# Patient Record
Sex: Male | Born: 1980 | Race: Black or African American | Hispanic: No | Marital: Married | Smoking: Former smoker
Health system: Southern US, Community
[De-identification: ages and names within clinical notes are randomized; demographics above are authoritative.]

## PROBLEM LIST (undated history)

## (undated) DIAGNOSIS — I119 Hypertensive heart disease without heart failure: Secondary | ICD-10-CM

## (undated) DIAGNOSIS — E785 Hyperlipidemia, unspecified: Secondary | ICD-10-CM

## (undated) DIAGNOSIS — I43 Cardiomyopathy in diseases classified elsewhere: Secondary | ICD-10-CM

## (undated) DIAGNOSIS — I10 Essential (primary) hypertension: Secondary | ICD-10-CM

## (undated) DIAGNOSIS — I77810 Thoracic aortic ectasia: Secondary | ICD-10-CM

## (undated) DIAGNOSIS — R7303 Prediabetes: Secondary | ICD-10-CM

## (undated) HISTORY — PX: HERNIA REPAIR: SHX51

## (undated) HISTORY — DX: Thoracic aortic ectasia: I77.810

## (undated) HISTORY — DX: Prediabetes: R73.03

---
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
## 2000-11-17 ENCOUNTER — Emergency Department (HOSPITAL_COMMUNITY): Admission: EM | Admit: 2000-11-17 | Discharge: 2000-11-17 | Payer: Self-pay | Admitting: Emergency Medicine

## 2000-11-17 ENCOUNTER — Encounter: Payer: Self-pay | Admitting: Emergency Medicine

## 2003-02-11 ENCOUNTER — Emergency Department (HOSPITAL_COMMUNITY): Admission: EM | Admit: 2003-02-11 | Discharge: 2003-02-11 | Payer: Self-pay | Admitting: Emergency Medicine

## 2004-02-03 ENCOUNTER — Emergency Department (HOSPITAL_COMMUNITY): Admission: EM | Admit: 2004-02-03 | Discharge: 2004-02-03 | Payer: Self-pay | Admitting: Emergency Medicine

## 2004-09-02 ENCOUNTER — Emergency Department (HOSPITAL_COMMUNITY): Admission: EM | Admit: 2004-09-02 | Discharge: 2004-09-02 | Payer: Self-pay | Admitting: Emergency Medicine

## 2004-12-21 ENCOUNTER — Emergency Department (HOSPITAL_COMMUNITY): Admission: EM | Admit: 2004-12-21 | Discharge: 2004-12-21 | Payer: Self-pay | Admitting: Emergency Medicine

## 2006-11-28 ENCOUNTER — Emergency Department (HOSPITAL_COMMUNITY): Admission: EM | Admit: 2006-11-28 | Discharge: 2006-11-29 | Payer: Self-pay | Admitting: Emergency Medicine

## 2007-02-14 ENCOUNTER — Ambulatory Visit (HOSPITAL_BASED_OUTPATIENT_CLINIC_OR_DEPARTMENT_OTHER): Admission: RE | Admit: 2007-02-14 | Discharge: 2007-02-14 | Payer: Self-pay | Admitting: Surgery

## 2008-08-29 ENCOUNTER — Emergency Department (HOSPITAL_COMMUNITY): Admission: EM | Admit: 2008-08-29 | Discharge: 2008-08-29 | Payer: Self-pay | Admitting: Emergency Medicine

## 2008-09-02 ENCOUNTER — Emergency Department (HOSPITAL_COMMUNITY): Admission: EM | Admit: 2008-09-02 | Discharge: 2008-09-02 | Payer: Self-pay | Admitting: Emergency Medicine

## 2009-03-22 ENCOUNTER — Emergency Department (HOSPITAL_COMMUNITY): Admission: EM | Admit: 2009-03-22 | Discharge: 2009-03-22 | Payer: Self-pay | Admitting: Emergency Medicine

## 2009-11-07 ENCOUNTER — Emergency Department (HOSPITAL_COMMUNITY): Admission: EM | Admit: 2009-11-07 | Discharge: 2009-11-07 | Payer: Self-pay | Admitting: Emergency Medicine

## 2009-11-10 ENCOUNTER — Ambulatory Visit: Payer: Self-pay | Admitting: Internal Medicine

## 2009-11-10 DIAGNOSIS — N410 Acute prostatitis: Secondary | ICD-10-CM | POA: Insufficient documentation

## 2009-11-10 DIAGNOSIS — I10 Essential (primary) hypertension: Secondary | ICD-10-CM | POA: Insufficient documentation

## 2009-11-10 LAB — CONVERTED CEMR LAB
AST: 23 units/L (ref 0–37)
Albumin: 4.3 g/dL (ref 3.5–5.2)
Alkaline Phosphatase: 80 units/L (ref 39–117)
Basophils Absolute: 0.1 10*3/uL (ref 0.0–0.1)
Bilirubin, Direct: 0.1 mg/dL (ref 0.0–0.3)
Calcium: 9.3 mg/dL (ref 8.4–10.5)
Creatinine, Ser: 1.1 mg/dL (ref 0.4–1.5)
Eosinophils Absolute: 0.3 10*3/uL (ref 0.0–0.7)
GFR calc non Af Amer: 102.54 mL/min (ref >60)
Hemoglobin: 16.4 g/dL (ref 13.0–17.0)
Ketones, ur: NEGATIVE mg/dL
Lymphocytes Relative: 30.6 % (ref 12.0–46.0)
MCHC: 34.4 g/dL (ref 30.0–36.0)
Monocytes Relative: 12.3 % — ABNORMAL HIGH (ref 3.0–12.0)
Neutro Abs: 2.6 10*3/uL (ref 1.4–7.7)
Neutrophils Relative %: 50.3 % (ref 43.0–77.0)
RBC: 5.17 M/uL (ref 4.22–5.81)
RDW: 12.5 % (ref 11.5–14.6)
Sodium: 142 meq/L (ref 135–145)
Specific Gravity, Urine: 1.015 (ref 1.000–1.030)
Urine Glucose: NEGATIVE mg/dL
pH: 6 (ref 5.0–8.0)

## 2010-01-19 ENCOUNTER — Ambulatory Visit: Payer: Self-pay | Admitting: Internal Medicine

## 2011-03-07 LAB — GC/CHLAMYDIA PROBE AMP, GENITAL
Chlamydia, DNA Probe: NEGATIVE
GC Probe Amp, Genital: NEGATIVE

## 2011-04-21 NOTE — Op Note (Signed)
Steve Carrillo, Steve Carrillo                ACCOUNT NO.:  [id number]   MEDICAL RECORD NO.:  [id number]          PATIENT TYPE:  AMB   LOCATION:  DSC                          FACILITY:  [facility]   PHYSICIAN:  Thomas A. Cornett, M.D.DATE OF BIRTH:  1981-01-09   DATE OF PROCEDURE:  02/14/2007  DATE OF DISCHARGE:  02/14/2007                               OPERATIVE REPORT   PREOPERATIVE DIAGNOSIS:  Right inguinal scrotal hernia.   POSTOPERATIVE DIAGNOSIS:  Right inguinal scrotal hernia.   OPERATION/PROCEDURE:  Repair of right inguinal scrotal hernia with mesh.   SURGEON:  Harriette Bouillon, M.D.   ANESTHESIA:  LMA with approximately 40 mL of 0.25% Sensorcaine local.   ESTIMATED BLOOD LOSS:  20 mL.   SPECIMENS:  None.   DRAINS:  None.   INDICATIONS FOR PROCEDURE:  The patient is 30 year old male with a very  large reducible inguinal scrotal hernia.  He presents today for repair  after receiving informed consent from him.   DESCRIPTION OF PROCEDURE:  The patient was brought to the operating room  and placed supine.  After induction of general anesthesia, the right  inguinal region was prepped and draped in a sterile fashion.  A right  groin oblique incision was made and dissection was carried down through  the subcutaneous tissue to the aponeurosis of the external oblique.  This was identified and infiltrated with additional 0.25% Sensorcaine.  It was opened in the direction of its fibers to expose the cord  structures.  The patient had a significantly large ilioinguinal nerve.  This was identified.  I was able to dissect of the cord structures and  sac.  A half inch Penrose drain was placed around these.  I was then  able to dissect the sac off of the cord structures.  The sac extended  well down into the scrotal sac.  I was able to pull up the testicle to  do this and then replaced testicle back in the scrotum without  difficulty.  Once the entire sac was dissected out, there was some  omentum in this.  I was able to manually reduce this all back into the  abdominal cavity without difficulty.  I then used a large Prolene hernia  system placing the inner leaflet in the preperitoneal space, inlaying  the [place] flat on the floor of the inguinal canal.  I secured the mesh  with a 0 Vicryl to the pubic tubercle and the bridge portion of the mesh  to the internal oblique.  #1 Novofils were used to secure the mesh to  the floor of the inguinal canal.  A small slit was cut for the cord  structures and this was closed using #1 Novofil as well.  Once the mesh  was secured, I noticed the ilioinguinal nerve was lying up against the  suture and I was unable to move this suture  satisfactorily, so that I  elected to divide the ilioinguinal nerve given the fact that I knew he  would probably have some pain given the way the mesh and nerve laid  together with the sutures around  it.  At this point in time irrigation  was used and suctioned out.  I closed the aponeurosis of the external  oblique with a 2-  0 Vicryl and 3-0 Vicryl was used to close Scarpa's fascia and 4-0  Monocryl was used to close the skin in a subcuticular fashion.  All  final counts of sponge, needle and instruments were found be correct in  this portion case.  Sterile dressings were applied.  The patient was  awakened and taken to recovery in satisfactory condition.      Thomas A. Cornett, M.D.  Electronically Signed     TAC/MEDQ  D:  02/14/2007  T:  02/16/2007  Job:  [id number]

## 2011-10-30 ENCOUNTER — Emergency Department (HOSPITAL_COMMUNITY)
Admission: EM | Admit: 2011-10-30 | Discharge: 2011-10-31 | Disposition: A | Discharge status: Home / Self Care | Payer: BC Managed Care – PPO | Attending: Emergency Medicine | Admitting: Emergency Medicine

## 2011-10-30 DIAGNOSIS — T148XXA Other injury of unspecified body region, initial encounter: Secondary | ICD-10-CM

## 2011-10-30 DIAGNOSIS — M25519 Pain in unspecified shoulder: Secondary | ICD-10-CM

## 2011-10-30 DIAGNOSIS — I10 Essential (primary) hypertension: Secondary | ICD-10-CM | POA: Insufficient documentation

## 2011-10-30 DIAGNOSIS — X503XXA Overexertion from repetitive movements, initial encounter: Secondary | ICD-10-CM | POA: Insufficient documentation

## 2011-10-30 HISTORY — DX: Essential (primary) hypertension: I10

## 2011-10-30 NOTE — ED Notes (Signed)
Pt complains of right shoulder pain that radiates down his arm, he describes it as numb and throbbing

## 2011-10-31 ENCOUNTER — Encounter (HOSPITAL_COMMUNITY): Payer: Self-pay | Admitting: Emergency Medicine

## 2011-10-31 ENCOUNTER — Other Ambulatory Visit: Payer: Self-pay

## 2011-10-31 MED ORDER — HYDROCHLOROTHIAZIDE 25 MG PO TABS: 12.5000 mg | ORAL_TABLET | Freq: Every day | ORAL | Status: DC

## 2011-10-31 MED ORDER — HYDROCODONE-ACETAMINOPHEN 5-325 MG PO TABS: 2.0000 | ORAL_TABLET | ORAL | Status: AC | PRN

## 2011-10-31 MED ORDER — HYDROCODONE-ACETAMINOPHEN 5-325 MG PO TABS
1.0000 | ORAL_TABLET | Freq: Once | ORAL | Status: AC
  Administered 2011-10-31: 1 via ORAL
  Filled 2011-10-31: qty 1

## 2011-10-31 NOTE — ED Provider Notes (Signed)
History     CSN: [id number] Arrival date & time: 10/30/2011  5:27 PM   First MD Initiated Contact with Patient 10/31/11 0014      Chief Complaint  Patient presents with  . Shoulder Pain    HPI  History provided by the patient and significant other. Patient complains of right shoulder pain radiating to his biceps and elbow that began yesterday. Patient states that he was throwing a football around a lot on Saturday prior to symptoms. Denies any other trauma or injury to the arm. Pt tried using a OTC skin pain patch without much improvement.  Pain is worse with movement.  He has some tingling in arm and numbness but denies other symptoms. He denies any swelling or bruising. Pt has hx of HTN but reports not being on any medications.  Pt has no other significant PMH.     Past Medical History  Diagnosis Date  . Hypertension     History reviewed. No pertinent past surgical history.  History reviewed. No pertinent family history.  History  Substance Use Topics  . Smoking status: Not on file  . Smokeless tobacco: Not on file  . Alcohol Use:       Review of Systems  Constitutional: Negative for fever, chills and diaphoresis.  Respiratory: Negative for cough and shortness of breath.   Cardiovascular: Negative for chest pain.  Gastrointestinal: Negative for nausea and vomiting.  All other systems reviewed and are negative.    Allergies  Review of patient's allergies indicates no known allergies.  Home Medications  No current outpatient prescriptions on file.  BP 188/127  Pulse 79  Temp(Src) 98 F (36.7 C) (Oral)  Resp 20  SpO2 98%  Physical Exam  Nursing note and vitals reviewed. Constitutional: He is oriented to person, place, and time. He appears well-developed and well-nourished. No distress.  HENT:  Head: Normocephalic.  Cardiovascular: Normal rate, regular rhythm and normal heart sounds.   No murmur heard. Pulmonary/Chest: Effort normal and breath sounds  normal. He has no wheezes. He has no rales.  Abdominal: Soft.  Musculoskeletal: Normal range of motion.       Pain with extreme ranges of motion and right shoulder otherwise full. No deformity or swelling of shoulder. Normal strength in arm. Normal radial pulses and distal sensations.  Neurological: He is alert and oriented to person, place, and time. No cranial nerve deficit.  Skin: Skin is warm and dry. No rash noted. No erythema.  Psychiatric: He has a normal mood and affect. His behavior is normal.    ED Course  Procedures (including critical care time)   1. Shoulder pain   2. Muscle strain   3. Hypertension     MDM  12:15 AM patient seen and evaluated. Patient in no acute distress.  Pt with HTN on exam but has no other symptoms besides rt shoulder pain.  Will plan to give Rx for BP meds for pt to start.  Pt encouraged to follow with PCP.          Angus Seller, [place] 10/31/11 1549

## 2011-11-01 NOTE — ED Provider Notes (Signed)
Medical screening examination/treatment/procedure(s) were performed by non-physician practitioner and as supervising physician I was immediately available for consultation/collaboration.   Lyanne Co, MD 11/01/11 [phone]

## 2013-12-04 DIAGNOSIS — E785 Hyperlipidemia, unspecified: Secondary | ICD-10-CM

## 2013-12-04 HISTORY — DX: Hyperlipidemia, unspecified: E78.5

## 2014-10-04 DIAGNOSIS — I119 Hypertensive heart disease without heart failure: Secondary | ICD-10-CM

## 2014-10-04 HISTORY — DX: Hypertensive heart disease without heart failure: I11.9

## 2014-10-19 ENCOUNTER — Encounter (HOSPITAL_COMMUNITY): Payer: Self-pay | Admitting: Emergency Medicine

## 2014-10-19 ENCOUNTER — Observation Stay (HOSPITAL_COMMUNITY)
Admission: EM | Admit: 2014-10-19 | Discharge: 2014-10-25 | Disposition: A | Discharge status: Home / Self Care | Payer: BC Managed Care – PPO | Attending: Internal Medicine | Admitting: Internal Medicine

## 2014-10-19 ENCOUNTER — Emergency Department (HOSPITAL_COMMUNITY): Payer: BC Managed Care – PPO

## 2014-10-19 DIAGNOSIS — I5021 Acute systolic (congestive) heart failure: Secondary | ICD-10-CM | POA: Diagnosis not present

## 2014-10-19 DIAGNOSIS — Z6835 Body mass index (BMI) 35.0-35.9, adult: Secondary | ICD-10-CM | POA: Insufficient documentation

## 2014-10-19 DIAGNOSIS — Z87891 Personal history of nicotine dependence: Secondary | ICD-10-CM | POA: Insufficient documentation

## 2014-10-19 DIAGNOSIS — Z9114 Patient's other noncompliance with medication regimen: Secondary | ICD-10-CM | POA: Insufficient documentation

## 2014-10-19 DIAGNOSIS — I509 Heart failure, unspecified: Secondary | ICD-10-CM

## 2014-10-19 DIAGNOSIS — N179 Acute kidney failure, unspecified: Secondary | ICD-10-CM

## 2014-10-19 DIAGNOSIS — R7989 Other specified abnormal findings of blood chemistry: Secondary | ICD-10-CM | POA: Diagnosis present

## 2014-10-19 DIAGNOSIS — I110 Hypertensive heart disease with heart failure: Principal | ICD-10-CM | POA: Insufficient documentation

## 2014-10-19 DIAGNOSIS — I4581 Long QT syndrome: Secondary | ICD-10-CM | POA: Insufficient documentation

## 2014-10-19 DIAGNOSIS — I43 Cardiomyopathy in diseases classified elsewhere: Secondary | ICD-10-CM | POA: Insufficient documentation

## 2014-10-19 DIAGNOSIS — N289 Disorder of kidney and ureter, unspecified: Secondary | ICD-10-CM | POA: Diagnosis not present

## 2014-10-19 DIAGNOSIS — I5043 Acute on chronic combined systolic (congestive) and diastolic (congestive) heart failure: Secondary | ICD-10-CM

## 2014-10-19 DIAGNOSIS — I10 Essential (primary) hypertension: Secondary | ICD-10-CM | POA: Diagnosis present

## 2014-10-19 DIAGNOSIS — E669 Obesity, unspecified: Secondary | ICD-10-CM | POA: Diagnosis not present

## 2014-10-19 DIAGNOSIS — I429 Cardiomyopathy, unspecified: Secondary | ICD-10-CM

## 2014-10-19 DIAGNOSIS — R9431 Abnormal electrocardiogram [ECG] [EKG]: Secondary | ICD-10-CM | POA: Diagnosis present

## 2014-10-19 DIAGNOSIS — R0602 Shortness of breath: Secondary | ICD-10-CM

## 2014-10-19 DIAGNOSIS — I160 Hypertensive urgency: Secondary | ICD-10-CM

## 2014-10-19 HISTORY — DX: Cardiomyopathy in diseases classified elsewhere: I43

## 2014-10-19 HISTORY — DX: Hypertensive heart disease without heart failure: I11.9

## 2014-10-19 LAB — COMPREHENSIVE METABOLIC PANEL
ALT: 21 U/L (ref 0–53)
AST: 20 U/L (ref 0–37)
Albumin: 3.4 g/dL — ABNORMAL LOW (ref 3.5–5.2)
Alkaline Phosphatase: 81 U/L (ref 39–117)
Anion gap: 15 (ref 5–15)
BILIRUBIN TOTAL: 0.4 mg/dL (ref 0.3–1.2)
BUN: 16 mg/dL (ref 6–23)
CALCIUM: 9.1 mg/dL (ref 8.4–10.5)
CO2: 21 meq/L (ref 19–32)
CREATININE: 1.41 mg/dL — AB (ref 0.50–1.35)
Chloride: 102 mEq/L (ref 96–112)
GFR, EST AFRICAN AMERICAN: 75 mL/min — AB (ref >90)
GFR, EST NON AFRICAN AMERICAN: 65 mL/min — AB (ref >90)
GLUCOSE: 109 mg/dL — AB (ref 70–99)
Potassium: 4.1 mEq/L (ref 3.7–5.3)
SODIUM: 138 meq/L (ref 137–147)
Total Protein: 7.3 g/dL (ref 6.0–8.3)

## 2014-10-19 LAB — CBC
HCT: 44.3 % (ref 39.0–52.0)
Hemoglobin: 15 g/dL (ref 13.0–17.0)
MCH: 28.8 pg (ref 26.0–34.0)
MCHC: 33.9 g/dL (ref 30.0–36.0)
MCV: 85 fL (ref 78.0–100.0)
PLATELETS: 315 10*3/uL (ref 150–400)
RBC: 5.21 MIL/uL (ref 4.22–5.81)
RDW: 13.1 % (ref 11.5–15.5)
WBC: 8.7 10*3/uL (ref 4.0–10.5)

## 2014-10-19 LAB — CBC WITH DIFFERENTIAL/PLATELET
Basophils Absolute: 0 10*3/uL (ref 0.0–0.1)
Basophils Relative: 1 % (ref 0–1)
EOS ABS: 0.3 10*3/uL (ref 0.0–0.7)
EOS PCT: 5 % (ref 0–5)
HCT: 42.6 % (ref 39.0–52.0)
HEMOGLOBIN: 14.4 g/dL (ref 13.0–17.0)
LYMPHS ABS: 2.2 10*3/uL (ref 0.7–4.0)
Lymphocytes Relative: 30 % (ref 12–46)
MCH: 28.7 pg (ref 26.0–34.0)
MCHC: 33.8 g/dL (ref 30.0–36.0)
MCV: 85 fL (ref 78.0–100.0)
MONOS PCT: 5 % (ref 3–12)
Monocytes Absolute: 0.4 10*3/uL (ref 0.1–1.0)
Neutro Abs: 4.2 10*3/uL (ref 1.7–7.7)
Neutrophils Relative %: 59 % (ref 43–77)
Platelets: 299 10*3/uL (ref 150–400)
RBC: 5.01 MIL/uL (ref 4.22–5.81)
RDW: 13.1 % (ref 11.5–15.5)
WBC: 7.1 10*3/uL (ref 4.0–10.5)

## 2014-10-19 LAB — CREATININE, SERUM
CREATININE: 1.5 mg/dL — AB (ref 0.50–1.35)
GFR calc Af Amer: 70 mL/min — ABNORMAL LOW (ref >90)
GFR calc non Af Amer: 60 mL/min — ABNORMAL LOW (ref >90)

## 2014-10-19 LAB — PRO B NATRIURETIC PEPTIDE: Pro B Natriuretic peptide (BNP): 1941 pg/mL — ABNORMAL HIGH (ref 0–125)

## 2014-10-19 LAB — TROPONIN I: Troponin I: <0.3 ng/mL (ref <0.30)

## 2014-10-19 LAB — TSH: TSH: 2.07 u[IU]/mL (ref 0.350–4.500)

## 2014-10-19 LAB — MAGNESIUM: Magnesium: 2 mg/dL (ref 1.5–2.5)

## 2014-10-19 MED ORDER — HYDRALAZINE HCL 20 MG/ML IJ SOLN
10.0000 mg | Freq: Four times a day (QID) | INTRAMUSCULAR | Status: DC | PRN
  Administered 2014-10-19 – 2014-10-22 (×6): 10 mg via INTRAVENOUS
  Filled 2014-10-19 (×6): qty 1

## 2014-10-19 MED ORDER — HEPARIN SODIUM (PORCINE) 5000 UNIT/ML IJ SOLN
5000.0000 [IU] | Freq: Three times a day (TID) | INTRAMUSCULAR | Status: DC
  Administered 2014-10-19 – 2014-10-21 (×7): 5000 [IU] via SUBCUTANEOUS
  Filled 2014-10-19 (×9): qty 1

## 2014-10-19 MED ORDER — SODIUM CHLORIDE 0.9 % IJ SOLN
3.0000 mL | Freq: Two times a day (BID) | INTRAMUSCULAR | Status: DC
  Administered 2014-10-19 – 2014-10-25 (×11): 3 mL via INTRAVENOUS

## 2014-10-19 MED ORDER — IPRATROPIUM-ALBUTEROL 0.5-2.5 (3) MG/3ML IN SOLN
3.0000 mL | Freq: Once | RESPIRATORY_TRACT | Status: AC
  Administered 2014-10-19: 3 mL via RESPIRATORY_TRACT
  Filled 2014-10-19: qty 3

## 2014-10-19 MED ORDER — SODIUM CHLORIDE 0.9 % IJ SOLN
3.0000 mL | INTRAMUSCULAR | Status: DC | PRN
  Administered 2014-10-20: 3 mL via INTRAVENOUS
  Filled 2014-10-19: qty 3

## 2014-10-19 MED ORDER — ACETAMINOPHEN 325 MG PO TABS
650.0000 mg | ORAL_TABLET | ORAL | Status: DC | PRN
  Administered 2014-10-19 – 2014-10-20 (×2): 650 mg via ORAL
  Filled 2014-10-19 (×3): qty 2

## 2014-10-19 MED ORDER — FUROSEMIDE 20 MG PO TABS
20.0000 mg | ORAL_TABLET | Freq: Every day | ORAL | Status: DC
  Administered 2014-10-20 – 2014-10-21 (×2): 20 mg via ORAL
  Filled 2014-10-19 (×2): qty 1

## 2014-10-19 MED ORDER — ALBUTEROL SULFATE (2.5 MG/3ML) 0.083% IN NEBU
2.5000 mg | INHALATION_SOLUTION | Freq: Once | RESPIRATORY_TRACT | Status: AC
  Administered 2014-10-19: 2.5 mg via RESPIRATORY_TRACT
  Filled 2014-10-19: qty 3

## 2014-10-19 MED ORDER — TRAMADOL HCL 50 MG PO TABS
50.0000 mg | ORAL_TABLET | Freq: Four times a day (QID) | ORAL | Status: DC | PRN
  Administered 2014-10-19 – 2014-10-22 (×6): 50 mg via ORAL
  Filled 2014-10-19 (×7): qty 1

## 2014-10-19 MED ORDER — SODIUM CHLORIDE 0.9 % IV SOLN: 250.0000 mL | INTRAVENOUS | Status: DC | PRN

## 2014-10-19 MED ORDER — CARVEDILOL 6.25 MG PO TABS
6.2500 mg | ORAL_TABLET | Freq: Two times a day (BID) | ORAL | Status: DC
  Administered 2014-10-19 – 2014-10-23 (×8): 6.25 mg via ORAL
  Filled 2014-10-19 (×10): qty 1

## 2014-10-19 MED ORDER — FUROSEMIDE 10 MG/ML IJ SOLN
40.0000 mg | Freq: Once | INTRAMUSCULAR | Status: AC
  Administered 2014-10-19: 40 mg via INTRAVENOUS
  Filled 2014-10-19: qty 4

## 2014-10-19 NOTE — ED Notes (Signed)
Pt sitting up in bed, family at bedside, in NAD

## 2014-10-19 NOTE — H&P (Addendum)
[facility] History and Physical  Job FoundsScottie Steve Cinque [id number] DOB: 13-Sep-1981 DOA: 10/19/2014  Referring physician: Dr. Estell HarpinZammit PCP: Sanda Lingerhomas Jones, MD   Chief Complaint: SOB  HPI: Steve Shelton SilvasJ Carrillo is a 33 y.o. male  With prior history of hypertension. Taking his medications 2 years ago. Presents to the emergency department complaining of 1 day of shortness of breath. Worse with activity. He denies any sick contacts. Denies any productive cough. At this moment patient is unsure of anything that makes it better or worse. Problem since onset has been persistent and gradually getting worse.  Patient presented to the ED for further evaluation and recommendations.while in the ED was found to have elevated serum creatinine of 1.4, normal troponin level, but elevated BNP of 1941. Initial evaluation would also show an elevated blood pressure with last one while in the ED at 176/121. We were consulted for further medical evaluation recommendations.   Review of Systems:  Constitutional:  No weight loss, night sweats, Fevers, chills, fatigue.  HEENT:  No headaches, Difficulty swallowing,Tooth/dental problems,Sore throat,  No sneezing, itching, ear ache, nasal congestion, post nasal drip,  Cardio-vascular:  No chest pain, Orthopnea, PND, anasarca, dizziness, palpitations  GI:  No heartburn, indigestion, abdominal pain, nausea, vomiting, diarrhea, change in bowel habits, loss of appetite  Resp:  + shortness of breath with exertion or at rest. No excess mucus, no productive cough, No non-productive cough, No coughing up of blood.No change in color of mucus.No wheezing.No chest wall deformity  Skin:  no rash or lesions.  GU:  no dysuria, change in color of urine, no urgency or frequency. No flank pain.  Musculoskeletal:  No joint pain or swelling. No decreased range of motion. No back pain.  Psych:  No change in mood or affect. No depression or anxiety. No memory loss.   Past Medical  History  Diagnosis Date  . Hypertension    Past Surgical History  Procedure Laterality Date  . Hernia repair     Social History:  reports that he quit smoking about 5 weeks ago. He has never used smokeless tobacco. He reports that he drinks alcohol. He reports that he does not use illicit drugs.  No Known Allergies  Family history - none reported when directly asked  Prior to Admission medications   Medication Sig Start Date End Date Taking? Authorizing Provider  hydrochlorothiazide (HYDRODIURIL) 25 MG tablet Take 0.5 tablets (12.5 mg total) by mouth daily. 10/31/11 10/30/12  Angus SellerPeter S Dammen, PA-C   Physical Exam: Filed Vitals:   10/19/14 1205 10/19/14 1315 10/19/14 1334 10/19/14 1408  BP: 200/147  181/132 176/121  Pulse: 116 114  111  Temp: 98.9 F (37.2 C)   98.4 F (36.9 C)  TempSrc: Oral   Oral  Resp: 29  27 19   SpO2: 95% 96% 97% 94%    Wt Readings from Last 3 Encounters:  11/10/09 122.925 kg (271 lb)    General:  Appears calm and comfortable Eyes: PERRL, normal lids, irises & conjunctiva ENT: grossly normal hearing, lips & tongue Neck: no LAD, masses or thyromegaly Cardiovascular: RRR, S 3 gallop present, no murmurs. Telemetry: S tachycardia, no arrhythmias  Respiratory: CTA bilaterally, decreased breath sounds at bases Abdomen: soft, nt, nd Skin: no rash or induration seen on limited exam Musculoskeletal: grossly normal tone BUE/BLE Psychiatric: grossly normal mood and affect, speech fluent and appropriate Neurologic: grossly non-focal.          Labs on Admission:  Basic Metabolic Panel:  Recent Labs  Lab 10/19/14 1038  NA 138  K 4.1  CL 102  CO2 21  GLUCOSE 109*  BUN 16  CREATININE 1.41*  CALCIUM 9.1   Liver Function Tests:  Recent Labs Lab 10/19/14 1038  AST 20  ALT 21  ALKPHOS 81  BILITOT 0.4  PROT 7.3  ALBUMIN 3.4*   No results for input(s): LIPASE, AMYLASE in the last 168 hours. No results for input(s): AMMONIA in the last 168  hours. CBC:  Recent Labs Lab 10/19/14 1038  WBC 7.1  NEUTROABS 4.2  HGB 14.4  HCT 42.6  MCV 85.0  PLT 299   Cardiac Enzymes:  Recent Labs Lab 10/19/14 1038  TROPONINI <0.30    BNP (last 3 results)  Recent Labs  10/19/14 1038  PROBNP 1941.0*   CBG: No results for input(s): GLUCAP in the last 168 hours.  Radiological Exams on Admission: Dg Chest 2 View  10/19/2014   CLINICAL DATA:  One week history of cough, chest congestion, shortness of breath and mid chest pain. Former smoker who quit approximately 5 weeks ago. Current history of hypertension.  EXAM: CHEST  2 VIEW  COMPARISON:  08/29/2008.  FINDINGS: Moderate cardiac enlargement, with significant increase in heart size since 2009. Moderate diffuse interstitial pulmonary edema. No confluent airspace consolidation. No pleural effusions. Visualized bony thorax intact.  IMPRESSION: CHF and/or fluid overload, with moderate cardiomegaly and moderate diffuse interstitial pulmonary edema. (Heart size has significantly increased since 2009).   Electronically Signed   By: Hulan Saashomas  Lawrence M.D.   On: 10/19/2014 11:36    EKG: Independently reviewed. Sinus tachycardia with QT prolongation  Assessment/Plan Principal Problem:   Acute CHF - will obtain troponins  every 6 hours 3 - telemetry monitoring - Echocardiogram - Lasix 40 mg by mouth daily - Heart healthy diet with 1500 mL fluid restriction, daily weights, intake and output - chest x-ray results reporting CHF and/or fluid overload with moderate cardiomegaly and moderate diffuse interstitial pulmonary edema  Active Problems:   Hypertensive urgency - will start patient on beta blocker - Reassess - hydralazine when necessary elevated blood pressure    Elevated serum creatinine - unsure whether patient has chronic kidney disease or if this is an acute problem. We'll continue to monitor serum creatinine  Prolonged QT interval on EKG - We'll avoid indications a prolonged  QT interval (Zofran) - Repeat EKG next a.m. - Obtain magnesium levels   Code Status:  DVT Prophylaxis: heparin Family Communication:  Disposition Plan:   Time spent: > 40 minutes  Penny PiaVEGA, Chalee Hirota [facility] Pager [phone]

## 2014-10-19 NOTE — Plan of Care (Signed)
Problem: Consults Goal: Heart Failure Patient Education (See Patient Education module for education specifics.) Outcome: Progressing  Comments:  CHF information given to patient.

## 2014-10-19 NOTE — Progress Notes (Signed)
10/19/14  1745  Paged Dr Cena BentonVega regarding BP 198/126.  MD aware hydralazine 10mg  and Tylenol 650mg  were given around 1630 with no change in BP or headache.  Coreg 6.25mg  was given around 1740.  Per MD wait one hour after Coreg to see if any changes in BP, if no change call MD. Will continue to monitor.

## 2014-10-19 NOTE — ED Provider Notes (Addendum)
CSN: [id number]     Arrival date & time 10/19/14  [id number] History   First MD Initiated Contact with Patient 10/19/14 1008     Chief Complaint  Patient presents with  . Shortness of Breath     (Consider location/radiation/quality/duration/timing/severity/associated sxs/prior Treatment) Patient is a 33 y.o. male presenting with shortness of breath. The history is provided by the patient (the pt complains of cough and sob for one week).  Shortness of Breath Severity:  Moderate Onset quality:  Gradual Timing:  Constant Progression:  Waxing and waning Chronicity:  New Context: activity   Associated symptoms: wheezing   Associated symptoms: no abdominal pain, no chest pain, no cough, no headaches and no rash     Past Medical History  Diagnosis Date  . Hypertension    History reviewed. No pertinent past surgical history. No family history on file. History  Substance Use Topics  . Smoking status: Former Smoker    Quit date: 09/14/2014  . Smokeless tobacco: Not on file  . Alcohol Use: Not on file    Review of Systems  Constitutional: Negative for appetite change and fatigue.  HENT: Negative for congestion, ear discharge and sinus pressure.   Eyes: Negative for discharge.  Respiratory: Positive for wheezing. Negative for cough.   Cardiovascular: Negative for chest pain.  Gastrointestinal: Negative for abdominal pain and diarrhea.  Genitourinary: Negative for frequency and hematuria.  Musculoskeletal: Negative for back pain.  Skin: Negative for rash.  Neurological: Negative for seizures and headaches.  Psychiatric/Behavioral: Negative for hallucinations.      Allergies  Review of patient's allergies indicates no known allergies.  Home Medications   Prior to Admission medications   Medication Sig Start Date End Date Taking? Authorizing Provider  hydrochlorothiazide (HYDRODIURIL) 25 MG tablet Take 0.5 tablets (12.5 mg total) by mouth daily. 10/31/11 10/30/12  Peter S  Dammen, PA-C   BP 181/132 mmHg  Pulse 114  Temp(Src) 98.9 F (37.2 C) (Oral)  Resp 27  SpO2 97% Physical Exam  Constitutional: He is oriented to person, place, and time. He appears well-developed.  HENT:  Head: Normocephalic.  Eyes: Conjunctivae and EOM are normal. No scleral icterus.  Neck: Neck supple. No thyromegaly present.  Cardiovascular: Normal rate and regular rhythm.  Exam reveals no gallop and no friction rub.   No murmur heard. Pulmonary/Chest: No stridor. He has no wheezes. He has no rales. He exhibits no tenderness.  Abdominal: He exhibits no distension. There is no tenderness. There is no rebound.  Musculoskeletal: Normal range of motion. He exhibits no edema.  Lymphadenopathy:    He has no cervical adenopathy.  Neurological: He is oriented to person, place, and time. He exhibits normal muscle tone. Coordination normal.  Skin: No rash noted. No erythema.  Psychiatric: He has a normal mood and affect. His behavior is normal.    ED Course  Procedures (including critical care time) Labs Review Labs Reviewed  COMPREHENSIVE METABOLIC PANEL - Abnormal; Notable for the following:    Glucose, Bld 109 (*)    Creatinine, Ser 1.41 (*)    Albumin 3.4 (*)    GFR calc non Af Amer 65 (*)    GFR calc Af Amer 75 (*)    All other components within normal limits  PRO B NATRIURETIC PEPTIDE - Abnormal; Notable for the following:    Pro B Natriuretic peptide (BNP) 1941.0 (*)    All other components within normal limits  CBC WITH DIFFERENTIAL  TROPONIN I    Imaging  Review Dg Chest 2 View  10/19/2014   CLINICAL DATA:  One week history of cough, chest congestion, shortness of breath and mid chest pain. Former smoker who quit approximately 5 weeks ago. Current history of hypertension.  EXAM: CHEST  2 VIEW  COMPARISON:  08/29/2008.  FINDINGS: Moderate cardiac enlargement, with significant increase in heart size since 2009. Moderate diffuse interstitial pulmonary edema. No confluent  airspace consolidation. No pleural effusions. Visualized bony thorax intact.  IMPRESSION: CHF and/or fluid overload, with moderate cardiomegaly and moderate diffuse interstitial pulmonary edema. (Heart size has significantly increased since 2009).   Electronically Signed   By: Hulan Saashomas  Lawrence M.D.   On: 10/19/2014 11:36     EKG Interpretation   Date/Time:  Monday October 19 2014 10:30:49 EST Ventricular Rate:  110 PR Interval:  162 QRS Duration: 90 QT Interval:  378 QTC Calculation: 511 R Axis:   69 Text Interpretation:  Sinus tachycardia LAE, consider biatrial enlargement  Probable left ventricular hypertrophy Prolonged QT interval Confirmed by  Ryzen Deady  MD, Cerina Leary [phone]([id number]) on 10/19/2014 1:59:40 PM      MDM   Final diagnoses:  SOB (shortness of breath)  Acute systolic congestive heart failure    Chf,   Admit      Benny LennertJoseph L Chyrel Taha, MD 10/19/14 1401  Benny LennertJoseph L Siniyah Evangelist, MD 11/02/14 1536  Benny LennertJoseph L Laysa Kimmey, MD 11/02/14 1537

## 2014-10-19 NOTE — ED Notes (Addendum)
Pt c/o SOB that started last week along with a dry cough. Pt denies n/v. Pt states he just quit smoking about 5 weeks ago. Pt also is out of BP meds.

## 2014-10-20 DIAGNOSIS — I110 Hypertensive heart disease with heart failure: Secondary | ICD-10-CM | POA: Diagnosis not present

## 2014-10-20 DIAGNOSIS — I43 Cardiomyopathy in diseases classified elsewhere: Secondary | ICD-10-CM | POA: Diagnosis not present

## 2014-10-20 DIAGNOSIS — I5021 Acute systolic (congestive) heart failure: Secondary | ICD-10-CM | POA: Diagnosis not present

## 2014-10-20 DIAGNOSIS — I359 Nonrheumatic aortic valve disorder, unspecified: Secondary | ICD-10-CM

## 2014-10-20 DIAGNOSIS — N289 Disorder of kidney and ureter, unspecified: Secondary | ICD-10-CM | POA: Diagnosis not present

## 2014-10-20 LAB — BASIC METABOLIC PANEL
Anion gap: 14 (ref 5–15)
BUN: 18 mg/dL (ref 6–23)
CO2: 23 meq/L (ref 19–32)
Calcium: 9.6 mg/dL (ref 8.4–10.5)
Chloride: 100 mEq/L (ref 96–112)
Creatinine, Ser: 1.34 mg/dL (ref 0.50–1.35)
GFR calc Af Amer: 80 mL/min — ABNORMAL LOW (ref >90)
GFR calc non Af Amer: 69 mL/min — ABNORMAL LOW (ref >90)
GLUCOSE: 112 mg/dL — AB (ref 70–99)
POTASSIUM: 3.6 meq/L — AB (ref 3.7–5.3)
SODIUM: 137 meq/L (ref 137–147)

## 2014-10-20 LAB — TROPONIN I
Troponin I: <0.3 ng/mL (ref <0.30)
Troponin I: <0.3 ng/mL (ref <0.30)

## 2014-10-20 MED ORDER — ISOSORB DINITRATE-HYDRALAZINE 20-37.5 MG PO TABS
1.0000 | ORAL_TABLET | Freq: Three times a day (TID) | ORAL | Status: DC
  Administered 2014-10-20 – 2014-10-25 (×15): 1 via ORAL
  Filled 2014-10-20 (×18): qty 1

## 2014-10-20 NOTE — Progress Notes (Signed)
  Echocardiogram 2D Echocardiogram has been performed.  Steve Carrillo 10/20/2014, 11:30 AM

## 2014-10-20 NOTE — Progress Notes (Signed)
[facility] PROGRESS NOTE  Steve FoundsScottie [name] ZOX:[id number] DOB: 1981-06-22 DOA: 10/19/2014 PCP: Sanda Lingerhomas Jones, MD  Assessment/Plan: Principal Problem:  Acute CHF - Troponins negative x 3 - telemetry monitoring - Echocardiogram results pending - Lasix 40 mg by mouth daily - Heart healthy diet with 1500 mL fluid restriction, daily weights, intake and output - chest x-ray results reporting CHF and/or fluid overload with moderate cardiomegaly and moderate diffuse interstitial pulmonary edema - weight down from admission value  Active Problems:  Hypertensive urgency - will continue patient on beta blocker - hydralazine when necessary elevated blood pressure - Add Bidil 10/20/14   Elevated serum creatinine - unsure whether patient has chronic kidney disease or if this is an acute problem. We'll continue to monitor serum creatinine - currently trending down. May consider adding ACEI or ARB should value continue to improve.  Prolonged QT interval on EKG - We'll avoid medications that prolonged QT interval ( like Zofran) - Magnesium levels within normal limits.  Code Status: Full Family Communication: None at bedside Disposition Plan: Pending continued improvement in condition.   Consultants:  None  Procedures:  Echocardiogram: P  Antibiotics:  None  HPI/Subjective: Pt has no new complaints. States that his breathing status is improved. Still having some headaches on and off but denies any blurred vision or abdominal discomfort.  Objective: Filed Vitals:   10/20/14 0950  BP: 162/100  Pulse: 102  Temp:   Resp:     Intake/Output Summary (Last 24 hours) at 10/20/14 1252 Last data filed at 10/20/14 0800  Gross per 24 hour  Intake    950 ml  Output      0 ml  Net    950 ml   Filed Weights   10/19/14 1700 10/20/14 0622  Weight: 122.108 kg (269 lb 3.2 oz) 121.927 kg (268 lb 12.8 oz)    Exam:   General:  Patient in no acute distress, alert and  awake  Cardiovascular: regular rate and rhythm, no murmurs or rubs  Respiratory: clear to auscultation bilaterally, no wheezes  Abdomen: abdomen soft, nondistended, no guarding  Musculoskeletal: no cyanosis or clubbing   Data Reviewed: Basic Metabolic Panel:  Recent Labs Lab 10/19/14 1038 10/19/14 1750 10/20/14 0436  NA 138  --  137  K 4.1  --  3.6*  CL 102  --  100  CO2 21  --  23  GLUCOSE 109*  --  112*  BUN 16  --  18  CREATININE 1.41* 1.50* 1.34  CALCIUM 9.1  --  9.6  MG  --  2.0  --    Liver Function Tests:  Recent Labs Lab 10/19/14 1038  AST 20  ALT 21  ALKPHOS 81  BILITOT 0.4  PROT 7.3  ALBUMIN 3.4*   No results for input(s): LIPASE, AMYLASE in the last 168 hours. No results for input(s): AMMONIA in the last 168 hours. CBC:  Recent Labs Lab 10/19/14 1038 10/19/14 1750  WBC 7.1 8.7  NEUTROABS 4.2  --   HGB 14.4 15.0  HCT 42.6 44.3  MCV 85.0 85.0  PLT 299 315   Cardiac Enzymes:  Recent Labs Lab 10/19/14 1038 10/19/14 1750 10/20/14 0002 10/20/14 0436  TROPONINI <0.30 <0.30 <0.30 <0.30   BNP (last 3 results)  Recent Labs  10/19/14 1038  PROBNP 1941.0*   CBG: No results for input(s): GLUCAP in the last 168 hours.  No results found for this or any previous visit (from the past 240 hour(s)).   Studies:  Dg Chest 2 View  10/19/2014   CLINICAL DATA:  One week history of cough, chest congestion, shortness of breath and mid chest pain. Former smoker who quit approximately 5 weeks ago. Current history of hypertension.  EXAM: CHEST  2 VIEW  COMPARISON:  08/29/2008.  FINDINGS: Moderate cardiac enlargement, with significant increase in heart size since 2009. Moderate diffuse interstitial pulmonary edema. No confluent airspace consolidation. No pleural effusions. Visualized bony thorax intact.  IMPRESSION: CHF and/or fluid overload, with moderate cardiomegaly and moderate diffuse interstitial pulmonary edema. (Heart size has significantly  increased since 2009).   Electronically Signed   By: Hulan Saashomas  Lawrence M.D.   On: 10/19/2014 11:36    Scheduled Meds: . carvedilol  6.25 mg Oral BID WC  . furosemide  20 mg Oral Daily  . heparin  5,000 Units Subcutaneous 3 times per day  . isosorbide-hydrALAZINE  1 tablet Oral TID  . sodium chloride  3 mL Intravenous Q12H   Continuous Infusions:    Time spent: > 35 minutes    Penny PiaVEGA, Rosanna Bickle  [facility] Pager [phone] If 7PM-7AM, please contact night-coverage at [email or web address], password [email or web address] 10/20/2014, 12:52 PM  LOS: 1 day

## 2014-10-20 NOTE — Progress Notes (Signed)
UR completed 

## 2014-10-20 NOTE — Progress Notes (Signed)
CARE MANAGEMENT NOTE 10/20/2014  Patient:  Steve Carrillo,Steve Carrillo   Account Number:  [id number]  Date Initiated:  10/20/2014  Documentation initiated by:  Lanier ClamMAHABIR,Megan Presti  Subjective/Objective Assessment:   32 Y/O M ADMITTED W/CHF.     Action/Plan:   FROM HOME.NO PCP.   Anticipated DC Date:  10/21/2014   Anticipated DC Plan:  HOME W HOME HEALTH SERVICES  In-house referral  PCP / [facility]      DC Planning Services  CM consult      Choice offered to / List presented to:             Status of service:  In process, will continue to follow Medicare Important Message given?   (If response is "NO", the following Medicare IM given date fields will be blank) Date Medicare IM given:   Medicare IM given by:   Date Additional Medicare IM given:   Additional Medicare IM given by:    Discharge Disposition:    Per UR Regulation:  Reviewed for med. necessity/level of care/duration of stay  If discussed at Long Length of Stay Meetings, dates discussed:    Comments:  10/20/14 Shantanu Strauch RN,BSN NCM [phone] PROVIDED W/PCP LISTING, INFORMED TO CHOOSE PCP, THEN WE CAN CHOOSE HHC AGENCY.PATIENT VOICED UNDERSTANDING.RECOMMEND HHRN.

## 2014-10-20 NOTE — Plan of Care (Signed)
Problem: Phase I Progression Outcomes Goal: Dyspnea controlled at rest (HF) Outcome: Completed/Met Date Met:  10/20/14 Goal: Pain controlled with appropriate interventions Outcome: Completed/Met Date Met:  10/20/14 Goal: EF % per last Echo/documented,Core Reminder form on chart Outcome: Completed/Met Date Met:  10/20/14 Goal: Up in chair, BRP Outcome: Completed/Met Date Met:  10/20/14 Goal: Voiding-avoid urinary catheter unless indicated Outcome: Completed/Met Date Met:  10/20/14 Goal: Hemodynamically stable Outcome: Completed/Met Date Met:  10/20/14  Problem: Phase II Progression Outcomes Goal: Walk in hall or up in chair TID Outcome: Completed/Met Date Met:  10/20/14 Goal: Tolerating diet Outcome: Completed/Met Date Met:  10/20/14 Goal: Fluid volume status improved Outcome: Completed/Met Date Met:  10/20/14 Goal: Case manager referral Outcome: Completed/Met Date Met:  10/20/14

## 2014-10-20 NOTE — Plan of Care (Signed)
Problem: Phase II Progression Outcomes Goal: Pain controlled Outcome: Completed/Met Date Met:  10/20/14

## 2014-10-21 ENCOUNTER — Observation Stay (HOSPITAL_COMMUNITY): Payer: BC Managed Care – PPO

## 2014-10-21 DIAGNOSIS — I4581 Long QT syndrome: Secondary | ICD-10-CM

## 2014-10-21 DIAGNOSIS — I509 Heart failure, unspecified: Secondary | ICD-10-CM

## 2014-10-21 DIAGNOSIS — I110 Hypertensive heart disease with heart failure: Principal | ICD-10-CM

## 2014-10-21 DIAGNOSIS — I10 Essential (primary) hypertension: Secondary | ICD-10-CM

## 2014-10-21 DIAGNOSIS — N289 Disorder of kidney and ureter, unspecified: Secondary | ICD-10-CM | POA: Diagnosis not present

## 2014-10-21 DIAGNOSIS — I43 Cardiomyopathy in diseases classified elsewhere: Secondary | ICD-10-CM | POA: Diagnosis not present

## 2014-10-21 DIAGNOSIS — I429 Cardiomyopathy, unspecified: Secondary | ICD-10-CM

## 2014-10-21 DIAGNOSIS — I5021 Acute systolic (congestive) heart failure: Secondary | ICD-10-CM | POA: Diagnosis not present

## 2014-10-21 DIAGNOSIS — R931 Abnormal findings on diagnostic imaging of heart and coronary circulation: Secondary | ICD-10-CM

## 2014-10-21 LAB — BASIC METABOLIC PANEL
Anion gap: 14 (ref 5–15)
BUN: 17 mg/dL (ref 6–23)
CHLORIDE: 99 meq/L (ref 96–112)
CO2: 22 mEq/L (ref 19–32)
CREATININE: 1.51 mg/dL — AB (ref 0.50–1.35)
Calcium: 9.6 mg/dL (ref 8.4–10.5)
GFR calc non Af Amer: 60 mL/min — ABNORMAL LOW (ref >90)
GFR, EST AFRICAN AMERICAN: 69 mL/min — AB (ref >90)
Glucose, Bld: 128 mg/dL — ABNORMAL HIGH (ref 70–99)
Potassium: 3.8 mEq/L (ref 3.7–5.3)
Sodium: 135 mEq/L — ABNORMAL LOW (ref 137–147)

## 2014-10-21 LAB — SODIUM, URINE, RANDOM: Sodium, Ur: 30 mEq/L

## 2014-10-21 LAB — CREATININE, URINE, RANDOM: Creatinine, Urine: 240.9 mg/dL

## 2014-10-21 MED ORDER — LISINOPRIL 20 MG PO TABS
20.0000 mg | ORAL_TABLET | Freq: Every day | ORAL | Status: DC
  Filled 2014-10-21: qty 1

## 2014-10-21 MED ORDER — ASPIRIN 81 MG PO CHEW
81.0000 mg | CHEWABLE_TABLET | ORAL | Status: AC
  Administered 2014-10-22: 81 mg via ORAL
  Filled 2014-10-21: qty 1

## 2014-10-21 MED ORDER — SODIUM CHLORIDE 0.9 % IJ SOLN: 3.0000 mL | Freq: Two times a day (BID) | INTRAMUSCULAR | Status: DC

## 2014-10-21 MED ORDER — SODIUM CHLORIDE 0.9 % IV SOLN
1.0000 mL/kg/h | INTRAVENOUS | Status: DC
  Administered 2014-10-22: 1 mL/kg/h via INTRAVENOUS

## 2014-10-21 MED ORDER — SODIUM CHLORIDE 0.9 % IV SOLN: 250.0000 mL | INTRAVENOUS | Status: DC | PRN

## 2014-10-21 MED ORDER — SODIUM CHLORIDE 0.9 % IJ SOLN: 3.0000 mL | INTRAMUSCULAR | Status: DC | PRN

## 2014-10-21 MED ORDER — FUROSEMIDE 40 MG PO TABS
40.0000 mg | ORAL_TABLET | Freq: Every day | ORAL | Status: DC
  Administered 2014-10-22 – 2014-10-25 (×4): 40 mg via ORAL
  Filled 2014-10-21 (×4): qty 1

## 2014-10-21 NOTE — Consult Note (Signed)
Reason for Consult: New onset CHF  Requesting Physician: Thompson  Cardiologist: None  HPI: This is a 33 y.o. male with a past medical history significant for HTN, noncompliant with meds, presents with a week of progressive dyspnea on exertion. CXR shows cardiomegaly and interstitial pulmonary edema and the echo shows four chamber enlargement with moderately depressed LVEF 35-40% with a suspicion of inferior wall motion abnormalities. He denies angina. Troponin normal. ECG does not support ischemic etiology, except for mildly prolonged QT. Echo did not report diastolic function, but severely abbreviated mitral deceleration time (116 ms) suggests restrictive filling/severely elevated mean LA pressure.  PMHx:  Past Medical History  Diagnosis Date  . Hypertension    Past Surgical History  Procedure Laterality Date  . Hernia repair      FAMHx: History reviewed. No pertinent family history.  SOCHx:  reports that he quit smoking about 5 weeks ago. He has never used smokeless tobacco. He reports that he drinks alcohol. He reports that he does not use illicit drugs.  ALLERGIES: No Known Allergies  ROS: Pertinent items are noted in HPI.  The patient specifically denies any chest pain at rest or with exertion,orthopnea, paroxysmal nocturnal dyspnea, syncope, palpitations, focal neurological deficits, intermittent claudication, lower extremity edema, unexplained weight gain, cough, hemoptysis or wheezing.  The patient also denies abdominal pain, nausea, vomiting, dysphagia, diarrhea, constipation, polyuria, polydipsia, dysuria, hematuria, frequency, urgency, abnormal bleeding or bruising, fever, chills, unexpected weight changes, mood swings, change in skin or hair texture, change in voice quality, auditory or visual problems, allergic reactions or rashes, new musculoskeletal complaints other than usual "aches and pains".   HOME MEDICATIONS: Prescriptions prior to admission    Medication Sig Dispense Refill Last Dose  . hydrochlorothiazide (HYDRODIURIL) 25 MG tablet Take 0.5 tablets (12.5 mg total) by mouth daily. 30 tablet 0     HOSPITAL MEDICATIONS: I have reviewed the patient's current medications. Scheduled: . carvedilol  6.25 mg Oral BID WC  . [START ON 10/22/2014] furosemide  40 mg Oral Daily  . heparin  5,000 Units Subcutaneous 3 times per day  . isosorbide-hydrALAZINE  1 tablet Oral TID  . sodium chloride  3 mL Intravenous Q12H   Continuous:   VITALS: Blood pressure 152/92, pulse 107, temperature 98.7 F (37.1 C), temperature source Oral, resp. rate 18, height 6\' 1"  (1.854 m), weight 265 lb 3.2 oz (120.294 kg), SpO2 99 %.  PHYSICAL EXAM:  General: Alert, oriented x3, no distress Head: no evidence of trauma, PERRL, EOMI, no exophtalmos or lid lag, no myxedema, no xanthelasma; normal ears, nose and oropharynx Neck: normal jugular venous pulsations and no hepatojugular reflux; brisk carotid pulses without delay and no carotid bruits Chest: clear to auscultation, no signs of consolidation by percussion or palpation, normal fremitus, symmetrical and full respiratory excursions Cardiovascular: laterally displaced apical impulse, slightly tachycardic, regular rhythm, normal first heart sound and normal second heart sound, no rubs, no murmur, +ve S3 Abdomen: no tenderness or distention, no masses by palpation, no abnormal pulsatility or arterial bruits, normal bowel sounds, no hepatosplenomegaly Extremities: no clubbing, cyanosis;  no edema; 2+ radial, ulnar and brachial pulses bilaterally; 2+ right femoral, posterior tibial and dorsalis pedis pulses; 2+ left femoral, posterior tibial and dorsalis pedis pulses; no subclavian or femoral bruits Neurological: grossly nonfocal  LABS  CBC  Recent Labs  10/19/14 1038 10/19/14 1750  WBC 7.1 8.7  NEUTROABS 4.2  --   HGB 14.4 15.0  HCT 42.6 44.3  MCV  85.0 85.0  PLT 299 315   Basic Metabolic  Panel  Recent Labs  16/09/9610/16/15 1750 10/20/14 0436 10/21/14 0555  NA  --  137 135*  K  --  3.6* 3.8  CL  --  100 99  CO2  --  23 22  GLUCOSE  --  112* 128*  BUN  --  18 17  CREATININE 1.50* 1.34 1.51*  CALCIUM  --  9.6 9.6  MG 2.0  --   --    Liver Function Tests  Recent Labs  10/19/14 1038  AST 20  ALT 21  ALKPHOS 81  BILITOT 0.4  PROT 7.3  ALBUMIN 3.4*   Cardiac Enzymes  Recent Labs  10/19/14 1750 10/20/14 0002 10/20/14 0436  TROPONINI <0.30 <0.30 <0.30   Thyroid Function Tests  Recent Labs  10/19/14 1750  TSH 2.070      IMAGING: No results found.  ECG: NSR, biatrial enlargement, possible LVH, prolonged QT  TELEMETRY: Sinus tachycardia at rest  IMPRESSION: 1. 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 Suspicion of regional wall motion abnormalities on echo, cannot exclude ischemic etiology 3. 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 Severe (malignant) HTN, need to exclude secondary causes, particularly renal artery stenosis 5. Obesity  RECOMMENDATION: 1. Renal Dopplers 2. Cardiac cath/coronary angio to exclude CAD - benefit of study outweighs risk of worsening renal insufficiency in my opinion 3. Bidil is excellent choice; avoid further beta blocker titration - he still appears slightly hypervolemic; will need ACEi/ARB, but delay until after cath 4. Increase diuretic  5. Dietary Na restriction, weight loss, begin regular aerobic exercise once compensated. 6. Consider OSA evaluation as outpatient  Time Spent Directly with Patient: 60 minutes  Thurmon FairMihai Arleth Mccullar, MD, [place] [facility] WestFACC [facility] [phone] office [phone] pager   10/21/2014, 11:33 AM

## 2014-10-21 NOTE — Progress Notes (Signed)
[facility] PROGRESS NOTE  Steve Carrillo ZOX:[id number] DOB: 1981-09-25 DOA: 10/19/2014 PCP: Sanda Lingerhomas Jones, MD  Assessment/Plan: #1 acute CHF exacerbation Questionable etiology. Likely secondary to poorly controlled/malignant hypertension. Cardiac enzymes negative 3.2-D echo with concerns of a cardiomyopathy with EF of 35-40% with diffuse hypokinesis. Clinical improvement. I/O likely not recorded accurately. Patient's current weight is 120.29 kg. Patient has been seen in consultation by cardiology and due to abnormal 2-D echo likely undergo cardiac catheterization tomorrow.Lasix dose has been increased to 40 mg daily. Continue current dose of Coreg. Continue BiDil. Cardiology following and appreciate input and recommendations.  #2 malignant hypertension/hypertensive urgency Secondary to medical noncompliance. Patient stated he ran out of his medications over the past 2 years. Some improvement with blood pressure. Continue current dose of Coreg. Lasix dose has been increased. BiDil was just started today. Follow and titrate as needed.  #3 elevated creatinine versus probable chronic kidney disease Likely secondary to poorly controlled hypertension. Will check a renal ultrasound. Check a fractional excretion of sodium. Monitor renal function with diuretics. Follow.  #4 cardiomyopathy Questionable etiology. May be secondary to malignant hypertension. 2-D echo-with a depressed ejection fraction of 35-40% with diffuse hypokinesis. Cardiac enzymes negative 3. Continue current regimen of Coreg, Lasix, BiDil. Cardiology is consulted and patient for probable cardiac catheterization tomorrow.  #5 prophylaxis Heparin for DVT prophylaxis.   Code Status: full Family Communication: updated patient, no family at bedside. Disposition Plan: home when medically stable.   Consultants:  Cardiology: Dr. Royann Shiversroitoru 10/21/2014  Procedures:  2-D echo 10/20/2014  Chest x-ray  10/19/2014    Antibiotics:  none  HPI/Subjective: Patient denies any chest pain. No shortness of breath. No complaints.  Objective: Filed Vitals:   10/21/14 0456  BP: 152/92  Pulse: 107  Temp: 98.7 F (37.1 C)  Resp: 18    Intake/Output Summary (Last 24 hours) at 10/21/14 1158 Last data filed at 10/21/14 1000  Gross per 24 hour  Intake   1130 ml  Output    300 ml  Net    830 ml   Filed Weights   10/19/14 1700 10/20/14 0622 10/21/14 0500  Weight: 122.108 kg (269 lb 3.2 oz) 121.927 kg (268 lb 12.8 oz) 120.294 kg (265 lb 3.2 oz)    Exam:   General:  NAD  Cardiovascular: RRR  Respiratory: CLEAR TO AUSCULTATION BILATERALLY.  Abdomen: soft, nontender, nondistended, positive bowel sounds.  Musculoskeletal: no clubbing cyanosis or edema.  Data Reviewed: Basic Metabolic Panel:  Recent Labs Lab 10/19/14 1038 10/19/14 1750 10/20/14 0436 10/21/14 0555  NA 138  --  137 135*  K 4.1  --  3.6* 3.8  CL 102  --  100 99  CO2 21  --  23 22  GLUCOSE 109*  --  112* 128*  BUN 16  --  18 17  CREATININE 1.41* 1.50* 1.34 1.51*  CALCIUM 9.1  --  9.6 9.6  MG  --  2.0  --   --    Liver Function Tests:  Recent Labs Lab 10/19/14 1038  AST 20  ALT 21  ALKPHOS 81  BILITOT 0.4  PROT 7.3  ALBUMIN 3.4*   No results for input(s): LIPASE, AMYLASE in the last 168 hours. No results for input(s): AMMONIA in the last 168 hours. CBC:  Recent Labs Lab 10/19/14 1038 10/19/14 1750  WBC 7.1 8.7  NEUTROABS 4.2  --   HGB 14.4 15.0  HCT 42.6 44.3  MCV 85.0 85.0  PLT 299 315   Cardiac  Enzymes:  Recent Labs Lab 10/19/14 1038 10/19/14 1750 10/20/14 0002 10/20/14 0436  TROPONINI <0.30 <0.30 <0.30 <0.30   BNP (last 3 results)  Recent Labs  10/19/14 1038  PROBNP 1941.0*   CBG: No results for input(s): GLUCAP in the last 168 hours.  No results found for this or any previous visit (from the past 240 hour(s)).   Studies: No results found.  Scheduled Meds: .  carvedilol  6.25 mg Oral BID WC  . [START ON 10/22/2014] furosemide  40 mg Oral Daily  . heparin  5,000 Units Subcutaneous 3 times per day  . isosorbide-hydrALAZINE  1 tablet Oral TID  . sodium chloride  3 mL Intravenous Q12H   Continuous Infusions:   Principal Problem:   Acute systolic CHF (congestive heart failure) Active Problems:   Hypertensive urgency   Elevated serum creatinine   Prolonged Q-T interval on ECG   Cardiomyopathy    Time spent: 35 mins    [facility] Arizona EndoscopyHOMPSON,Abdel Effinger MD [facility] Pager [phone]. If 7PM-7AM, please contact night-coverage at [email or web address], password [email or web address] 10/21/2014, 11:58 AM  LOS: 2 days

## 2014-10-21 NOTE — Care Management Note (Addendum)
    Page 1 of 2   10/23/2014     2:37:19 PM CARE MANAGEMENT NOTE 10/23/2014  Patient:  Steve Carrillo, Steve Carrillo   Account Number:  [id number]  Date Initiated:  10/20/2014  Documentation initiated by:  Dessa Phi  Subjective/Objective Assessment:   33 Y/O M ADMITTED W/CHF.     Action/Plan:   FROM HOME.NO PCP.   Anticipated DC Date:  10/21/2014   Anticipated DC Plan:  [place]  In-house referral  PCP / [place]  CM consult      [facility] Choice  HOME HEALTH   Choice offered to / List presented to:  C-1 Patient           [place].   Status of service:  Completed, signed off Medicare Important Message given?   (If response is "NO", the following Medicare IM given date fields will be blank) Date Medicare IM given:   Medicare IM given by:   Date Additional Medicare IM given:   Additional Medicare IM given by:    Discharge Disposition:  HOME/SELF CARE  Per UR Regulation:    If discussed at Long Length of Stay Meetings, dates discussed:    Comments:  Charnice Zwilling RN, BSN, MSHL, CCM  Nurse - Case Manager,  (Unit [phone]  10/23/2014 [facility]/Donna met with patient re:  [facility] RN visit co-pay $33.00 / visit.  Patient refuses services d/t cost. Specialty Mediction Review:  None identified. Dispo Plan:  Home / Self care.    10/21/14 KATHY MAHABIR RN,BSN NCM [phone] [facility] CHOSEN FOR HHC IF ORDERED.TC KRISTEN [facility] REP AWARE OF REFERRAL.AWAIT HHRN ORDER. PATIENT HAS PCP APPT-SEE F/U D/C SECTION.AWAIT [name].AWAIT FINAL HHC ORDER.  10/20/14 KATHY MAHABIR RN,BSN NCM [phone] PROVIDED W/PCP LISTING, INFORMED TO CHOOSE PCP, THEN WE CAN CHOOSE HHC AGENCY.PATIENT VOICED UNDERSTANDING.RECOMMEND HHRN.

## 2014-10-21 NOTE — Progress Notes (Signed)
UR completed 

## 2014-10-22 ENCOUNTER — Encounter (HOSPITAL_COMMUNITY)
Admission: EM | Disposition: A | Discharge status: Home / Self Care | Payer: BC Managed Care – PPO | Source: Home / Self Care | Attending: Internal Medicine

## 2014-10-22 DIAGNOSIS — N179 Acute kidney failure, unspecified: Secondary | ICD-10-CM

## 2014-10-22 DIAGNOSIS — I429 Cardiomyopathy, unspecified: Secondary | ICD-10-CM

## 2014-10-22 DIAGNOSIS — I5021 Acute systolic (congestive) heart failure: Secondary | ICD-10-CM

## 2014-10-22 HISTORY — PX: LEFT HEART CATHETERIZATION WITH CORONARY ANGIOGRAM: SHX5451

## 2014-10-22 LAB — BASIC METABOLIC PANEL
Anion gap: 12 (ref 5–15)
BUN: 17 mg/dL (ref 6–23)
CALCIUM: 9.5 mg/dL (ref 8.4–10.5)
CHLORIDE: 100 meq/L (ref 96–112)
CO2: 25 mEq/L (ref 19–32)
Creatinine, Ser: 1.63 mg/dL — ABNORMAL HIGH (ref 0.50–1.35)
GFR, EST AFRICAN AMERICAN: 63 mL/min — AB (ref >90)
GFR, EST NON AFRICAN AMERICAN: 54 mL/min — AB (ref >90)
Glucose, Bld: 109 mg/dL — ABNORMAL HIGH (ref 70–99)
Potassium: 4.4 mEq/L (ref 3.7–5.3)
Sodium: 137 mEq/L (ref 137–147)

## 2014-10-22 LAB — CBC
HCT: 43.2 % (ref 39.0–52.0)
HCT: 43.7 % (ref 39.0–52.0)
HEMOGLOBIN: 14.2 g/dL (ref 13.0–17.0)
Hemoglobin: 14.6 g/dL (ref 13.0–17.0)
MCH: 28.5 pg (ref 26.0–34.0)
MCH: 28.8 pg (ref 26.0–34.0)
MCHC: 32.9 g/dL (ref 30.0–36.0)
MCHC: 33.4 g/dL (ref 30.0–36.0)
MCV: 86.6 fL (ref 78.0–100.0)
MCV: 86.2 fL (ref 78.0–100.0)
PLATELETS: 346 10*3/uL (ref 150–400)
Platelets: 354 10*3/uL (ref 150–400)
RBC: 4.99 MIL/uL (ref 4.22–5.81)
RBC: 5.07 MIL/uL (ref 4.22–5.81)
RDW: 13.5 % (ref 11.5–15.5)
RDW: 13.4 % (ref 11.5–15.5)
WBC: 8.9 10*3/uL (ref 4.0–10.5)
WBC: 7.4 10*3/uL (ref 4.0–10.5)

## 2014-10-22 LAB — CREATININE, SERUM
Creatinine, Ser: 1.3 mg/dL (ref 0.50–1.35)
GFR, EST AFRICAN AMERICAN: 83 mL/min — AB (ref >90)
GFR, EST NON AFRICAN AMERICAN: 71 mL/min — AB (ref >90)

## 2014-10-22 LAB — PROTIME-INR
INR: 1.09 (ref 0.00–1.49)
PROTHROMBIN TIME: 14.2 s (ref 11.6–15.2)

## 2014-10-22 SURGERY — LEFT HEART CATHETERIZATION WITH CORONARY ANGIOGRAM
Anesthesia: LOCAL

## 2014-10-22 MED ORDER — HEPARIN SODIUM (PORCINE) 5000 UNIT/ML IJ SOLN
5000.0000 [IU] | Freq: Three times a day (TID) | INTRAMUSCULAR | Status: DC
  Administered 2014-10-22 – 2014-10-25 (×6): 5000 [IU] via SUBCUTANEOUS
  Filled 2014-10-22 (×11): qty 1

## 2014-10-22 MED ORDER — SODIUM CHLORIDE 0.9 % IV SOLN: 1.0000 mL/kg/h | INTRAVENOUS | Status: AC

## 2014-10-22 MED ORDER — ONDANSETRON HCL 4 MG/2ML IJ SOLN: 4.0000 mg | Freq: Four times a day (QID) | INTRAMUSCULAR | Status: DC | PRN

## 2014-10-22 MED ORDER — LABETALOL HCL 5 MG/ML IV SOLN
20.0000 mg | INTRAVENOUS | Status: DC | PRN
  Administered 2014-10-22 – 2014-10-24 (×5): 20 mg via INTRAVENOUS
  Filled 2014-10-22 (×7): qty 4

## 2014-10-22 MED ORDER — HYDRALAZINE HCL 20 MG/ML IJ SOLN: 10.0000 mg | INTRAMUSCULAR | Status: DC | PRN

## 2014-10-22 NOTE — Clinical Documentation Improvement (Signed)
Presents with Acute Systolic CHF, likely Chronic Kidney Disease is documented.   Black male  GFR's ranging from 9263 to 469 for this admission  Please clarify the likely Stage of CKD from the list below and document findings in next progress note and include in Discharge Summary.  _______CKD Stage I - GFR > OR = 90 _______CKD Stage II - GFR 60-80 _______CKD Stage III - GFR 30-59 _______CKD Stage IV - GFR 15-29 _______CKD Stage V - GFR < 15 _______ESRD (End Stage Renal Disease) _______Other condition_____________  Jessie Foothank You, Celicia Minahan T Tishawn Friedhoff ,RN Clinical Documentation Specialist:  [phone]  [facility]- Health Information Management

## 2014-10-22 NOTE — Interval H&P Note (Signed)
History and Physical Interval Note:  10/22/2014 10:29 AM  Steve Carrillo  has presented today for surgery, with the diagnosis of cp  The various methods of treatment have been discussed with the patient and family. After consideration of risks, benefits and other options for treatment, the patient has consented to  Procedure(s): LEFT HEART CATHETERIZATION WITH CORONARY ANGIOGRAM (N/A) as a surgical intervention .  The patient's history has been reviewed, patient examined, no change in status, stable for surgery.  I have reviewed the patient's chart and labs.  Questions were answered to the patient's satisfaction.   Cath Lab Visit (complete for each Cath Lab visit)  Clinical Evaluation Leading to the Procedure:   ACS: No.  Non-ACS:    Anginal Classification: CCS III  Anti-ischemic medical therapy: No Therapy  Non-Invasive Test Results: No non-invasive testing performed  Prior CABG: No previous CABG        Theron Aristaeter Livingston Asc LLCJordanMD,FACC 10/22/2014 10:29 AM

## 2014-10-22 NOTE — Progress Notes (Signed)
[facility] PROGRESS NOTE  Steve Carrillo [id number] DOB: Oct 23, 1981 DOA: 10/19/2014 PCP: Sanda Lingerhomas Jones, MD  Assessment/Plan: #1 acute CHF exacerbation Questionable etiology. Likely secondary to poorly controlled/malignant hypertension. Cardiac enzymes negative 3.2-D echo with concerns of a cardiomyopathy with EF of 35-40% with diffuse hypokinesis. Clinical improvement. I/O likely not recorded accurately. Patient's current weight is 120.38 kg. Patient has been seen in consultation by cardiology and due to abnormal 2-D echo patient to undergo cardiac catheterization today.Lasix dose has been increased to 40 mg daily. Continue current dose of Coreg and BiDil. Per cardiology posrt cath may need to add ace inhibitor.Cardiology following and appreciate input and recommendations.  #2 malignant hypertension/hypertensive urgency Secondary to medical noncompliance. Patient stated he ran out of his medications over the past 2 years. Some improvement with blood pressure. Continue current dose of Coreg. Lasix dose, BiDil.  Per cardiology post cath may need to add ACE inhibitor. Follow and titrate as needed.  #3 elevated creatinine versus probable chronic kidney disease Likely secondary to poorly controlled hypertension. Renal US nl. FeNa = 0.14%. Renal function stable. Monitor closely post cath. Monitor renal function with diuretics. Follow.  #4 Dilated cardiomyopathy Questionable etiology. May be secondary to malignant hypertension. 2-D echo-with a depressed ejection fraction of 35-40% with diffuse hypokinesis. Cardiac enzymes negative 3. Continue current regimen of Coreg, Lasix, BiDil. Cardiology has been consulted and patient for cardiac catheterization today.  #5 prophylaxis Heparin for DVT prophylaxis.   Code Status: full Family Communication: updated patient, no family at bedside. Disposition Plan: Patient for cardiac catherization today.   Consultants:  Cardiology: Dr. Royann Shiversroitoru  10/21/2014  Procedures:  2-D echo 10/20/2014  Chest x-ray 10/19/2014  Renal US 10/21/14  Antibiotics:  none  HPI/Subjective: Patient denies any chest pain. No shortness of breath. No complaints.  Objective: Filed Vitals:   10/22/14 0736  BP: 153/99  Pulse:   Temp:   Resp:     Intake/Output Summary (Last 24 hours) at 10/22/14 0815 Last data filed at 10/21/14 2227  Gross per 24 hour  Intake   1240 ml  Output    300 ml  Net    940 ml   Filed Weights   10/20/14 0622 10/21/14 0500 10/22/14 0550  Weight: 121.927 kg (268 lb 12.8 oz) 120.294 kg (265 lb 3.2 oz) 120.385 kg (265 lb 6.4 oz)    Exam:   General:  NAD  Cardiovascular: RRR  Respiratory: CTAB  Abdomen: soft, nontender, nondistended, positive bowel sounds.  Musculoskeletal: no clubbing cyanosis or edema.  Data Reviewed: Basic Metabolic Panel:  Recent Labs Lab 10/19/14 1038 10/19/14 1750 10/20/14 0436 10/21/14 0555 10/22/14 0508  NA 138  --  137 135* 137  K 4.1  --  3.6* 3.8 4.4  CL 102  --  100 99 100  CO2 21  --  23 22 25   GLUCOSE 109*  --  112* 128* 109*  BUN 16  --  18 17 17   CREATININE 1.41* 1.50* 1.34 1.51* 1.63*  CALCIUM 9.1  --  9.6 9.6 9.5  MG  --  2.0  --   --   --    Liver Function Tests:  Recent Labs Lab 10/19/14 1038  AST 20  ALT 21  ALKPHOS 81  BILITOT 0.4  PROT 7.3  ALBUMIN 3.4*   No results for input(s): LIPASE, AMYLASE in the last 168 hours. No results for input(s): AMMONIA in the last 168 hours. CBC:  Recent Labs Lab 10/19/14 1038 10/19/14 1750  WBC  7.1 8.7  NEUTROABS 4.2  --   HGB 14.4 15.0  HCT 42.6 44.3  MCV 85.0 85.0  PLT 299 315   Cardiac Enzymes:  Recent Labs Lab 10/19/14 1038 10/19/14 1750 10/20/14 0002 10/20/14 0436  TROPONINI <0.30 <0.30 <0.30 <0.30   BNP (last 3 results)  Recent Labs  10/19/14 1038  PROBNP 1941.0*   CBG: No results for input(s): GLUCAP in the last 168 hours.  No results found for this or any previous visit  (from the past 240 hour(s)).   Studies: [place] Renal  10/21/2014   CLINICAL DATA:  Acute renal failure.  EXAM: RENAL/URINARY TRACT ULTRASOUND COMPLETE  COMPARISON:  None.  FINDINGS: Right Kidney:  Length: 12.9 cm. Echogenicity within normal limits. No mass or hydronephrosis visualized.  Left Kidney:  Length: 12.7 cm. Echogenicity within normal limits. No mass or hydronephrosis visualized.  Bladder:  Not well visualized as it is decompressed.  IMPRESSION: Normal renal ultrasound.   Electronically Signed   By: Roque LiasJames  Green M.D.   On: 10/21/2014 13:16    Scheduled Meds: . carvedilol  6.25 mg Oral BID WC  . furosemide  40 mg Oral Daily  . heparin  5,000 Units Subcutaneous 3 times per day  . isosorbide-hydrALAZINE  1 tablet Oral TID  . sodium chloride  3 mL Intravenous Q12H  . sodium chloride  3 mL Intravenous Q12H   Continuous Infusions: . sodium chloride 1 mL/kg/hr (10/22/14 0615)    Principal Problem:   Acute systolic CHF (congestive heart failure) Active Problems:   Hypertensive urgency   Elevated serum creatinine   Prolonged Q-T interval on ECG   Cardiomyopathy    Time spent: 35 mins    [facility] ClinicHOMPSON,Regina Coppolino MD [facility] Pager [phone]. If 7PM-7AM, please contact night-coverage at [email or web address], password [email or web address] 10/22/2014, 8:15 AM  LOS: 3 days

## 2014-10-22 NOTE — Progress Notes (Signed)
Bilateral renal artery duplex completed.  No evidence of significant renal artery stenosis (0-59% stenosis).

## 2014-10-22 NOTE — CV Procedure (Signed)
    Cardiac Catheterization Procedure Note  Name: Job FoundsScottie J Wooden MRN: [id number] DOB: 11/09/1981  Procedure: Left Heart Cath, Selective Coronary Angiography  Indication: 33 yo BM with uncontrolled HTN and new onset CHF with dilated cardiomyopathy.   Procedural Details: The right wrist was prepped, draped, and anesthetized with 1% lidocaine. Using the modified Seldinger technique, a 6 French slender sheath was introduced into the right radial artery. 3 mg of verapamil was administered through the sheath, weight-based unfractionated heparin was administered intravenously. Standard Judkins catheters were used for selective coronary angiography and left ventricular pressures. Catheter exchanges were performed over an exchange length guidewire. There were no immediate procedural complications. A TR band was used for radial hemostasis at the completion of the procedure.  The patient was transferred to the post catheterization recovery area for further monitoring.  Procedural Findings: Hemodynamics: AO 159/113 mean 135 mm Hg LV 164/26 mm Hg  Coronary angiography: Coronary dominance: right  Left mainstem: Very large. Normal.  Left anterior descending (LAD): very large, normal.  Left circumflex (LCx): Very large, normal  Right coronary artery (RCA): Normal.  Left ventriculography: Left ventricular systolic function is normal, LVEF is estimated at 55-65%, there is no significant mitral regurgitation   Final Conclusions:   1. Normal coronary anatomy. Vessels are very large due to hypertensive cardiomyopathy. 2. Elevated LVEDP  Recommendations: Medical management.  Sharai Overbay [place]Jordan, MDFACC  10/22/2014, 10:55 AM

## 2014-10-22 NOTE — Progress Notes (Signed)
Pt to cath lab for procedure. 

## 2014-10-22 NOTE — Plan of Care (Signed)
Problem: Phase II Progression Outcomes Goal: Dyspnea controlled with activity Outcome: Completed/Met Date Met:  10/22/14

## 2014-10-22 NOTE — Progress Notes (Signed)
     SUBJECTIVE: No chest pain or SOB this am.   BP 183/117 mmHg  Pulse 105  Temp(Src) 98.6 F (37 C) (Oral)  Resp 18  Ht 6' 1" (1.854 m)  Wt 265 lb 6.4 oz (120.385 kg)  BMI 35.02 kg/m2  SpO2 98%  Intake/Output Summary (Last 24 hours) at 10/22/14 0613 Last data filed at 10/21/14 2227  Gross per 24 hour  Intake   1440 ml  Output    300 ml  Net   1140 ml    PHYSICAL EXAM General: Well developed, well nourished, in no acute distress. Alert and oriented x 3.  Psych:  Good affect, responds appropriately  LABS: Basic Metabolic Panel:  Recent Labs  10/19/14 1750  10/21/14 0555 10/22/14 0508  NA  --   < > 135* 137  K  --   < > 3.8 4.4  CL  --   < > 99 100  CO2  --   < > 22 25  GLUCOSE  --   < > 128* 109*  BUN  --   < > 17 17  CREATININE 1.50*  < > 1.51* 1.63*  CALCIUM  --   < > 9.6 9.5  MG 2.0  --   --   --   < > = values in this interval not displayed. CBC:  Recent Labs  10/19/14 1038 10/19/14 1750  WBC 7.1 8.7  NEUTROABS 4.2  --   HGB 14.4 15.0  HCT 42.6 44.3  MCV 85.0 85.0  PLT 299 315   Cardiac Enzymes:  Recent Labs  10/19/14 1750 10/20/14 0002 10/20/14 0436  TROPONINI <0.30 <0.30 <0.30   Current Meds: . carvedilol  6.25 mg Oral BID WC  . furosemide  40 mg Oral Daily  . heparin  5,000 Units Subcutaneous 3 times per day  . isosorbide-hydrALAZINE  1 tablet Oral TID  . sodium chloride  3 mL Intravenous Q12H  . sodium chloride  3 mL Intravenous Q12H   Echo:  - Left ventricle: The global LV strain is abnormal at -10.9% The cavity size was mildly dilated. There was moderate concentric hypertrophy. Systolic function was moderately reduced. The estimated ejection fraction was in the range of 35% to 40%. Diffuse hypokinesis. There is akinesis of the basalinferoseptal myocardium. There is akinesis of the basal-midinferior myocardium. - Aortic valve: There was moderate regurgitation. - Aorta: Aortic root dimension: 37 mm (ED). -  Ascending aorta: The ascending aorta was mildly dilated. - Mitral valve: There was mild to moderate regurgitation directed eccentrically and toward the free wall. - Left atrium: The atrium was severely dilated. - Right ventricle: The cavity size was severely dilated. Wall thickness was normal. - Tricuspid valve: There was mild regurgitation. - Pulmonic valve: There was mild regurgitation.  ASSESSMENT AND PLAN:  1. Dilated cardiomyopathy: ? Secondary to HTN. Plans for cardiac cath today to exclude CAD.   2. Renal insuffiency: ? Chronicity. Creatinine overall stable this am. Will proceed with cardiac cath.   3. HTN: No evidence of renal artery stenosis. Continue Bidil and Coreg. Will need addition of ARB vs Ace-inh post cath.   Steve Carrillo  11/19/20156:13 AM  

## 2014-10-22 NOTE — Plan of Care (Signed)
Problem: Phase I Progression Outcomes Goal: Other Phase I Outcomes/Goals Outcome: Not Applicable Date Met:  10/22/14     

## 2014-10-22 NOTE — H&P (View-Only) (Signed)
     SUBJECTIVE: No chest pain or SOB this am.   BP 183/117 mmHg  Pulse 105  Temp(Src) 98.6 F (37 C) (Oral)  Resp 18  Ht 6\' 1"  (1.854 m)  Wt 265 lb 6.4 oz (120.385 kg)  BMI 35.02 kg/m2  SpO2 98%  Intake/Output Summary (Last 24 hours) at 10/22/14 16100613 Last data filed at 10/21/14 2227  Gross per 24 hour  Intake   1440 ml  Output    300 ml  Net   1140 ml    PHYSICAL EXAM General: Well developed, well nourished, in no acute distress. Alert and oriented x 3.  Psych:  Good affect, responds appropriately  LABS: Basic Metabolic Panel:  Recent Labs  96/03/5410/16/15 1750  10/21/14 0555 10/22/14 0508  NA  --   < > 135* 137  K  --   < > 3.8 4.4  CL  --   < > 99 100  CO2  --   < > 22 25  GLUCOSE  --   < > 128* 109*  BUN  --   < > 17 17  CREATININE 1.50*  < > 1.51* 1.63*  CALCIUM  --   < > 9.6 9.5  MG 2.0  --   --   --   < > = values in this interval not displayed. CBC:  Recent Labs  10/19/14 1038 10/19/14 1750  WBC 7.1 8.7  NEUTROABS 4.2  --   HGB 14.4 15.0  HCT 42.6 44.3  MCV 85.0 85.0  PLT 299 315   Cardiac Enzymes:  Recent Labs  10/19/14 1750 10/20/14 0002 10/20/14 0436  TROPONINI <0.30 <0.30 <0.30   Current Meds: . carvedilol  6.25 mg Oral BID WC  . furosemide  40 mg Oral Daily  . heparin  5,000 Units Subcutaneous 3 times per day  . isosorbide-hydrALAZINE  1 tablet Oral TID  . sodium chloride  3 mL Intravenous Q12H  . sodium chloride  3 mL Intravenous Q12H   Echo:  - Left ventricle: The global LV strain is abnormal at -10.9% The cavity size was mildly dilated. There was moderate concentric hypertrophy. Systolic function was moderately reduced. The estimated ejection fraction was in the range of 35% to 40%. Diffuse hypokinesis. There is akinesis of the basalinferoseptal myocardium. There is akinesis of the basal-midinferior myocardium. - Aortic valve: There was moderate regurgitation. - Aorta: Aortic root dimension: 37 mm (ED). -  Ascending aorta: The ascending aorta was mildly dilated. - Mitral valve: There was mild to moderate regurgitation directed eccentrically and toward the free wall. - Left atrium: The atrium was severely dilated. - Right ventricle: The cavity size was severely dilated. Wall thickness was normal. - Tricuspid valve: There was mild regurgitation. - Pulmonic valve: There was mild regurgitation.  ASSESSMENT AND PLAN:  1. Dilated cardiomyopathy: ? Secondary to HTN. Plans for cardiac cath today to exclude CAD.   2. Renal insuffiency: ? Chronicity. Creatinine overall stable this am. Will proceed with cardiac cath.   3. HTN: No evidence of renal artery stenosis. Continue Bidil and Coreg. Will need addition of ARB vs Ace-inh post cath.   Clent Damore  11/19/20156:13 AM

## 2014-10-22 NOTE — Plan of Care (Signed)
Problem: Phase II Progression Outcomes Goal: Other Phase II Outcomes/Goals Outcome: Not Applicable Date Met:  60/16/58

## 2014-10-22 NOTE — Progress Notes (Signed)
UR completed 

## 2014-10-23 DIAGNOSIS — R748 Abnormal levels of other serum enzymes: Secondary | ICD-10-CM

## 2014-10-23 LAB — BASIC METABOLIC PANEL
ANION GAP: 15 (ref 5–15)
BUN: 21 mg/dL (ref 6–23)
CALCIUM: 9.2 mg/dL (ref 8.4–10.5)
CO2: 21 mEq/L (ref 19–32)
CREATININE: 1.55 mg/dL — AB (ref 0.50–1.35)
Chloride: 102 mEq/L (ref 96–112)
GFR, EST AFRICAN AMERICAN: 67 mL/min — AB (ref >90)
GFR, EST NON AFRICAN AMERICAN: 58 mL/min — AB (ref >90)
Glucose, Bld: 109 mg/dL — ABNORMAL HIGH (ref 70–99)
Potassium: 3.9 mEq/L (ref 3.7–5.3)
Sodium: 138 mEq/L (ref 137–147)

## 2014-10-23 LAB — CBC
HCT: 40.9 % (ref 39.0–52.0)
Hemoglobin: 13.5 g/dL (ref 13.0–17.0)
MCH: 28.3 pg (ref 26.0–34.0)
MCHC: 33 g/dL (ref 30.0–36.0)
MCV: 85.7 fL (ref 78.0–100.0)
PLATELETS: 339 10*3/uL (ref 150–400)
RBC: 4.77 MIL/uL (ref 4.22–5.81)
RDW: 13.6 % (ref 11.5–15.5)
WBC: 7.8 10*3/uL (ref 4.0–10.5)

## 2014-10-23 MED ORDER — LOSARTAN POTASSIUM 25 MG PO TABS
25.0000 mg | ORAL_TABLET | Freq: Every day | ORAL | Status: DC
  Administered 2014-10-23: 09:00:00 25 mg via ORAL
  Filled 2014-10-23: qty 1

## 2014-10-23 MED ORDER — CARVEDILOL 12.5 MG PO TABS
12.5000 mg | ORAL_TABLET | Freq: Two times a day (BID) | ORAL | Status: DC
  Administered 2014-10-23 – 2014-10-25 (×4): 12.5 mg via ORAL
  Filled 2014-10-23 (×5): qty 1

## 2014-10-23 MED ORDER — AMLODIPINE BESYLATE 5 MG PO TABS
5.0000 mg | ORAL_TABLET | Freq: Every day | ORAL | Status: DC
  Administered 2014-10-23 – 2014-10-25 (×3): 5 mg via ORAL
  Filled 2014-10-23 (×3): qty 1

## 2014-10-23 MED FILL — Fentanyl Citrate Inj 0.05 MG/ML: INTRAMUSCULAR | Qty: 2 | Status: AC

## 2014-10-23 MED FILL — Midazolam HCl Inj 2 MG/2ML (Base Equivalent): INTRAMUSCULAR | Qty: 2 | Status: AC

## 2014-10-23 MED FILL — Verapamil HCl IV Soln 2.5 MG/ML: INTRAVENOUS | Qty: 2 | Status: AC

## 2014-10-23 MED FILL — Heparin Sodium (Porcine) Inj 1000 Unit/ML: INTRAMUSCULAR | Qty: 10 | Status: AC

## 2014-10-23 MED FILL — Heparin Sodium (Porcine) 2 Unit/ML in Sodium Chloride 0.9%: INTRAMUSCULAR | Qty: 2000 | Status: AC

## 2014-10-23 MED FILL — Lidocaine HCl Local Preservative Free (PF) Inj 1%: INTRAMUSCULAR | Qty: 30 | Status: AC

## 2014-10-23 NOTE — Progress Notes (Signed)
Patient Name: Steve Carrillo Date of Encounter: 10/23/2014  Cardiologist: New   Principal Problem:   Acute systolic CHF (congestive heart failure) Active Problems:   Hypertensive urgency   Elevated serum creatinine   Prolonged Q-T interval on ECG   Cardiomyopathy    SUBJECTIVE  No SOB or CP  CURRENT MEDS . carvedilol  6.25 mg Oral BID WC  . furosemide  40 mg Oral Daily  . heparin  5,000 Units Subcutaneous 3 times per day  . isosorbide-hydrALAZINE  1 tablet Oral TID  . sodium chloride  3 mL Intravenous Q12H    OBJECTIVE  Filed Vitals:   10/23/14 0100 10/23/14 0200 10/23/14 0300 10/23/14 0345  BP: 147/77 114/96 142/95 142/95  Pulse: 95 94 99   Temp:    98 F (36.7 C)  TempSrc:    Oral  Resp:    18  Height:      Weight:    267 lb 13.7 oz (121.5 kg)  SpO2: 95% 93% 94%     Intake/Output Summary (Last 24 hours) at 10/23/14 0615 Last data filed at 10/22/14 1844  Gross per 24 hour  Intake 676.25 ml  Output   1450 ml  Net -773.75 ml   Filed Weights   10/21/14 0500 10/22/14 0550 10/23/14 0345  Weight: 265 lb 3.2 oz (120.294 kg) 265 lb 6.4 oz (120.385 kg) 267 lb 13.7 oz (121.5 kg)    PHYSICAL EXAM  General: Pleasant, NAD. Neuro: Alert and oriented X 3. Moves all extremities spontaneously. Psych: Normal affect. HEENT:  Normal  Neck: Supple without bruits or JVD. Lungs:  Resp regular and unlabored, CTA. Heart: RRR no s3, s4, or murmurs. R radial cath site stable, good distal pulse, no bleeding Abdomen: Soft, non-tender, non-distended, BS + x 4.  Extremities: No clubbing, cyanosis or edema. DP/PT/Radials 2+ and equal bilaterally.  Accessory Clinical Findings  CBC  Recent Labs  10/22/14 1312 10/23/14 0340  WBC 7.4 7.8  HGB 14.6 13.5  HCT 43.7 40.9  MCV 86.2 85.7  PLT 346 339   Basic Metabolic Panel  Recent Labs  10/22/14 0508 10/22/14 1312 10/23/14 0340  NA 137  --  138  K 4.4  --  3.9  CL 100  --  102  CO2 25  --  21  GLUCOSE 109*  --   109*  BUN 17  --  21  CREATININE 1.63* 1.30 1.55*  CALCIUM 9.5  --  9.2    TELE NSR with HR 80-90s   BNP (last 3 results)  Recent Labs  10/19/14 1038  PROBNP 1941.0*   ECG  No new EKG  Echocardiogram 10/20/2014  - Left ventricle: The global LV strain is abnormal at -10.9% The cavity size was mildly dilated. There was moderate concentric hypertrophy. Systolic function was moderately reduced. The estimated ejection fraction was in the range of 35% to 40%. Diffuse hypokinesis. There is akinesis of the basalinferoseptal myocardium. There is akinesis of the basal-midinferior myocardium. - Aortic valve: There was moderate regurgitation. - Aorta: Aortic root dimension: 37 mm (ED). - Ascending aorta: The ascending aorta was mildly dilated. - Mitral valve: There was mild to moderate regurgitation directed eccentrically and toward the free wall. - Left atrium: The atrium was severely dilated. - Right ventricle: The cavity size was severely dilated. Wall thickness was normal. - Tricuspid valve: There was mild regurgitation. - Pulmonic valve: There was mild regurgitation.    Radiology/Studies  Dg Chest 2 View  10/19/2014  CLINICAL DATA:  One week history of cough, chest congestion, shortness of breath and mid chest pain. Former smoker who quit approximately 5 weeks ago. Current history of hypertension.  EXAM: CHEST  2 VIEW  COMPARISON:  08/29/2008.  FINDINGS: Moderate cardiac enlargement, with significant increase in heart size since 2009. Moderate diffuse interstitial pulmonary edema. No confluent airspace consolidation. No pleural effusions. Visualized bony thorax intact.  IMPRESSION: CHF and/or fluid overload, with moderate cardiomegaly and moderate diffuse interstitial pulmonary edema. (Heart size has significantly increased since 2009).   Electronically Signed   By: Hulan Saashomas  Lawrence M.D.   On: 10/19/2014 11:36   [place] Renal  10/21/2014   CLINICAL DATA:  Acute  renal failure.  EXAM: RENAL/URINARY TRACT ULTRASOUND COMPLETE  COMPARISON:  None.  FINDINGS: Right Kidney:  Length: 12.9 cm. Echogenicity within normal limits. No mass or hydronephrosis visualized.  Left Kidney:  Length: 12.7 cm. Echogenicity within normal limits. No mass or hydronephrosis visualized.  Bladder:  Not well visualized as it is decompressed.  IMPRESSION: Normal renal ultrasound.   Electronically Signed   By: Roque LiasJames  Green M.D.   On: 10/21/2014 13:16    ASSESSMENT AND PLAN  1. Acute on chronic systolic and diastolic HF  - likely 2/2 HTN and medication noncompliance  - Echo 11/17 EF 35-40%, with regional wall motion abnormality (akinesis of inferoseptal and basal midinferior myocardium, moderate AR, mild to moderate MR, severely dilated LA and RV  - cath 10/22/2014 EF 55-65% normal coronaries, elevated LVEDP. Continue medical management  - Continue coreg and Imdur, Cr mildly increased, however stable since admission, will add losartan. Uptitrate coreg when HF symptom resolve  2. Malignant HTN: renal arterial U/S did not reveal significant stenosis  - still uncontrolled. Will need better control before discharge  3. Renal insufficiency, acute vs chronic 4. Obesity: need outpatient sleep study  Hassan BucklerSigned, Meng, Hao PA-C Pager: [phone]   Patient seen and examined. Agree with assessment and plan. Currently BP 172/119; HR 100; no JVD, lungs clear. Will increase carvedilol to 12.5 mg bid; will also add amlodipine 5 mg and if renal fxn is stable tomorrow then add ARB. Would not dc today. F/U BNP , BMET tomorrow.   Lennette Biharihomas A. Lou Loewe, MD, [facility] 10/23/2014 8:54 AM

## 2014-10-23 NOTE — Progress Notes (Signed)
UR completed 

## 2014-10-23 NOTE — Progress Notes (Signed)
[facility] PROGRESS NOTE  Steve Carrillo ZOX:[id number] DOB: Jul 21, 1981 DOA: 10/19/2014 PCP: Sanda Lingerhomas Jones, MD  Assessment/Plan: #1 acute CHF exacerbation Questionable etiology. Likely secondary to poorly controlled/malignant hypertension. Cardiac enzymes negative 3.2-D echo with concerns of a cardiomyopathy with EF of 35-40% with diffuse hypokinesis. Clinical improvement. I/O likely not recorded accurately. Patient's current weight is 120.38 kg. Patient has been seen in consultation by cardiology and due to abnormal 2-D echo patient to undergo cardiac catheterization today.Lasix dose has been increased to 40 mg daily. Continue current dose of Coreg and BiDil. Per cardiology posrt cath may need to add ace inhibitor.Cardiology following and appreciate input and recommendations.  #2 malignant hypertension/hypertensive urgency Secondary to medical noncompliance. Patient stated he ran out of his medications over the past 2 years. Some improvement with blood pressure. Continue current dose of Coreg. Lasix dose, BiDil.  Per cardiology post cath may need to add ACE inhibitor. Follow and titrate as needed.  #3 elevated creatinine versus probable chronic kidney disease Likely secondary to poorly controlled hypertension. Renal US nl. FeNa = 0.14%. Renal function stable. Monitor closely post cath. Monitor renal function with diuretics. Follow. -baseline 1.1 in 2010 so may have CKD stage 2  #4 Dilated cardiomyopathy Questionable etiology. May be secondary to malignant hypertension. 2-D echo-with a depressed ejection fraction of 35-40% with diffuse hypokinesis. Cardiac enzymes negative 3. Continue current regimen of Coreg, Lasix, BiDil. S/p cath: EF 55-65% normal coronaries, elevated LVEDP. Continue medical management  #5 prophylaxis Heparin for DVT prophylaxis.   Code Status: full Family Communication: updated patient Disposition Plan: home in AM if BP better?   Consultants:  Cardiology:  Dr. Royann Shiversroitoru 10/21/2014  Procedures:  2-D echo 10/20/2014  Chest x-ray 10/19/2014  Renal US 10/21/14  Antibiotics:  none  HPI/Subjective: Patient denies any chest pain. No shortness of breath. No complaints.  Objective: Filed Vitals:   10/23/14 1000  BP: 147/92  Pulse: 95  Temp:   Resp:     Intake/Output Summary (Last 24 hours) at 10/23/14 1053 Last data filed at 10/23/14 0821  Gross per 24 hour  Intake 1096.25 ml  Output    500 ml  Net 596.25 ml   Filed Weights   10/21/14 0500 10/22/14 0550 10/23/14 0345  Weight: 120.294 kg (265 lb 3.2 oz) 120.385 kg (265 lb 6.4 oz) 121.5 kg (267 lb 13.7 oz)    Exam:   General:  NAD  Cardiovascular: RRR  Respiratory: CTAB  Abdomen: soft, nontender, nondistended, positive bowel sounds.  Musculoskeletal: no clubbing cyanosis or edema.  Data Reviewed: Basic Metabolic Panel:  Recent Labs Lab 10/19/14 1038 10/19/14 1750 10/20/14 0436 10/21/14 0555 10/22/14 0508 10/22/14 1312 10/23/14 0340  NA 138  --  137 135* 137  --  138  K 4.1  --  3.6* 3.8 4.4  --  3.9  CL 102  --  100 99 100  --  102  CO2 21  --  23 22 25   --  21  GLUCOSE 109*  --  112* 128* 109*  --  109*  BUN 16  --  18 17 17   --  21  CREATININE 1.41* 1.50* 1.34 1.51* 1.63* 1.30 1.55*  CALCIUM 9.1  --  9.6 9.6 9.5  --  9.2  MG  --  2.0  --   --   --   --   --    Liver Function Tests:  Recent Labs Lab 10/19/14 1038  AST 20  ALT 21  ALKPHOS 81  BILITOT 0.4  PROT 7.3  ALBUMIN 3.4*   No results for input(s): LIPASE, AMYLASE in the last 168 hours. No results for input(s): AMMONIA in the last 168 hours. CBC:  Recent Labs Lab 10/19/14 1038 10/19/14 1750 10/22/14 0508 10/22/14 1312 10/23/14 0340  WBC 7.1 8.7 8.9 7.4 7.8  NEUTROABS 4.2  --   --   --   --   HGB 14.4 15.0 14.2 14.6 13.5  HCT 42.6 44.3 43.2 43.7 40.9  MCV 85.0 85.0 86.6 86.2 85.7  PLT 299 315 354 346 339   Cardiac Enzymes:  Recent Labs Lab 10/19/14 1038 10/19/14 1750  10/20/14 0002 10/20/14 0436  TROPONINI <0.30 <0.30 <0.30 <0.30   BNP (last 3 results)  Recent Labs  10/19/14 1038  PROBNP 1941.0*   CBG: No results for input(s): GLUCAP in the last 168 hours.  No results found for this or any previous visit (from the past 240 hour(s)).   Studies: [place] Renal  10/21/2014   CLINICAL DATA:  Acute renal failure.  EXAM: RENAL/URINARY TRACT ULTRASOUND COMPLETE  COMPARISON:  None.  FINDINGS: Right Kidney:  Length: 12.9 cm. Echogenicity within normal limits. No mass or hydronephrosis visualized.  Left Kidney:  Length: 12.7 cm. Echogenicity within normal limits. No mass or hydronephrosis visualized.  Bladder:  Not well visualized as it is decompressed.  IMPRESSION: Normal renal ultrasound.   Electronically Signed   By: Roque LiasJames  Green M.D.   On: 10/21/2014 13:16    Scheduled Meds: . amLODipine  5 mg Oral Daily  . carvedilol  12.5 mg Oral BID WC  . furosemide  40 mg Oral Daily  . heparin  5,000 Units Subcutaneous 3 times per day  . isosorbide-hydrALAZINE  1 tablet Oral TID  . sodium chloride  3 mL Intravenous Q12H   Continuous Infusions:    Principal Problem:   Acute systolic CHF (congestive heart failure) Active Problems:   Hypertensive urgency   Elevated serum creatinine   Prolonged Q-T interval on ECG   Cardiomyopathy    Time spent: 35 mins    Lupe Handley DO [facility] Pager [phone]. If 7PM-7AM, please contact night-coverage at [email or web address], password [email or web address] 10/23/2014, 10:53 AM  LOS: 4 days

## 2014-10-24 ENCOUNTER — Encounter (HOSPITAL_COMMUNITY): Payer: Self-pay | Admitting: Adult Health

## 2014-10-24 DIAGNOSIS — I509 Heart failure, unspecified: Secondary | ICD-10-CM

## 2014-10-24 DIAGNOSIS — I5043 Acute on chronic combined systolic (congestive) and diastolic (congestive) heart failure: Secondary | ICD-10-CM

## 2014-10-24 LAB — BASIC METABOLIC PANEL
Anion gap: 15 (ref 5–15)
BUN: 26 mg/dL — AB (ref 6–23)
CO2: 21 mEq/L (ref 19–32)
Calcium: 9 mg/dL (ref 8.4–10.5)
Chloride: 103 mEq/L (ref 96–112)
Creatinine, Ser: 1.55 mg/dL — ABNORMAL HIGH (ref 0.50–1.35)
GFR calc non Af Amer: 58 mL/min — ABNORMAL LOW (ref >90)
GFR, EST AFRICAN AMERICAN: 67 mL/min — AB (ref >90)
Glucose, Bld: 97 mg/dL (ref 70–99)
POTASSIUM: 4.2 meq/L (ref 3.7–5.3)
SODIUM: 139 meq/L (ref 137–147)

## 2014-10-24 MED ORDER — LOSARTAN POTASSIUM 50 MG PO TABS
50.0000 mg | ORAL_TABLET | Freq: Every day | ORAL | Status: DC
  Administered 2014-10-24 – 2014-10-25 (×2): 50 mg via ORAL
  Filled 2014-10-24 (×2): qty 1

## 2014-10-24 NOTE — Progress Notes (Signed)
Consulting cardiologist:Croituru, Mihai Primary Cardiologist: Croituru, Mihai  Cardiology Specific Problem List:  1. Uncontrolled hypertension 2. S/P cardiac cath with normal coronaries 1//19/2015 3. Hypertensive CM- EF of 35-40% 4. Acute on Chronic Systolic CHF   Subjective:    He is without complaint of chest pain, DOE, or weakness.   Objective:   Temp:  [98 F (36.7 C)-99.5 F (37.5 C)] 98 F (36.7 C) (11/21 0821) Pulse Rate:  [92-100] 96 (11/21 0821) Resp:  [18] 18 (11/21 0821) BP: (119-175)/(73-119) 146/100 mmHg (11/21 0821) SpO2:  [94 %-100 %] 98 % (11/21 0821) Weight:  [268 lb 4.8 oz (121.7 kg)] 268 lb 4.8 oz (121.7 kg) (11/21 0300) Last BM Date: 10/23/14  Filed Weights   10/22/14 0550 10/23/14 0345 10/24/14 0300  Weight: 265 lb 6.4 oz (120.385 kg) 267 lb 13.7 oz (121.5 kg) 268 lb 4.8 oz (121.7 kg)    Intake/Output Summary (Last 24 hours) at 10/24/14 0858 Last data filed at 10/24/14 0823  Gross per 24 hour  Intake    860 ml  Output      0 ml  Net    860 ml    Telemetry: NSR rates  Exam:  General: No acute distress.  HEENT: Conjunctiva and lids normal, oropharynx clear.  Lungs: Clear to auscultation, nonlabored.  Cardiac: No elevated JVP or bruits. RRR, no gallop or rub.   Abdomen: Normoactive bowel sounds, nontender, nondistended.  Extremities: No pitting edema, distal pulses full.  Neuropsychiatric: Alert and oriented x3, affect appropriate.   Lab Results:  Basic Metabolic Panel:  Recent Labs Lab 10/19/14 1750  10/22/14 0508 10/22/14 1312 10/23/14 0340 10/24/14 0442  NA  --   < > 137  --  138 139  K  --   < > 4.4  --  3.9 4.2  CL  --   < > 100  --  102 103  CO2  --   < > 25  --  21 21  GLUCOSE  --   < > 109*  --  109* 97  BUN  --   < > 17  --  21 26*  CREATININE 1.50*  < > 1.63* 1.30 1.55* 1.55*  CALCIUM  --   < > 9.5  --  9.2 9.0  MG 2.0  --   --   --   --   --   < > = values in this interval not displayed.  Liver Function  Tests:  Recent Labs Lab 10/19/14 1038  AST 20  ALT 21  ALKPHOS 81  BILITOT 0.4  PROT 7.3  ALBUMIN 3.4*    CBC:  Recent Labs Lab 10/22/14 0508 10/22/14 1312 10/23/14 0340  WBC 8.9 7.4 7.8  HGB 14.2 14.6 13.5  HCT 43.2 43.7 40.9  MCV 86.6 86.2 85.7  PLT 354 346 339    Cardiac Enzymes:  Recent Labs Lab 10/19/14 1750 10/20/14 0002 10/20/14 0436  TROPONINI <0.30 <0.30 <0.30    BNP:  Recent Labs  10/19/14 1038  PROBNP 1941.0*    Coagulation:  Recent Labs Lab 10/22/14 0508  INR 1.09    Radiology: CXR 10/19/2014 IMPRESSION: CHF and/or fluid overload, with moderate cardiomegaly and moderate diffuse interstitial pulmonary edema. (Heart size has significantly increased since 2009).  Echocardiogram 10/20/2014 Left ventricle: The global LV strain is abnormal at -10.9% The cavity size was mildly dilated. There was moderate concentric hypertrophy. Systolic function was moderately reduced. The estimated ejection fraction was in the range of 35%  to 40%. Diffuse hypokinesis. There is akinesis of the basalinferoseptal myocardium. There is akinesis of the basal-midinferior myocardium. - Aortic valve: There was moderate regurgitation. - Aorta: Aortic root dimension: 37 mm (ED). - Ascending aorta: The ascending aorta was mildly dilated. - Mitral valve: There was mild to moderate regurgitation directed eccentrically and toward the free wall. - Left atrium: The atrium was severely dilated. - Right ventricle: The cavity size was severely dilated. Wall thickness was normal. - Tricuspid valve: There was mild regurgitation. - Pulmonic valve: There was mild regurgitation.   Medications:   Scheduled Medications: . amLODipine  5 mg Oral Daily  . carvedilol  12.5 mg Oral BID WC  . furosemide  40 mg Oral Daily  . heparin  5,000 Units Subcutaneous 3 times per day  . isosorbide-hydrALAZINE  1 tablet Oral TID  . sodium chloride  3 mL Intravenous Q12H        PRN Medications: sodium chloride, acetaminophen, hydrALAZINE, labetalol, ondansetron (ZOFRAN) IV, sodium chloride, traMADol   Assessment and Plan:   1. Hypertensive Cardiomyopathy: Continues mildly elevated BP with diastolic hypertension >16000mmHg. He is on amlodipine 5 mg, carvedilol 12.5 mg BID, Bidil 20/37.5  TID. IV hydralazine prn, and labetalol prn. Will add cozaar 50 mg to medication regimen for better BP control. Follow up BMET in am. Will need to be transferred to telemetry for ongoing BP evaluation before discharging home. I have spoken at length with patient and family concerning lifestyle management and medications. Once BP controlled follow up echo in 3 months. He will need close follow up with cardiology post discharge. Needs to be established.  2. Acute on Chronic Systolic CHF: He is well compensated currently. Now on PO lasix 40 mg daily. BMET in am  Bettey MareKathryn M. Lawrence NP [facility]  10/24/2014, 8:58 AM  As above, patient seen and examined. The patient has a hypertensive cardio myopathy with congestive heart failure. He denies dyspnea or chest pain. Her pressure remains elevated. Will add Cozaar 50 mg daily and follow renal function closely. Increase medications as needed. I have explained the importance of blood pressure control. Would transfer to telemetry and if blood pressure improves patient could be discharged tomorrow morning and follow up with Dr. Royann Shiversroitoru. Olga MillersBrian Crenshaw

## 2014-10-24 NOTE — Progress Notes (Signed)
[facility] PROGRESS NOTE  Steve FoundsScottie [name] UJW:[id number] DOB: [birth year] DOA: 10/19/2014 PCP: Sanda Lingerhomas Jones, MD  Assessment/Plan: #1 acute CHF exacerbation Questionable etiology. Likely secondary to poorly controlled/malignant hypertension.  -Cardiac enzymes negative 3 -2-D echo with concerns of a cardiomyopathy with EF of 35-40% with diffuse hypokinesis. Clinical improvement.  -cardiology s/p cardiac catheterization  -Lasix dose has been increased to 40 mg daily.  -has not yet received all of his BP meds this AM (had a lower BP yesterday afternoon in the 130s) -cards added ace inhibitor  #2 malignant hypertension/hypertensive urgency Secondary to medical noncompliance. Patient stated he ran out of his medications over the past 2 years.   #3 elevated creatinine versus probable chronic kidney disease- prob CKD stage 2 Likely secondary to poorly controlled hypertension. Renal US nl. FeNa = 0.14%. Renal function stable. Monitor closely post cath. Monitor renal function with diuretics. Follow. -baseline 1.1 in 2010  -will need close follow up -BMP in AM  #4 Dilated cardiomyopathy Questionable etiology. May be secondary to malignant hypertension. 2-D echo-with a depressed ejection fraction of 35-40% with diffuse hypokinesis. Cardiac enzymes negative 3. Continue current regimen of Coreg, Lasix, BiDil. S/p cath: EF 55-65% normal coronaries, elevated LVEDP. Continue medical management  #5 prophylaxis Heparin for DVT prophylaxis.   Code Status: full Family Communication: updated patient Disposition Plan: tx to tele, needs better BP control   Consultants:  Cardiology: Dr. Royann Shiversroitoru 10/21/2014  Procedures:  2-D echo 10/20/2014  Chest x-ray 10/19/2014  Renal US 10/21/14  Antibiotics:  none  HPI/Subjective: No symptoms  Objective: Filed Vitals:   10/24/14 0821  BP: 146/100  Pulse: 96  Temp: 98 F (36.7 C)  Resp: 18    Intake/Output Summary (Last 24 hours)  at 10/24/14 0912 Last data filed at 10/24/14 [id number]  Gross per 24 hour  Intake    860 ml  Output      0 ml  Net    860 ml   Filed Weights   10/22/14 0550 10/23/14 0345 10/24/14 0300  Weight: 120.385 kg (265 lb 6.4 oz) 121.5 kg (267 lb 13.7 oz) 121.7 kg (268 lb 4.8 oz)    Exam:   General:  NAD  Cardiovascular: RRR  Respiratory: CTAB  Abdomen: soft, nontender, nondistended, positive bowel sounds.  Musculoskeletal: no clubbing cyanosis or edema.  Data Reviewed: Basic Metabolic Panel:  Recent Labs Lab 10/19/14 1750 10/20/14 0436 10/21/14 0555 10/22/14 0508 10/22/14 1312 10/23/14 0340 10/24/14 0442  NA  --  137 135* 137  --  138 139  K  --  3.6* 3.8 4.4  --  3.9 4.2  CL  --  100 99 100  --  102 103  CO2  --  23 22 25   --  21 21  GLUCOSE  --  112* 128* 109*  --  109* 97  BUN  --  18 17 17   --  21 26*  CREATININE 1.50* 1.34 1.51* 1.63* 1.30 1.55* 1.55*  CALCIUM  --  9.6 9.6 9.5  --  9.2 9.0  MG 2.0  --   --   --   --   --   --    Liver Function Tests:  Recent Labs Lab 10/19/14 1038  AST 20  ALT 21  ALKPHOS 81  BILITOT 0.4  PROT 7.3  ALBUMIN 3.4*   No results for input(s): LIPASE, AMYLASE in the last 168 hours. No results for input(s): AMMONIA in the last 168 hours. CBC:  Recent Labs Lab 10/19/14  1038 10/19/14 1750 10/22/14 0508 10/22/14 1312 10/23/14 0340  WBC 7.1 8.7 8.9 7.4 7.8  NEUTROABS 4.2  --   --   --   --   HGB 14.4 15.0 14.2 14.6 13.5  HCT 42.6 44.3 43.2 43.7 40.9  MCV 85.0 85.0 86.6 86.2 85.7  PLT 299 315 354 346 339   Cardiac Enzymes:  Recent Labs Lab 10/19/14 1038 10/19/14 1750 10/20/14 0002 10/20/14 0436  TROPONINI <0.30 <0.30 <0.30 <0.30   BNP (last 3 results)  Recent Labs  10/19/14 1038  PROBNP 1941.0*   CBG: No results for input(s): GLUCAP in the last 168 hours.  No results found for this or any previous visit (from the past 240 hour(s)).   Studies: No results found.  Scheduled Meds: . amLODipine  5 mg Oral  Daily  . carvedilol  12.5 mg Oral BID WC  . furosemide  40 mg Oral Daily  . heparin  5,000 Units Subcutaneous 3 times per day  . isosorbide-hydrALAZINE  1 tablet Oral TID  . losartan  50 mg Oral Daily  . sodium chloride  3 mL Intravenous Q12H   Continuous Infusions:    Principal Problem:   Acute systolic CHF (congestive heart failure) Active Problems:   Hypertensive urgency   Elevated serum creatinine   Prolonged Q-T interval on ECG   Cardiomyopathy    Time spent: 35 mins    Tarvares Lant DO [facility] Pager [phone]. If 7PM-7AM, please contact night-coverage at [email or web address], password [email or web address] 10/24/2014, 9:12 AM  LOS: 5 days

## 2014-10-24 NOTE — Progress Notes (Signed)
Utilization Review completed.  

## 2014-10-24 NOTE — Progress Notes (Signed)
Called report to Dorothy on 2w, patient transferred with all belongings.

## 2014-10-25 LAB — BASIC METABOLIC PANEL
Anion gap: 15 (ref 5–15)
BUN: 23 mg/dL (ref 6–23)
CALCIUM: 9.1 mg/dL (ref 8.4–10.5)
CHLORIDE: 100 meq/L (ref 96–112)
CO2: 24 mEq/L (ref 19–32)
CREATININE: 1.5 mg/dL — AB (ref 0.50–1.35)
GFR calc Af Amer: 70 mL/min — ABNORMAL LOW (ref >90)
GFR, EST NON AFRICAN AMERICAN: 60 mL/min — AB (ref >90)
Glucose, Bld: 91 mg/dL (ref 70–99)
Potassium: 3.9 mEq/L (ref 3.7–5.3)
Sodium: 139 mEq/L (ref 137–147)

## 2014-10-25 MED ORDER — FUROSEMIDE 40 MG PO TABS: 40.0000 mg | ORAL_TABLET | Freq: Every day | ORAL | Status: DC

## 2014-10-25 MED ORDER — ISOSORB DINITRATE-HYDRALAZINE 20-37.5 MG PO TABS: 1.0000 | ORAL_TABLET | Freq: Three times a day (TID) | ORAL | Status: DC

## 2014-10-25 MED ORDER — CARVEDILOL 12.5 MG PO TABS: 12.5000 mg | ORAL_TABLET | Freq: Two times a day (BID) | ORAL | Status: DC

## 2014-10-25 MED ORDER — LOSARTAN POTASSIUM 50 MG PO TABS: 50.0000 mg | ORAL_TABLET | Freq: Every day | ORAL | Status: DC

## 2014-10-25 MED ORDER — AMLODIPINE BESYLATE 5 MG PO TABS: 5.0000 mg | ORAL_TABLET | Freq: Every day | ORAL | Status: DC

## 2014-10-25 NOTE — Discharge Summary (Signed)
Physician Discharge Summary  Steve Carrillo:[id number] DOB: [birth year] DOA: 10/19/2014  PCP: Sanda Linger, MD  Admit date: 10/19/2014 Discharge date: 10/25/2014  Time spent: 35 minutes  Recommendations for Outpatient Follow-up:  1. BMP 1 week re Cr and K 2. Echo 3 months  Discharge Diagnoses:  Principal Problem:   Acute systolic CHF (congestive heart failure) Active Problems:   Hypertensive urgency   Elevated serum creatinine   Prolonged Q-T interval on ECG   Cardiomyopathy   CHF (congestive heart failure)   Discharge Condition: improved  Diet recommendation: cardiac  Filed Weights   10/23/14 0345 10/24/14 0300 10/25/14 0414  Weight: 121.5 kg (267 lb 13.7 oz) 121.7 kg (268 lb 4.8 oz) 121.201 kg (267 lb 3.2 oz)    History of present illness:  Steve Carrillo is a 33 y.o. male  With prior history of hypertension. Taking his medications 2 years ago. Presents to the emergency department complaining of 1 day of shortness of breath. Worse with activity. He denies any sick contacts. Denies any productive cough. At this moment patient is unsure of anything that makes it better or worse. Problem since onset has been persistent and gradually getting worse.  Patient presented to the ED for further evaluation and recommendations.while in the ED was found to have elevated serum creatinine of 1.4, normal troponin level, but elevated BNP of 1941. Initial evaluation would also show an elevated blood pressure with last one while in the ED at 176/121. We were consulted for further medical evaluation recommendations.  Hospital Course:  #1 acute CHF exacerbation Questionable etiology. Likely secondary to poorly controlled/malignant hypertension.  -Cardiac enzymes negative 3 -2-D echo with concerns of a cardiomyopathy with EF of 35-40% with diffuse hypokinesis. Clinical improvement.  -cardiology s/p cardiac catheterization  -Lasix dose has been increased to 40 mg daily.  -cards  added ace inhibitor - echo in 3 months  #2 malignant hypertension/hypertensive urgency Secondary to medical noncompliance. Patient stated he ran out of his medications over the past 2 years.   #3 elevated creatinine versus probable chronic kidney disease- prob CKD stage 2 Likely secondary to poorly controlled hypertension. Renal US nl. FeNa = 0.14%. Renal function stable. Monitor closely post cath. Monitor renal function with diuretics. Follow. -baseline 1.1 in 2010  -will need close follow up -BMP in 1 week  #4 Dilated cardiomyopathy Questionable etiology. May be secondary to malignant hypertension. 2-D echo-with a depressed ejection fraction of 35-40% with diffuse hypokinesis. Cardiac enzymes negative 3. Continue current regimen of Coreg, Lasix, BiDil. S/p cath: EF 55-65% normal coronaries, elevated LVEDP. Continue medical management   Procedures:  cath  Consultations:  cardiology  Discharge Exam: Filed Vitals:   10/25/14 0414  BP: 143/95  Pulse: 95  Temp: 97.8 F (36.6 C)  Resp: 18    General: A+Ox3, NAD Cardiovascular: rrr Respiratory: clear  Discharge Instructions You were cared for by a hospitalist during your hospital stay. If you have any questions about your discharge medications or the care you received while you were in the hospital after you are discharged, you can call the unit and asked to speak with the hospitalist on call if the hospitalist that took care of you is not available. Once you are discharged, your primary care physician will handle any further medical issues. Please note that NO REFILLS for any discharge medications will be authorized once you are discharged, as it is imperative that you return to your primary care physician (or establish a relationship with a primary  care physician if you do not have one) for your aftercare needs so that they can reassess your need for medications and monitor your lab values.  Discharge Instructions    Diet -  low sodium heart healthy    Complete by:  As directed      Discharge instructions    Complete by:  As directed   bmp 2 weeks     Increase activity slowly    Complete by:  As directed           Current Discharge Medication List    START taking these medications   Details  amLODipine (NORVASC) 5 MG tablet Take 1 tablet (5 mg total) by mouth daily. Qty: 30 tablet, Refills: 0    carvedilol (COREG) 12.5 MG tablet Take 1 tablet (12.5 mg total) by mouth 2 (two) times daily with a meal. Qty: 60 tablet, Refills: 0    furosemide (LASIX) 40 MG tablet Take 1 tablet (40 mg total) by mouth daily. Qty: 30 tablet, Refills: 0    isosorbide-hydrALAZINE (BIDIL) 20-37.5 MG per tablet Take 1 tablet by mouth 3 (three) times daily. Qty: 90 tablet, Refills: 0    losartan (COZAAR) 50 MG tablet Take 1 tablet (50 mg total) by mouth daily. Qty: 30 tablet, Refills: 0      STOP taking these medications     hydrochlorothiazide (HYDRODIURIL) 25 MG tablet        No Known Allergies Follow-up Information    Follow up with [facility] On 11/05/2014.   Why:  @ 1p/bring meds in bottles/photo id   Contact information:   [address] [place]  [phone]      Follow up with CROITORU,MIHAI, MD In 1 month.   Specialty:  Cardiology   Why:  needs repeat echo in 3 months   Contact information:   [address] [place] [address] [phone]        The results of significant diagnostics from this hospitalization (including imaging, microbiology, ancillary and laboratory) are listed below for reference.    Significant Diagnostic Studies: Dg Chest 2 View  10/19/2014   CLINICAL DATA:  One week history of cough, chest congestion, shortness of breath and mid chest pain. Former smoker who quit approximately 5 weeks ago. Current history of hypertension.  EXAM: CHEST  2 VIEW  COMPARISON:  08/29/2008.  FINDINGS: Moderate cardiac enlargement, with significant increase in heart size  since 2009. Moderate diffuse interstitial pulmonary edema. No confluent airspace consolidation. No pleural effusions. Visualized bony thorax intact.  IMPRESSION: CHF and/or fluid overload, with moderate cardiomegaly and moderate diffuse interstitial pulmonary edema. (Heart size has significantly increased since 2009).   Electronically Signed   By: Hulan Saashomas  Lawrence M.D.   On: 10/19/2014 11:36   [place] Renal  10/21/2014   CLINICAL DATA:  Acute renal failure.  EXAM: RENAL/URINARY TRACT ULTRASOUND COMPLETE  COMPARISON:  None.  FINDINGS: Right Kidney:  Length: 12.9 cm. Echogenicity within normal limits. No mass or hydronephrosis visualized.  Left Kidney:  Length: 12.7 cm. Echogenicity within normal limits. No mass or hydronephrosis visualized.  Bladder:  Not well visualized as it is decompressed.  IMPRESSION: Normal renal ultrasound.   Electronically Signed   By: Roque LiasJames  Green M.D.   On: 10/21/2014 13:16    Microbiology: No results found for this or any previous visit (from the past 240 hour(s)).   Labs: Basic Metabolic Panel:  Recent Labs Lab 10/19/14 1750  10/21/14 0555 10/22/14 [id number]  10/22/14 1312 10/23/14 0340 10/24/14 0442 10/25/14 0405  NA  --   < > 135* 137  --  138 139 139  K  --   < > 3.8 4.4  --  3.9 4.2 3.9  CL  --   < > 99 100  --  102 103 100  CO2  --   < > 22 25  --  21 21 24   GLUCOSE  --   < > 128* 109*  --  109* 97 91  BUN  --   < > 17 17  --  21 26* 23  CREATININE 1.50*  < > 1.51* 1.63* 1.30 1.55* 1.55* 1.50*  CALCIUM  --   < > 9.6 9.5  --  9.2 9.0 9.1  MG 2.0  --   --   --   --   --   --   --   < > = values in this interval not displayed. Liver Function Tests:  Recent Labs Lab 10/19/14 1038  AST 20  ALT 21  ALKPHOS 81  BILITOT 0.4  PROT 7.3  ALBUMIN 3.4*   No results for input(s): LIPASE, AMYLASE in the last 168 hours. No results for input(s): AMMONIA in the last 168 hours. CBC:  Recent Labs Lab 10/19/14 1038 10/19/14 1750 10/22/14 0508 10/22/14 1312  10/23/14 0340  WBC 7.1 8.7 8.9 7.4 7.8  NEUTROABS 4.2  --   --   --   --   HGB 14.4 15.0 14.2 14.6 13.5  HCT 42.6 44.3 43.2 43.7 40.9  MCV 85.0 85.0 86.6 86.2 85.7  PLT 299 315 354 346 339   Cardiac Enzymes:  Recent Labs Lab 10/19/14 1038 10/19/14 1750 10/20/14 0002 10/20/14 0436  TROPONINI <0.30 <0.30 <0.30 <0.30   BNP: BNP (last 3 results)  Recent Labs  10/19/14 1038  PROBNP 1941.0*   CBG: No results for input(s): GLUCAP in the last 168 hours.     SignedMarlin Canary:  Darthy Manganelli  [facility] 10/25/2014, 11:00 AM

## 2014-10-25 NOTE — Progress Notes (Signed)
Discharge education completed by RN. Pt received a copy of discharge paperwork and confirms understanding of follow up appointments and discharge medications. Denies any questions at this time. IV removed, site is within normal limits. Pt will discharge from the unit via wheelchair. 

## 2014-10-25 NOTE — Progress Notes (Signed)
Consulting cardiologist:Steve Carrillo Primary Cardiologist: Steve Carrillo  Cardiology Specific Problem List:  1. Uncontrolled hypertension 2. S/P cardiac cath with normal coronaries 1//19/2015 3. Hypertensive CM- EF of 35-40% 4. Acute on Chronic Systolic CHF   Subjective:    He is without complaint of chest pain, DOE, or weakness.   Objective:   Temp:  [97.8 F (36.6 C)-98.3 F (36.8 C)] 97.8 F (36.6 C) (11/22 0414) Pulse Rate:  [83-99] 95 (11/22 0414) Resp:  [18] 18 (11/22 0414) BP: (121-147)/(67-95) 143/95 mmHg (11/22 0414) SpO2:  [95 %-99 %] 95 % (11/22 0414) Weight:  [267 lb 3.2 oz (121.201 kg)] 267 lb 3.2 oz (121.201 kg) (11/22 0414) Last BM Date: 10/23/14  Filed Weights   10/23/14 0345 10/24/14 0300 10/25/14 0414  Weight: 267 lb 13.7 oz (121.5 kg) 268 lb 4.8 oz (121.7 kg) 267 lb 3.2 oz (121.201 kg)    Intake/Output Summary (Last 24 hours) at 10/25/14 0926 Last data filed at 10/24/14 1300  Gross per 24 hour  Intake    120 ml  Output      0 ml  Net    120 ml    Telemetry: NSR   Exam:  General: No acute distress.  HEENT: Normal  Neck: supple  Lungs: Clear to auscultation  Cardiac: RRR  Abdomen: nontender, nondistended.  Extremities: No edema  Neuro; grossly intact   Lab Results:  Basic Metabolic Panel:  Recent Labs Lab 10/19/14 1750  10/23/14 0340 10/24/14 0442 10/25/14 0405  NA  --   < > 138 139 139  K  --   < > 3.9 4.2 3.9  CL  --   < > 102 103 100  CO2  --   < > 21 21 24   GLUCOSE  --   < > 109* 97 91  BUN  --   < > 21 26* 23  CREATININE 1.50*  < > 1.55* 1.55* 1.50*  CALCIUM  --   < > 9.2 9.0 9.1  MG 2.0  --   --   --   --   < > = values in this interval not displayed.  Liver Function Tests:  Recent Labs Lab 10/19/14 1038  AST 20  ALT 21  ALKPHOS 81  BILITOT 0.4  PROT 7.3  ALBUMIN 3.4*    CBC:  Recent Labs Lab 10/22/14 0508 10/22/14 1312 10/23/14 0340  WBC 8.9 7.4 7.8  HGB 14.2 14.6 13.5  HCT 43.2  43.7 40.9  MCV 86.6 86.2 85.7  PLT 354 346 339    Cardiac Enzymes:  Recent Labs Lab 10/19/14 1750 10/20/14 0002 10/20/14 0436  TROPONINI <0.30 <0.30 <0.30    BNP:  Recent Labs  10/19/14 1038  PROBNP 1941.0*    Coagulation:  Recent Labs Lab 10/22/14 0508  INR 1.09    Radiology: CXR 10/19/2014 IMPRESSION: CHF and/or fluid overload, with moderate cardiomegaly and moderate diffuse interstitial pulmonary edema. (Heart size has significantly increased since 2009).  Echocardiogram 10/20/2014 Left ventricle: The global LV strain is abnormal at -10.9% The cavity size was mildly dilated. There was moderate concentric hypertrophy. Systolic function was moderately reduced. The estimated ejection fraction was in the range of 35% to 40%. Diffuse hypokinesis. There is akinesis of the basalinferoseptal myocardium. There is akinesis of the basal-midinferior myocardium. - Aortic valve: There was moderate regurgitation. - Aorta: Aortic root dimension: 37 mm (ED). - Ascending aorta: The ascending aorta was mildly dilated. - Mitral valve: There was mild to moderate regurgitation  directed eccentrically and toward the free wall. - Left atrium: The atrium was severely dilated. - Right ventricle: The cavity size was severely dilated. Wall thickness was normal. - Tricuspid valve: There was mild regurgitation. - Pulmonic valve: There was mild regurgitation.   Medications:   Scheduled Medications: . amLODipine  5 mg Oral Daily  . carvedilol  12.5 mg Oral BID WC  . furosemide  40 mg Oral Daily  . heparin  5,000 Units Subcutaneous 3 times per day  . isosorbide-hydrALAZINE  1 tablet Oral TID  . losartan  50 mg Oral Daily  . sodium chloride  3 mL Intravenous Q12H       PRN Medications: acetaminophen, hydrALAZINE, labetalol, ondansetron (ZOFRAN) IV, sodium chloride, traMADol   Assessment and Plan:   1. Hypertensive Cardiomyopathy: Blood pressure much  improved. Continue present medications. He can be discharged from a cardiac standpoint. He will need his potassium and renal function checked in 1 week. He will need a follow-up echocardiogram in 3 months. Hopefully his LV function will improve now the blood pressure controlled. Further titration of medications can be performed as an outpatient. I discussed the importance of compliance with medications and blood pressure control. He will need follow-up with Dr. Royann Carrillo.   2. Acute on Chronic Systolic CHF: Continue present dose of lasix Please call with questions.  Steve Carrillo

## 2014-10-25 NOTE — Progress Notes (Signed)
Utilization Review Completed.   Jazon Jipson, RN, BSN Nurse Case Manager  

## 2014-10-25 NOTE — Plan of Care (Signed)
Problem: Phase II Progression Outcomes Goal: Begin discharge teaching Outcome: Adequate for Discharge

## 2014-11-05 ENCOUNTER — Ambulatory Visit (INDEPENDENT_AMBULATORY_CARE_PROVIDER_SITE_OTHER): Payer: BC Managed Care – PPO | Admitting: Internal Medicine

## 2014-11-05 ENCOUNTER — Other Ambulatory Visit (INDEPENDENT_AMBULATORY_CARE_PROVIDER_SITE_OTHER): Payer: BC Managed Care – PPO

## 2014-11-05 ENCOUNTER — Encounter: Payer: Self-pay | Admitting: *Deleted

## 2014-11-05 ENCOUNTER — Encounter: Payer: Self-pay | Admitting: Internal Medicine

## 2014-11-05 VITALS — BP 132/80 | HR 86 | Temp 98.4°F | Resp 16 | Ht 73.0 in | Wt 273.0 lb

## 2014-11-05 DIAGNOSIS — Z0000 Encounter for general adult medical examination without abnormal findings: Secondary | ICD-10-CM

## 2014-11-05 DIAGNOSIS — I509 Heart failure, unspecified: Secondary | ICD-10-CM

## 2014-11-05 DIAGNOSIS — I10 Essential (primary) hypertension: Secondary | ICD-10-CM

## 2014-11-05 LAB — LIPID PANEL
Cholesterol: 221 mg/dL — ABNORMAL HIGH (ref 0–200)
HDL: 41.2 mg/dL (ref >39.00)
LDL CALC: 156 mg/dL — AB (ref 0–99)
NonHDL: 179.8
TRIGLYCERIDES: 118 mg/dL (ref 0.0–149.0)
Total CHOL/HDL Ratio: 5
VLDL: 23.6 mg/dL (ref 0.0–40.0)

## 2014-11-05 LAB — COMPREHENSIVE METABOLIC PANEL
ALK PHOS: 74 U/L (ref 39–117)
ALT: 25 U/L (ref 0–53)
AST: 17 U/L (ref 0–37)
Albumin: 3.9 g/dL (ref 3.5–5.2)
BUN: 17 mg/dL (ref 6–23)
CO2: 22 mEq/L (ref 19–32)
CREATININE: 1.4 mg/dL (ref 0.4–1.5)
Calcium: 9.1 mg/dL (ref 8.4–10.5)
Chloride: 108 mEq/L (ref 96–112)
GFR: 74.47 mL/min (ref >60.00)
Glucose, Bld: 86 mg/dL (ref 70–99)
Potassium: 3.5 mEq/L (ref 3.5–5.1)
Sodium: 140 mEq/L (ref 135–145)
Total Bilirubin: 0.3 mg/dL (ref 0.2–1.2)
Total Protein: 7.5 g/dL (ref 6.0–8.3)

## 2014-11-05 LAB — CBC WITH DIFFERENTIAL/PLATELET
BASOS PCT: 0.8 % (ref 0.0–3.0)
Basophils Absolute: 0.1 10*3/uL (ref 0.0–0.1)
EOS ABS: 0.4 10*3/uL (ref 0.0–0.7)
Eosinophils Relative: 5.4 % — ABNORMAL HIGH (ref 0.0–5.0)
HCT: 44.6 % (ref 39.0–52.0)
HEMOGLOBIN: 14.6 g/dL (ref 13.0–17.0)
Lymphocytes Relative: 40.8 % (ref 12.0–46.0)
Lymphs Abs: 3 10*3/uL (ref 0.7–4.0)
MCHC: 32.8 g/dL (ref 30.0–36.0)
MCV: 87.1 fl (ref 78.0–100.0)
MONO ABS: 0.8 10*3/uL (ref 0.1–1.0)
Monocytes Relative: 11.2 % (ref 3.0–12.0)
NEUTROS ABS: 3.1 10*3/uL (ref 1.4–7.7)
Neutrophils Relative %: 41.8 % — ABNORMAL LOW (ref 43.0–77.0)
Platelets: 417 10*3/uL — ABNORMAL HIGH (ref 150.0–400.0)
RBC: 5.11 Mil/uL (ref 4.22–5.81)
RDW: 13.9 % (ref 11.5–15.5)
WBC: 7.4 10*3/uL (ref 4.0–10.5)

## 2014-11-05 LAB — TSH: TSH: 1.59 u[IU]/mL (ref 0.35–4.50)

## 2014-11-05 NOTE — Assessment & Plan Note (Signed)
He has a normal volume status today 

## 2014-11-05 NOTE — Progress Notes (Signed)
Pre visit review using our clinic review tool, if applicable. No additional management support is needed unless otherwise documented below in the visit note. 

## 2014-11-05 NOTE — Patient Instructions (Signed)

## 2014-11-05 NOTE — Assessment & Plan Note (Signed)
Exam done Vaccines were reviewed and updated Labs ordered Pt ed material was given 

## 2014-11-05 NOTE — Progress Notes (Signed)
Subjective:    Patient ID: Steve Carrillo, male    DOB: September 10, 1981, 33 y.o.   MRN: [id number]  Hypertension This is a chronic problem. The current episode started more than 1 year ago. The problem has been gradually improving since onset. The problem is controlled. Pertinent negatives include no anxiety, blurred vision, chest pain, headaches, malaise/fatigue, neck pain, orthopnea, palpitations, peripheral edema, PND, shortness of breath or sweats. Past treatments include calcium channel blockers, beta blockers, diuretics, direct vasodilators and angiotensin blockers. The current treatment provides significant improvement. Compliance problems include diet and exercise.  Hypertensive end-organ damage includes heart failure.      Review of Systems  Constitutional: Negative for fever, chills, malaise/fatigue, diaphoresis, appetite change and fatigue.  HENT: Negative.   Eyes: Negative.  Negative for blurred vision.  Respiratory: Negative.  Negative for cough, choking, chest tightness and shortness of breath.   Cardiovascular: Negative.  Negative for chest pain, palpitations, orthopnea, leg swelling and PND.  Gastrointestinal: Negative.  Negative for nausea, abdominal pain, diarrhea, constipation, blood in stool and rectal pain.  Endocrine: Negative.   Genitourinary: Negative.   Musculoskeletal: Negative.  Negative for myalgias, back pain, arthralgias and neck pain.  Skin: Negative.  Negative for rash.  Allergic/Immunologic: Negative.   Neurological: Negative.  Negative for dizziness, tremors, facial asymmetry, light-headedness and headaches.  Hematological: Negative.  Negative for adenopathy. Does not bruise/bleed easily.  Psychiatric/Behavioral: Negative.        Objective:   Physical Exam  Constitutional: He is oriented to person, place, and time. He appears well-developed and well-nourished. No distress.  HENT:  Head: Normocephalic and atraumatic.  Mouth/Throat: Oropharynx is clear  and moist. No oropharyngeal exudate.  Eyes: Conjunctivae are normal. Right eye exhibits no discharge. Left eye exhibits no discharge. No scleral icterus.  Neck: Normal range of motion. Neck supple. No JVD present. No tracheal deviation present. No thyromegaly present.  Cardiovascular: Normal rate, regular rhythm, normal heart sounds and intact distal pulses.  Exam reveals no gallop and no friction rub.   No murmur heard. Pulmonary/Chest: Effort normal and breath sounds normal. No stridor. No respiratory distress. He has no wheezes. He has no rales. He exhibits no tenderness.  Abdominal: Soft. Bowel sounds are normal. He exhibits no distension and no mass. There is no tenderness. There is no rebound and no guarding. Hernia confirmed negative in the right inguinal area and confirmed negative in the left inguinal area.  Genitourinary: Testes normal and penis normal. Right testis shows no mass, no swelling and no tenderness. Right testis is descended. Left testis shows no mass, no swelling and no tenderness. Left testis is descended. Circumcised. No penile erythema or penile tenderness. No discharge found.  Musculoskeletal: Normal range of motion. He exhibits no edema or tenderness.  Lymphadenopathy:    He has no cervical adenopathy.       Right: No inguinal adenopathy present.       Left: No inguinal adenopathy present.  Neurological: He is oriented to person, place, and time.  Skin: Skin is warm and dry. No rash noted. He is not diaphoretic. No erythema. No pallor.  Vitals reviewed.     Lab Results  Component Value Date   WBC 7.8 10/23/2014   HGB 13.5 10/23/2014   HCT 40.9 10/23/2014   PLT 339 10/23/2014   GLUCOSE 91 10/25/2014   ALT 21 10/19/2014   AST 20 10/19/2014   NA 139 10/25/2014   K 3.9 10/25/2014   CL 100 10/25/2014  CREATININE 1.50* 10/25/2014   BUN 23 10/25/2014   CO2 24 10/25/2014   TSH 2.070 10/19/2014   INR 1.09 10/22/2014      Assessment & Plan:

## 2014-11-05 NOTE — Assessment & Plan Note (Signed)
His BP is well controlled 

## 2014-11-06 ENCOUNTER — Encounter: Payer: Self-pay | Admitting: Internal Medicine

## 2014-11-12 ENCOUNTER — Encounter (HOSPITAL_COMMUNITY): Payer: Self-pay | Admitting: Cardiology

## 2014-11-25 ENCOUNTER — Other Ambulatory Visit: Payer: Self-pay | Admitting: Geriatric Medicine

## 2014-11-25 ENCOUNTER — Telehealth: Payer: Self-pay | Admitting: Internal Medicine

## 2014-11-25 MED ORDER — AMLODIPINE BESYLATE 5 MG PO TABS: 5.0000 mg | ORAL_TABLET | Freq: Every day | ORAL | Status: DC

## 2014-11-25 MED ORDER — ISOSORB DINITRATE-HYDRALAZINE 20-37.5 MG PO TABS: 1.0000 | ORAL_TABLET | Freq: Three times a day (TID) | ORAL | Status: DC

## 2014-11-25 MED ORDER — LOSARTAN POTASSIUM 50 MG PO TABS: 50.0000 mg | ORAL_TABLET | Freq: Every day | ORAL | Status: DC

## 2014-11-25 MED ORDER — FUROSEMIDE 40 MG PO TABS: 40.0000 mg | ORAL_TABLET | Freq: Every day | ORAL | Status: DC

## 2014-11-25 NOTE — Telephone Encounter (Signed)
[facility] [address]

## 2014-11-25 NOTE — Telephone Encounter (Signed)
Needs all meds refilled [facility] [address]

## 2015-02-15 ENCOUNTER — Telehealth: Payer: Self-pay

## 2015-02-15 NOTE — Telephone Encounter (Signed)
Per the patient, he does not take the flu shots. Will discuss with his physician at the next office visit.

## 2015-04-06 ENCOUNTER — Other Ambulatory Visit: Payer: Self-pay

## 2015-04-06 MED ORDER — CARVEDILOL 12.5 MG PO TABS: 12.5000 mg | ORAL_TABLET | Freq: Two times a day (BID) | ORAL | Status: DC

## 2015-05-06 ENCOUNTER — Ambulatory Visit (INDEPENDENT_AMBULATORY_CARE_PROVIDER_SITE_OTHER): Payer: BC Managed Care – PPO | Admitting: Adult Health

## 2015-05-06 ENCOUNTER — Encounter: Payer: Self-pay | Admitting: Adult Health

## 2015-05-06 VITALS — BP 124/80 | HR 97 | Temp 99.1°F | Ht 73.0 in | Wt 302.8 lb

## 2015-05-06 DIAGNOSIS — R059 Cough, unspecified: Secondary | ICD-10-CM

## 2015-05-06 DIAGNOSIS — J069 Acute upper respiratory infection, unspecified: Secondary | ICD-10-CM | POA: Diagnosis not present

## 2015-05-06 DIAGNOSIS — R05 Cough: Secondary | ICD-10-CM | POA: Diagnosis not present

## 2015-05-06 MED ORDER — BENZONATATE 200 MG PO CAPS: 200.0000 mg | ORAL_CAPSULE | Freq: Three times a day (TID) | ORAL | Status: DC | PRN

## 2015-05-06 NOTE — Patient Instructions (Addendum)
I believe that you have a viral upper respiratory infection. Antibiotics are not indicated at this time. Please follow up if you are not feeling any better in a week. In the mean time, you can use Tessalon Pearls or over the counter Delsym for the cough. Ibuprofen for the body aches and get plenty of rest and drink a lot of fluids.   Upper Respiratory Infection, Adult An upper respiratory infection (URI) is also sometimes known as the common cold. The upper respiratory tract includes the nose, sinuses, throat, trachea, and bronchi. Bronchi are the airways leading to the lungs. Most people improve within 1 week, but symptoms can last up to 2 weeks. A residual cough may last even longer.  CAUSES Many different viruses can infect the tissues lining the upper respiratory tract. The tissues become irritated and inflamed and often become very moist. Mucus production is also common. A cold is contagious. You can easily spread the virus to others by oral contact. This includes kissing, sharing a glass, coughing, or sneezing. Touching your mouth or nose and then touching a surface, which is then touched by another person, can also spread the virus. SYMPTOMS  Symptoms typically develop 1 to 3 days after you come in contact with a cold virus. Symptoms vary from person to person. They may include:  Runny nose.  Sneezing.  Nasal congestion.  Sinus irritation.  Sore throat.  Loss of voice (laryngitis).  Cough.  Fatigue.  Muscle aches.  Loss of appetite.  Headache.  Low-grade fever. DIAGNOSIS  You might diagnose your own cold based on familiar symptoms, since most people get a cold 2 to 3 times a year. Your caregiver can confirm this based on your exam. Most importantly, your caregiver can check that your symptoms are not due to another disease such as strep throat, sinusitis, pneumonia, asthma, or epiglottitis. Blood tests, throat tests, and X-rays are not necessary to diagnose a common cold, but  they may sometimes be helpful in excluding other more serious diseases. Your caregiver will decide if any further tests are required. RISKS AND COMPLICATIONS  You may be at risk for a more severe case of the common cold if you smoke cigarettes, have chronic heart disease (such as heart failure) or lung disease (such as asthma), or if you have a weakened immune system. The very young and very old are also at risk for more serious infections. Bacterial sinusitis, middle ear infections, and bacterial pneumonia can complicate the common cold. The common cold can worsen asthma and chronic obstructive pulmonary disease (COPD). Sometimes, these complications can require emergency medical care and may be life-threatening. PREVENTION  The best way to protect against getting a cold is to practice good hygiene. Avoid oral or hand contact with people with cold symptoms. Wash your hands often if contact occurs. There is no clear evidence that vitamin C, vitamin E, echinacea, or exercise reduces the chance of developing a cold. However, it is always recommended to get plenty of rest and practice good nutrition. TREATMENT  Treatment is directed at relieving symptoms. There is no cure. Antibiotics are not effective, because the infection is caused by a virus, not by bacteria. Treatment may include:  Increased fluid intake. Sports drinks offer valuable electrolytes, sugars, and fluids.  Breathing heated mist or steam (vaporizer or shower).  Eating chicken soup or other clear broths, and maintaining good nutrition.  Getting plenty of rest.  Using gargles or lozenges for comfort.  Controlling fevers with ibuprofen or acetaminophen as  directed by your caregiver.  Increasing usage of your inhaler if you have asthma. Zinc gel and zinc lozenges, taken in the first 24 hours of the common cold, can shorten the duration and lessen the severity of symptoms. Pain medicines may help with fever, muscle aches, and throat  pain. A variety of non-prescription medicines are available to treat congestion and runny nose. Your caregiver can make recommendations and may suggest nasal or lung inhalers for other symptoms.  HOME CARE INSTRUCTIONS   Only take over-the-counter or prescription medicines for pain, discomfort, or fever as directed by your caregiver.  Use a warm mist humidifier or inhale steam from a shower to increase air moisture. This may keep secretions moist and make it easier to breathe.  Drink enough water and fluids to keep your urine clear or pale yellow.  Rest as needed.  Return to work when your temperature has returned to normal or as your caregiver advises. You may need to stay home longer to avoid infecting others. You can also use a face mask and careful hand washing to prevent spread of the virus. SEEK MEDICAL CARE IF:   After the first few days, you feel you are getting worse rather than better.  You need your caregiver's advice about medicines to control symptoms.  You develop chills, worsening shortness of breath, or brown or red sputum. These may be signs of pneumonia.  You develop yellow or brown nasal discharge or pain in the face, especially when you bend forward. These may be signs of sinusitis.  You develop a fever, swollen neck glands, pain with swallowing, or white areas in the back of your throat. These may be signs of strep throat. SEEK IMMEDIATE MEDICAL CARE IF:   You have a fever.  You develop severe or persistent headache, ear pain, sinus pain, or chest pain.  You develop wheezing, a prolonged cough, cough up blood, or have a change in your usual mucus (if you have chronic lung disease).  You develop sore muscles or a stiff neck. Document Released: 05/16/2001 Document Revised: 02/12/2012 Document Reviewed: 02/25/2014 [facility] Center[facility] Patient Information 2015 [place] EstatesExitCare, [place]. This information is not intended to replace advice given to you by your health care provider. Make sure  you discuss any questions you have with your health care provider.

## 2015-05-06 NOTE — Progress Notes (Signed)
Pre visit review using our clinic review tool, if applicable. No additional management support is needed unless otherwise documented below in the visit note. 

## 2015-05-06 NOTE — Progress Notes (Signed)
Subjective:    Patient ID: Steve Carrillo, male    DOB: 19-Oct-1981, 34 y.o.   MRN: [id number]  HPI  Patient presents to the office for cough ( non productive), congestion ( sinus), rhinorhea, sore throat, and body aches since Tuesday.   Denies fevers, nausea, vomiting, diarrhea.   Quit smoking in October.   Review of Systems  Constitutional: Negative.   HENT: Positive for congestion, rhinorrhea, sinus pressure and sore throat. Negative for ear discharge, ear pain, postnasal drip, tinnitus, trouble swallowing and voice change.   Respiratory: Positive for cough. Negative for shortness of breath and wheezing.   Gastrointestinal: Negative for nausea, vomiting, diarrhea and constipation.  Musculoskeletal: Positive for myalgias.  Neurological: Positive for headaches. Negative for dizziness and weakness.  Hematological: Negative for adenopathy.   Past Medical History  Diagnosis Date  . Hypertension   . 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 Marital Status: Single    Spouse Name: N/A  . Number of Children: N/A  . Years of Education: N/A   Occupational History  . Not on file.   Social History Main Topics  . Smoking status: Former Smoker    Quit date: 09/14/2014  . Smokeless tobacco: Never Used  . Alcohol Use: Yes     Comment: occasionally  . Drug Use: No  . Sexual Activity: Not on file   Other Topics Concern  . Not on file   Social History Narrative    Past Surgical History  Procedure Laterality Date  . Hernia repair    . Cardiac catheterization  10/2014    1. Normal coronary anatomy. Vessels are very large due to hypertensive cardiomyopathy.  . Left heart catheterization with coronary angiogram N/A 10/22/2014    Procedure: LEFT HEART CATHETERIZATION WITH CORONARY ANGIOGRAM;  Surgeon: Peter M SwazilandJordan, MD;  [facility];  Service: Cardiovascular;   Laterality: N/A;    No family history on file.  No Known Allergies  Current Outpatient Prescriptions on File Prior to Visit  Medication Sig Dispense Refill  . amLODipine (NORVASC) 5 MG tablet Take 1 tablet (5 mg total) by mouth daily. 90 tablet 1  . carvedilol (COREG) 12.5 MG tablet Take 1 tablet (12.5 mg total) by mouth 2 (two) times daily with a meal. 60 tablet 6  . furosemide (LASIX) 40 MG tablet Take 1 tablet (40 mg total) by mouth daily. 90 tablet 1  . isosorbide-hydrALAZINE (BIDIL) 20-37.5 MG per tablet Take 1 tablet by mouth 3 (three) times daily. 90 tablet 1  . losartan (COZAAR) 50 MG tablet Take 1 tablet (50 mg total) by mouth daily. 90 tablet 1   No current facility-administered medications on file prior to visit.    BP 124/80 mmHg  Pulse 97  Temp(Src) 99.1 F (37.3 C) (Oral)  Ht 6\' 1"  (1.854 m)  Wt 302 lb 12.8 oz (137.349 kg)  BMI 39.96 kg/m2  SpO2 97%       Objective:   Physical Exam  Constitutional: He is oriented to person, place, and time. He appears well-developed and well-nourished. No distress.  HENT:  Head: Normocephalic and atraumatic.  Right Ear: External ear normal.  Left Ear: External ear normal.  Nose: Nose normal.  Mouth/Throat: Oropharynx is clear and moist. No oropharyngeal exudate.  Cerumen in bilateral ear canals. TM not visualized  Eyes: Conjunctivae are normal. Pupils are equal,  round, and reactive to light. Right eye exhibits no discharge. Left eye exhibits no discharge.  Neck: No thyromegaly present.  Cardiovascular: Normal rate, regular rhythm, normal heart sounds and intact distal pulses.  Exam reveals no gallop and no friction rub.   No murmur heard. Pulmonary/Chest: Effort normal and breath sounds normal. No respiratory distress. He has no wheezes. He has no rales. He exhibits no tenderness.  Lymphadenopathy:    He has no cervical adenopathy.  Neurological: He is alert and oriented to person, place, and time.  Skin: Skin is warm and  dry. He is not diaphoretic.  Psychiatric: He has a normal mood and affect. His behavior is normal. Judgment and thought content normal.  Nursing note and vitals reviewed.      Assessment & Plan:  1. Acute upper respiratory infection - Likely viral  - Follow up if not feeling better in 2-3 days.  - Follow up sooner if symptoms worsen or fever greater than 101  2. Cough -  - benzonatate (TESSALON) 200 MG capsule; Take 1 capsule (200 mg total) by mouth 3 (three) times daily as needed for cough.  Dispense: 20 capsule; Refill: 1

## 2015-07-09 ENCOUNTER — Other Ambulatory Visit: Payer: Self-pay | Admitting: Internal Medicine

## 2015-10-06 ENCOUNTER — Emergency Department (HOSPITAL_COMMUNITY): Payer: BC Managed Care – PPO

## 2015-10-06 ENCOUNTER — Encounter (HOSPITAL_COMMUNITY): Payer: Self-pay | Admitting: Emergency Medicine

## 2015-10-06 ENCOUNTER — Emergency Department (HOSPITAL_COMMUNITY)
Admission: EM | Admit: 2015-10-06 | Discharge: 2015-10-06 | Disposition: A | Discharge status: Home / Self Care | Payer: BC Managed Care – PPO | Attending: Emergency Medicine | Admitting: Emergency Medicine

## 2015-10-06 DIAGNOSIS — Y998 Other external cause status: Secondary | ICD-10-CM | POA: Diagnosis not present

## 2015-10-06 DIAGNOSIS — Z9889 Other specified postprocedural states: Secondary | ICD-10-CM | POA: Diagnosis not present

## 2015-10-06 DIAGNOSIS — S76219A Strain of adductor muscle, fascia and tendon of unspecified thigh, initial encounter: Secondary | ICD-10-CM | POA: Insufficient documentation

## 2015-10-06 DIAGNOSIS — Y93E1 Activity, personal bathing and showering: Secondary | ICD-10-CM | POA: Diagnosis not present

## 2015-10-06 DIAGNOSIS — S3993XA Unspecified injury of pelvis, initial encounter: Secondary | ICD-10-CM | POA: Diagnosis present

## 2015-10-06 DIAGNOSIS — I110 Hypertensive heart disease with heart failure: Secondary | ICD-10-CM | POA: Diagnosis not present

## 2015-10-06 DIAGNOSIS — Z79899 Other long term (current) drug therapy: Secondary | ICD-10-CM | POA: Diagnosis not present

## 2015-10-06 DIAGNOSIS — Z87891 Personal history of nicotine dependence: Secondary | ICD-10-CM | POA: Insufficient documentation

## 2015-10-06 DIAGNOSIS — X58XXXA Exposure to other specified factors, initial encounter: Secondary | ICD-10-CM | POA: Diagnosis not present

## 2015-10-06 DIAGNOSIS — Y9289 Other specified places as the place of occurrence of the external cause: Secondary | ICD-10-CM | POA: Diagnosis not present

## 2015-10-06 DIAGNOSIS — I509 Heart failure, unspecified: Secondary | ICD-10-CM | POA: Insufficient documentation

## 2015-10-06 DIAGNOSIS — R52 Pain, unspecified: Secondary | ICD-10-CM

## 2015-10-06 MED ORDER — FUROSEMIDE 40 MG PO TABS: 40.0000 mg | ORAL_TABLET | Freq: Every day | ORAL | Status: DC

## 2015-10-06 MED ORDER — LOSARTAN POTASSIUM 50 MG PO TABS: 50.0000 mg | ORAL_TABLET | Freq: Every day | ORAL | Status: DC

## 2015-10-06 MED ORDER — IBUPROFEN 800 MG PO TABS: 800.0000 mg | ORAL_TABLET | Freq: Three times a day (TID) | ORAL | Status: DC

## 2015-10-06 MED ORDER — AMLODIPINE BESYLATE 5 MG PO TABS: 5.0000 mg | ORAL_TABLET | Freq: Every day | ORAL | Status: DC

## 2015-10-06 MED ORDER — HYDROCODONE-ACETAMINOPHEN 5-325 MG PO TABS: 2.0000 | ORAL_TABLET | ORAL | Status: DC | PRN

## 2015-10-06 NOTE — ED Provider Notes (Signed)
CSN: [id number]     Arrival date & time 10/06/15  1549 History   First MD Initiated Contact with Patient 10/06/15 1557     Chief Complaint  Patient presents with  . Pelvic Pain     (Consider location/radiation/quality/duration/timing/severity/associated sxs/prior Treatment) Patient is a 34 y.o. male presenting with groin pain. The history is provided by the patient. No language interpreter was used.  Groin Pain This is a new problem. The current episode started today. The problem occurs constantly. The problem has been gradually worsening. Associated symptoms include abdominal pain. Nothing aggravates the symptoms. He has tried nothing for the symptoms.  Pt reports he was in shower and moved his leg and had pain in groin.   Pain radiates to right testicle.    Past Medical History  Diagnosis Date  . Hypertension   . 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 Hernia repair    . Cardiac catheterization  10/2014    1. Normal coronary anatomy. Vessels are very large due to hypertensive cardiomyopathy.  . Left heart catheterization with coronary angiogram N/A 10/22/2014    Procedure: LEFT HEART CATHETERIZATION WITH CORONARY ANGIOGRAM;  Surgeon: Peter M Swaziland, MD;  Location: [facility] CATH LAB;  Service: Cardiovascular;  Laterality: N/A;   History reviewed. No pertinent family history. Social History  Substance Use Topics  . Smoking status: Former Smoker    Quit date: 09/14/2014  . Smokeless tobacco: Never Used  . Alcohol Use: Yes     Comment: occasionally    Review of Systems  Gastrointestinal: Positive for abdominal pain.  All other systems reviewed and are negative.     Allergies  Review of patient's allergies indicates no known allergies.  Home Medications   Prior to Admission medications   Medication Sig Start Date End Date Taking? Authorizing  Provider  carvedilol (COREG) 12.5 MG tablet Take 1 tablet (12.5 mg total) by mouth 2 (two) times daily with a meal. 04/06/15  Yes Etta Grandchild, MD  loratadine (CLARITIN) 10 MG tablet Take 10 mg by mouth daily as needed for allergies.   Yes Historical Provider, MD  amLODipine (NORVASC) 5 MG tablet TAKE ONE TABLET BY MOUTH ONCE DAILY 07/10/15   Etta Grandchild, MD  benzonatate (TESSALON) 200 MG capsule Take 1 capsule (200 mg total) by mouth 3 (three) times daily as needed for cough. 05/06/15   Shirline Frees, NP  furosemide (LASIX) 40 MG tablet TAKE ONE TABLET BY MOUTH ONCE DAILY 07/10/15   Etta Grandchild, MD  isosorbide-hydrALAZINE (BIDIL) 20-37.5 MG per tablet Take 1 tablet by mouth 3 (three) times daily. 11/25/14   Etta Grandchild, MD  losartan (COZAAR) 50 MG tablet TAKE ONE TABLET BY MOUTH ONCE DAILY 07/10/15   Etta Grandchild, MD   BP 176/126 mmHg  Pulse 98  Temp(Src) 99.1 F (37.3 C) (Oral)  Resp 18  SpO2 99% Physical Exam  Constitutional: He is oriented to person, place, and time. He appears well-developed and well-nourished.  HENT:  Head: Normocephalic and atraumatic.  Right Ear: External ear normal.  Nose: Nose normal.  Mouth/Throat: Oropharynx is clear and moist.  Eyes: EOM are normal. Pupils are equal, round, and reactive to light.  Neck: Normal range of motion.  Cardiovascular: Normal rate and normal heart sounds.   Pulmonary/Chest: Effort normal.  Abdominal: Soft. He exhibits no distension. There is  tenderness.  Genitourinary: Penis normal.  Tender right testicle,   Right scrotum larger,   Musculoskeletal: Normal range of motion.  Neurological: He is alert and oriented to person, place, and time.  Psychiatric: He has a normal mood and affect.  Nursing note and vitals reviewed.   ED Course  Procedures (including critical care time) Labs Review Labs Reviewed - No data to display  Imaging Review US Scrotum  10/06/2015  CLINICAL DATA:  RIGHT-side pain, history hypertension,  cardiomyopathy EXAM: SCROTAL ULTRASOUND DOPPLER ULTRASOUND OF THE TESTICLES TECHNIQUE: Complete ultrasound examination of the testicles, epididymis, and other scrotal structures was performed. Color and spectral Doppler ultrasound were also utilized to evaluate blood flow to the testicles. COMPARISON:  None. 11/29/2006 FINDINGS: Right testicle Measurements: 5.5 x 2.4 x 3.1 cm. Normal morphology without mass or calcification. Internal blood flow present on color Doppler imaging. Left testicle Measurements: 5.2 x 2.6 x 2.9 cm. Normal morphology without mass or calcification. Internal blood flow present on color Doppler imaging. Right epididymis: Normal size and echogenicity. Tiny cysts within RIGHT epididymal head up to 3 mm diameter. Left epididymis: Normal size and echogenicity. Tiny cysts in LEFT epididymal head up to 3 mm diameter. Hydrocele:  Tiny RIGHT hydrocele.  No LEFT hydrocele. Varicocele:  Mild BILATERAL varicoceles noted. Duplex sonography of the testes demonstrates intra testicular venous and low resistance arterial blood flow bilaterally. IMPRESSION: Tiny BILATERAL epididymal cysts. BILATERAL varicoceles. No evidence of testicular mass or torsion. Electronically Signed   By: Ulyses Southward M.D.   On: 10/06/2015 17:11   [place] Art/ven Flow Abd Pelv Doppler  10/06/2015  CLINICAL DATA:  RIGHT-side pain, history hypertension, cardiomyopathy EXAM: SCROTAL ULTRASOUND DOPPLER ULTRASOUND OF THE TESTICLES TECHNIQUE: Complete ultrasound examination of the testicles, epididymis, and other scrotal structures was performed. Color and spectral Doppler ultrasound were also utilized to evaluate blood flow to the testicles. COMPARISON:  None. 11/29/2006 FINDINGS: Right testicle Measurements: 5.5 x 2.4 x 3.1 cm. Normal morphology without mass or calcification. Internal blood flow present on color Doppler imaging. Left testicle Measurements: 5.2 x 2.6 x 2.9 cm. Normal morphology without mass or calcification. Internal blood  flow present on color Doppler imaging. Right epididymis: Normal size and echogenicity. Tiny cysts within RIGHT epididymal head up to 3 mm diameter. Left epididymis: Normal size and echogenicity. Tiny cysts in LEFT epididymal head up to 3 mm diameter. Hydrocele:  Tiny RIGHT hydrocele.  No LEFT hydrocele. Varicocele:  Mild BILATERAL varicoceles noted. Duplex sonography of the testes demonstrates intra testicular venous and low resistance arterial blood flow bilaterally. IMPRESSION: Tiny BILATERAL epididymal cysts. BILATERAL varicoceles. No evidence of testicular mass or torsion. Electronically Signed   By: Ulyses Southward M.D.   On: 10/06/2015 17:11   I have personally reviewed and evaluated these images and lab results as part of my medical decision-making.   EKG Interpretation None      MDM  Pt counseled no evidence of hernia.   I doubt stone or urinary symptom.   I suspect muscle strain.   Pt advised of high blood pressure.  He is advised to follow up with his Md.   I reviewed his medications and gve him prescriptions for 3 that I can tell are current.   Final diagnoses:  Groin strain, initial encounter    An After Visit Summary was printed and given to the patient. Meds ordered this encounter  Medications  . loratadine (CLARITIN) 10 MG tablet    Sig: Take 10 mg by mouth daily as  needed for allergies.  Marland Kitchen. HYDROcodone-acetaminophen (NORCO/VICODIN) 5-325 MG tablet    Sig: Take 2 tablets by mouth every 4 (four) hours as needed.    Dispense:  16 tablet    Refill:  0  . ibuprofen (ADVIL,MOTRIN) 800 MG tablet    Sig: Take 1 tablet (800 mg total) by mouth 3 (three) times daily.    Dispense:  21 tablet    Refill:  0  . amLODipine (NORVASC) 5 MG tablet    Sig: Take 1 tablet (5 mg total) by mouth daily.    Dispense:  90 tablet    Refill:  0  . furosemide (LASIX) 40 MG tablet    Sig: Take 1 tablet (40 mg total) by mouth daily.    Dispense:  90 tablet    Refill:  0  . losartan (COZAAR) 50 MG  tablet    Sig: Take 1 tablet (50 mg total) by mouth daily.    Dispense:  90 tablet    Refill:  0    Lonia SkinnerLeslie K WorthingtonSofia, PA-C 10/06/15 1838  Vanetta MuldersScott Zackowski, MD 10/07/15 [phone]

## 2015-10-06 NOTE — ED Notes (Addendum)
Pt states that he feels as if he pulled something in his R lower pelvic region. States that he felt it pull when he was in the shower. Denies swelling or penile discharge. Pt also has hypertension and is out of all of his BP meds. Alert and oriented.

## 2015-10-06 NOTE — Discharge Instructions (Signed)
Groin Strain  A groin strain (also called a groin pull) is an injury to the muscles or tendon on the upper inner part of the thigh. These muscles are called the adductor muscles or groin muscles. They are responsible for moving the leg across the body. A muscle strain occurs when a muscle is overstretched and some muscle fibers are torn. A groin strain can range from mild to severe depending on how many muscle fibers are affected and whether the muscle fibers are partially or completely torn.   Groin strains usually occur during exercise or participation in sports. The injury often happens when a sudden, violent force is placed on a muscle, stretching the muscle too far. A strain is more likely to occur when your muscles are not warmed up or if you are not properly conditioned. Depending on the severity of the groin strain, recovery time may vary from a few weeks to several weeks. Severe injuries often require 4-6 weeks for recovery. In these cases, complete healing can take 4-5 months.   CAUSES    Stretching the groin muscles too far or too suddenly, often during side-to-side motion with an abrupt change in direction.   Putting repeated stress on the groin muscles over a long period of time.   Performing vigorous activity without properly stretching the groin muscles beforehand.  SYMPTOMS    Pain and tenderness in the groin area. This begins as sharp pain and persists as a dull ache.   Popping or snapping feeling when the injury occurs (for severe strains).   Swelling or bruising.   Muscle spasms.   Weakness in the leg.   Stiffness in the groin area with decreased ability to move the affected muscles.  DIAGNOSIS   Your caregiver will perform a physical exam to diagnose a groin strain. You will be asked about your symptoms and how the injury occurred. X-rays are sometimes needed to rule out a broken bone or cartilage problems. Your caregiver may order a CT scan or MRI if a complete muscle tear is  suspected.  TREATMENT   A groin strain will often heal on its own. Your caregiver may prescribe medicines to help manage pain and swelling (anti-inflammatory medicine). You may be told to use crutches for the first few days to minimize your pain.  HOME CARE INSTRUCTIONS    Rest. Do not use the strained muscle if it causes pain.   Put ice on the injured area.   Put ice in a plastic bag.   Place a towel between your skin and the bag.   Leave the ice on for 15-20 minutes, every 2-3 hours. Do this for the first 2 days after the injury.   Only take over-the-counter or prescription medicines as directed by your caregiver.   Wrap the injured area with an elastic bandage as directed by your caregiver.   Keep the injured leg raised (elevated).   Walk, stretch, and perform range-of-motion exercises to improve blood flow to the injured area. Only perform these activities if you can do so without any pain.  To prevent muscle strains:   Warm up before exercise.   Develop proper conditioning and strength in the groin muscles.  SEEK IMMEDIATE MEDICAL CARE IF:    You have increased pain or swelling in the affected area.    Your symptoms are not improving or are getting worse.  MAKE SURE YOU:    Understand these instructions.   Will watch your condition.   Will get help   right away if you are not doing well or get worse.     This information is not intended to replace advice given to you by your health care provider. Make sure you discuss any questions you have with your health care provider.     Document Released: 07/18/2004 Document Revised: 11/06/2012 Document Reviewed: 07/24/2012  [employer]vier Interactive Patient Education 2016 [employer]

## 2015-10-06 NOTE — ED Notes (Signed)
Ultrasound in with pt 

## 2016-02-06 ENCOUNTER — Emergency Department (HOSPITAL_COMMUNITY): Payer: BC Managed Care – PPO

## 2016-02-06 ENCOUNTER — Inpatient Hospital Stay (HOSPITAL_COMMUNITY)
Admission: EM | Admit: 2016-02-06 | Discharge: 2016-02-09 | DRG: 291 | Disposition: A | Discharge status: Home / Self Care | Payer: BC Managed Care – PPO | Attending: Internal Medicine | Admitting: Internal Medicine

## 2016-02-06 ENCOUNTER — Encounter (HOSPITAL_COMMUNITY): Payer: Self-pay | Admitting: Family Medicine

## 2016-02-06 DIAGNOSIS — I130 Hypertensive heart and chronic kidney disease with heart failure and stage 1 through stage 4 chronic kidney disease, or unspecified chronic kidney disease: Principal | ICD-10-CM | POA: Diagnosis present

## 2016-02-06 DIAGNOSIS — Z9114 Patient's other noncompliance with medication regimen: Secondary | ICD-10-CM

## 2016-02-06 DIAGNOSIS — I160 Hypertensive urgency: Secondary | ICD-10-CM

## 2016-02-06 DIAGNOSIS — I509 Heart failure, unspecified: Secondary | ICD-10-CM

## 2016-02-06 DIAGNOSIS — Z79899 Other long term (current) drug therapy: Secondary | ICD-10-CM

## 2016-02-06 DIAGNOSIS — Z87891 Personal history of nicotine dependence: Secondary | ICD-10-CM

## 2016-02-06 DIAGNOSIS — R05 Cough: Secondary | ICD-10-CM | POA: Diagnosis present

## 2016-02-06 DIAGNOSIS — R739 Hyperglycemia, unspecified: Secondary | ICD-10-CM | POA: Diagnosis present

## 2016-02-06 DIAGNOSIS — E876 Hypokalemia: Secondary | ICD-10-CM | POA: Diagnosis present

## 2016-02-06 DIAGNOSIS — I5041 Acute combined systolic (congestive) and diastolic (congestive) heart failure: Secondary | ICD-10-CM | POA: Diagnosis present

## 2016-02-06 DIAGNOSIS — I5043 Acute on chronic combined systolic (congestive) and diastolic (congestive) heart failure: Secondary | ICD-10-CM

## 2016-02-06 DIAGNOSIS — N189 Chronic kidney disease, unspecified: Secondary | ICD-10-CM | POA: Diagnosis present

## 2016-02-06 DIAGNOSIS — I43 Cardiomyopathy in diseases classified elsewhere: Secondary | ICD-10-CM | POA: Diagnosis present

## 2016-02-06 DIAGNOSIS — R0602 Shortness of breath: Secondary | ICD-10-CM | POA: Diagnosis not present

## 2016-02-06 DIAGNOSIS — Z8249 Family history of ischemic heart disease and other diseases of the circulatory system: Secondary | ICD-10-CM

## 2016-02-06 DIAGNOSIS — Z6834 Body mass index (BMI) 34.0-34.9, adult: Secondary | ICD-10-CM

## 2016-02-06 DIAGNOSIS — Z7982 Long term (current) use of aspirin: Secondary | ICD-10-CM

## 2016-02-06 DIAGNOSIS — R7303 Prediabetes: Secondary | ICD-10-CM

## 2016-02-06 DIAGNOSIS — R000 Tachycardia, unspecified: Secondary | ICD-10-CM | POA: Diagnosis present

## 2016-02-06 DIAGNOSIS — E669 Obesity, unspecified: Secondary | ICD-10-CM | POA: Diagnosis present

## 2016-02-06 DIAGNOSIS — R509 Fever, unspecified: Secondary | ICD-10-CM | POA: Diagnosis present

## 2016-02-06 DIAGNOSIS — E785 Hyperlipidemia, unspecified: Secondary | ICD-10-CM | POA: Diagnosis present

## 2016-02-06 DIAGNOSIS — Z833 Family history of diabetes mellitus: Secondary | ICD-10-CM

## 2016-02-06 HISTORY — DX: Hyperlipidemia, unspecified: E78.5

## 2016-02-06 LAB — CBC
HCT: 43.9 % (ref 39.0–52.0)
HEMOGLOBIN: 14.8 g/dL (ref 13.0–17.0)
MCH: 28.6 pg (ref 26.0–34.0)
MCHC: 33.7 g/dL (ref 30.0–36.0)
MCV: 84.9 fL (ref 78.0–100.0)
PLATELETS: 329 10*3/uL (ref 150–400)
RBC: 5.17 MIL/uL (ref 4.22–5.81)
RDW: 13.8 % (ref 11.5–15.5)
WBC: 6.9 10*3/uL (ref 4.0–10.5)

## 2016-02-06 LAB — I-STAT TROPONIN, ED: TROPONIN I, POC: 0.04 ng/mL (ref 0.00–0.08)

## 2016-02-06 NOTE — ED Notes (Signed)
Patient reports he was at a movie theater when he became short of breath, followed by chest pain. Pt reports this has happen about 2 years ago and dx with HTN and CHF. Patient has not took any medication for these symptoms.

## 2016-02-06 NOTE — ED Provider Notes (Signed)
CSN: [id number]     Arrival date & time 02/06/16  2301 History  By signing my name below, I, Linus Galas, attest that this documentation has been prepared under the direction and in the presence of Arby Barrette, MD. Electronically Signed: Linus Galas, ED Scribe. 02/07/2016. 11:45 PM.  Chief Complaint  Patient presents with  . Chest Pain  . Shortness of Breath   The history is provided by the patient. No language interpreter was used.   HPI Comments: Steve Carrillo is a 35 y.o. male with a PMHx of HTN and CHF who presents to the Emergency Department complaining of SOB that began 45 minutes ago, PTA. Pt reports he felt like he was going to "pass out" with associated diaphoresis and nausea. Pt reports his symptoms began while he was at the movie theater. He states he had similar symptoms 2 years ago. Pt denies any vomiting, leg swelling or any other symptoms at this time. Pt has only been taking furosemide and potassium chloride. Pt has been non-complaint with BP medication for the past 3 months.  Pt last saw Sanda Linger, MD about 1 year ago. Pt states that he gets his BP medication refilled by calling his PCP.   Past Medical History  Diagnosis Date  . Hypertension   . 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 Hernia repair    . Cardiac catheterization  10/2014    1. Normal coronary anatomy. Vessels are very large due to hypertensive cardiomyopathy.  . Left heart catheterization with coronary angiogram N/A 10/22/2014    Procedure: LEFT HEART CATHETERIZATION WITH CORONARY ANGIOGRAM;  Surgeon: Peter M Swaziland, MD;  Location: [facility] CATH LAB;  Service: Cardiovascular;  Laterality: N/A;   History reviewed. No pertinent family history. Social History  Substance Use Topics  . Smoking status: Former Smoker    Quit date: 09/14/2014  . Smokeless tobacco: Never Used   . Alcohol Use: Yes     Comment: Twice a month    Review of Systems A complete 10 system review of systems was obtained and all systems are negative except as noted in the HPI and PMH.   Allergies  Review of patient's allergies indicates no known allergies.  Home Medications   Prior to Admission medications   Medication Sig Start Date End Date Taking? Authorizing Provider  furosemide (LASIX) 40 MG tablet Take 1 tablet (40 mg total) by mouth daily. 10/06/15  Yes Lonia Skinner Sofia, PA-C  potassium chloride (KLOR-CON) 20 MEQ packet Take 20 mEq by mouth daily.   Yes Historical Provider, MD  amLODipine (NORVASC) 5 MG tablet Take 1 tablet (5 mg total) by mouth daily. Patient not taking: Reported on 02/06/2016 10/06/15   Elson Areas, PA-C  benzonatate (TESSALON) 200 MG capsule Take 1 capsule (200 mg total) by mouth 3 (three) times daily as needed for cough. Patient not taking: Reported on 02/06/2016 05/06/15   Shirline Frees, NP  HYDROcodone-acetaminophen (NORCO/VICODIN) 5-325 MG tablet Take 2 tablets by mouth every 4 (four) hours as needed. Patient not taking: Reported on 02/06/2016 10/06/15   Elson Areas, PA-C  ibuprofen (ADVIL,MOTRIN) 800 MG tablet Take 1 tablet (800 mg total) by mouth 3 (three) times daily. Patient not taking: Reported on 02/06/2016 10/06/15   Elson Areas, PA-C  isosorbide-hydrALAZINE (BIDIL) 20-37.5 MG per tablet Take 1 tablet by mouth 3 (three)  times daily. Patient not taking: Reported on 02/06/2016 11/25/14   Etta Grandchild, MD  losartan (COZAAR) 50 MG tablet Take 1 tablet (50 mg total) by mouth daily. Patient not taking: Reported on 02/06/2016 10/06/15   Elson Areas, PA-C   BP 108/67 mmHg  Pulse 93  Temp(Src) 99.1 F (37.3 C) (Oral)  Resp 16  Ht  (1.854 m)  Wt 260 lb (117.935 kg)  BMI 34.31 kg/m2  SpO2 95%   Physical Exam  Constitutional: He is oriented to person, place, and time. He appears well-developed and well-nourished.  HENT:  Head: Normocephalic and  atraumatic.  Cardiovascular: Tachycardia present.  Exam reveals no gallop and no friction rub.   No murmur heard. Pulmonary/Chest: Effort normal and breath sounds normal. No respiratory distress. He has no wheezes. He has no rales.  Abdominal: He exhibits no distension.  Musculoskeletal: He exhibits no edema or tenderness.  No calf tenderness; no peripheral edema   Neurological: He is alert and oriented to person, place, and time. He exhibits normal muscle tone. Coordination normal.  Skin: Skin is warm and dry.  Psychiatric: He has a normal mood and affect.  Nursing note and vitals reviewed.   ED Course  Procedures  CRITICAL CARE Performed by: Arby Barrette   Total critical care time: 30 minutes  Critical care time was exclusive of separately billable procedures and treating other patients.  Critical care was necessary to treat or prevent imminent or life-threatening deterioration.  Critical care was time spent personally by me on the following activities: development of treatment plan with patient and/or surrogate as well as nursing, discussions with consultants, evaluation of patient's response to treatment, examination of patient, obtaining history from patient or surrogate, ordering and performing treatments and interventions, ordering and review of laboratory studies, ordering and review of radiographic studies, pulse oximetry and re-evaluation of patient's condition.  DIAGNOSTIC STUDIES: Oxygen Saturation is 92% on room air, low by my interpretation.    COORDINATION OF CARE: 11:40 PM Will order CXR, blood work and EKG Discussed treatment plan with pt at bedside and pt agreed to plan.  Labs Review Labs Reviewed  BASIC METABOLIC PANEL - Abnormal; Notable for the following:    Potassium 3.2 (*)    Glucose, Bld 135 (*)    Creatinine, Ser 1.41 (*)    Calcium 8.6 (*)    All other components within normal limits  BRAIN NATRIURETIC PEPTIDE - Abnormal; Notable for the  following:    B Natriuretic Peptide 242.9 (*)    All other components within normal limits  CBC  D-DIMER, QUANTITATIVE (NOT AT [facility])  Rosezena Sensor, ED    Imaging Review Dg Chest 2 View  02/07/2016  CLINICAL DATA:  Acute onset of shortness of breath and chest pain. EXAM: CHEST  2 VIEW COMPARISON:  Radiograph 10/19/2014 FINDINGS: Cardiomegaly is unchanged. There is mild pulmonary edema. Minimal fluid in the fissure without large subpulmonic effusion. No confluent airspace disease. No pneumothorax. Osseous structures are intact. IMPRESSION: Stable cardiomegaly.  Mild pulmonary edema.  Suspect mild CHF. Electronically Signed   By: Rubye Oaks M.D.   On: 02/07/2016 00:04   I have personally reviewed and evaluated these images and lab results as part of my medical decision-making.   EKG Interpretation   Date/Time:  Sunday February 06 2016 23:10:35 EST Ventricular Rate:  114 PR Interval:  159 QRS Duration: 99 QT Interval:  348 QTC Calculation: 479 R Axis:   44 Text Interpretation:  Sinus tachycardia  Biatrial enlargement Borderline  prolonged QT interval no change from old Confirmed by Donnald GarrePfeiffer, MD, Lebron ConnersMarcy  [phone]([id number]) on 02/06/2016 11:33:22 PM     Consult: Dr. Toniann FailKakrakandy for admission MDM   Final diagnoses:  Hypertensive urgency, malignant  Acute congestive heart failure, unspecified congestive heart failure type [facility](HCC)   Patient has prior history of congestive heart failure with malignant hypertension episode 2 years ago. Patient reports he is been asymptomatic until this evening.  He reports becoming short of breath and feeling nauseated and sweaty. Patient's  Initial blood pressure is 207/132.  He has not been compliant with his blood pressure medications citing inability to get them.He reports consistently taking Lasix and potassium.  At this time chest x-ray shows vascular congestion and BMP is elevated. Cardiac enzymes do not show elevation first EKG does not have ischemic change  compared to old. Patient has been treated for hypertension with hydralazine, Cozaar and nitroglycerin paste. Patient has not developed chest pain.  Mental status remains clear. Shortness of breath has improved although he still endorses mild shortness of breath. Patient will be admitted at this time for ongoing treatment and evaluation.   Arby BarretteMarcy Cheral Cappucci, MD 02/07/16 [phone]

## 2016-02-07 ENCOUNTER — Encounter (HOSPITAL_COMMUNITY): Payer: Self-pay | Admitting: Internal Medicine

## 2016-02-07 ENCOUNTER — Inpatient Hospital Stay (HOSPITAL_COMMUNITY): Payer: BC Managed Care – PPO

## 2016-02-07 DIAGNOSIS — I160 Hypertensive urgency: Secondary | ICD-10-CM | POA: Diagnosis present

## 2016-02-07 DIAGNOSIS — I5041 Acute combined systolic (congestive) and diastolic (congestive) heart failure: Secondary | ICD-10-CM | POA: Diagnosis not present

## 2016-02-07 DIAGNOSIS — E669 Obesity, unspecified: Secondary | ICD-10-CM | POA: Diagnosis present

## 2016-02-07 DIAGNOSIS — I5043 Acute on chronic combined systolic (congestive) and diastolic (congestive) heart failure: Secondary | ICD-10-CM | POA: Diagnosis present

## 2016-02-07 DIAGNOSIS — Z7982 Long term (current) use of aspirin: Secondary | ICD-10-CM | POA: Diagnosis not present

## 2016-02-07 DIAGNOSIS — Z87891 Personal history of nicotine dependence: Secondary | ICD-10-CM | POA: Diagnosis not present

## 2016-02-07 DIAGNOSIS — Z79899 Other long term (current) drug therapy: Secondary | ICD-10-CM | POA: Diagnosis not present

## 2016-02-07 DIAGNOSIS — Z833 Family history of diabetes mellitus: Secondary | ICD-10-CM | POA: Diagnosis not present

## 2016-02-07 DIAGNOSIS — R0600 Dyspnea, unspecified: Secondary | ICD-10-CM | POA: Diagnosis not present

## 2016-02-07 DIAGNOSIS — R509 Fever, unspecified: Secondary | ICD-10-CM | POA: Diagnosis present

## 2016-02-07 DIAGNOSIS — R739 Hyperglycemia, unspecified: Secondary | ICD-10-CM | POA: Diagnosis present

## 2016-02-07 DIAGNOSIS — R000 Tachycardia, unspecified: Secondary | ICD-10-CM | POA: Diagnosis present

## 2016-02-07 DIAGNOSIS — E785 Hyperlipidemia, unspecified: Secondary | ICD-10-CM | POA: Diagnosis present

## 2016-02-07 DIAGNOSIS — I509 Heart failure, unspecified: Secondary | ICD-10-CM | POA: Diagnosis not present

## 2016-02-07 DIAGNOSIS — R7302 Impaired glucose tolerance (oral): Secondary | ICD-10-CM | POA: Diagnosis not present

## 2016-02-07 DIAGNOSIS — I43 Cardiomyopathy in diseases classified elsewhere: Secondary | ICD-10-CM | POA: Diagnosis present

## 2016-02-07 DIAGNOSIS — E876 Hypokalemia: Secondary | ICD-10-CM | POA: Diagnosis present

## 2016-02-07 DIAGNOSIS — I130 Hypertensive heart and chronic kidney disease with heart failure and stage 1 through stage 4 chronic kidney disease, or unspecified chronic kidney disease: Secondary | ICD-10-CM | POA: Diagnosis present

## 2016-02-07 DIAGNOSIS — Z8249 Family history of ischemic heart disease and other diseases of the circulatory system: Secondary | ICD-10-CM | POA: Diagnosis not present

## 2016-02-07 DIAGNOSIS — R05 Cough: Secondary | ICD-10-CM | POA: Diagnosis present

## 2016-02-07 DIAGNOSIS — Z6834 Body mass index (BMI) 34.0-34.9, adult: Secondary | ICD-10-CM | POA: Diagnosis not present

## 2016-02-07 DIAGNOSIS — N189 Chronic kidney disease, unspecified: Secondary | ICD-10-CM | POA: Diagnosis present

## 2016-02-07 DIAGNOSIS — Z9114 Patient's other noncompliance with medication regimen: Secondary | ICD-10-CM | POA: Diagnosis not present

## 2016-02-07 DIAGNOSIS — R0602 Shortness of breath: Secondary | ICD-10-CM | POA: Diagnosis present

## 2016-02-07 DIAGNOSIS — I5021 Acute systolic (congestive) heart failure: Secondary | ICD-10-CM

## 2016-02-07 LAB — CBC WITH DIFFERENTIAL/PLATELET
BASOS PCT: 0 %
Basophils Absolute: 0 10*3/uL (ref 0.0–0.1)
EOS ABS: 0.1 10*3/uL (ref 0.0–0.7)
EOS PCT: 1 %
HCT: 42.8 % (ref 39.0–52.0)
HEMOGLOBIN: 14 g/dL (ref 13.0–17.0)
Lymphocytes Relative: 23 %
Lymphs Abs: 1.5 10*3/uL (ref 0.7–4.0)
MCH: 28.2 pg (ref 26.0–34.0)
MCHC: 32.7 g/dL (ref 30.0–36.0)
MCV: 86.1 fL (ref 78.0–100.0)
MONOS PCT: 5 %
Monocytes Absolute: 0.4 10*3/uL (ref 0.1–1.0)
NEUTROS PCT: 71 %
Neutro Abs: 4.8 10*3/uL (ref 1.7–7.7)
PLATELETS: 324 10*3/uL (ref 150–400)
RBC: 4.97 MIL/uL (ref 4.22–5.81)
RDW: 14 % (ref 11.5–15.5)
WBC: 6.8 10*3/uL (ref 4.0–10.5)

## 2016-02-07 LAB — COMPREHENSIVE METABOLIC PANEL
ALBUMIN: 3.8 g/dL (ref 3.5–5.0)
ALT: 30 U/L (ref 17–63)
AST: 29 U/L (ref 15–41)
Alkaline Phosphatase: 79 U/L (ref 38–126)
Anion gap: 8 (ref 5–15)
BUN: 15 mg/dL (ref 6–20)
CALCIUM: 8.6 mg/dL — AB (ref 8.9–10.3)
CO2: 25 mmol/L (ref 22–32)
CREATININE: 1.4 mg/dL — AB (ref 0.61–1.24)
Chloride: 105 mmol/L (ref 101–111)
GFR calc Af Amer: >60 mL/min (ref >60)
GFR calc non Af Amer: >60 mL/min (ref >60)
Glucose, Bld: 142 mg/dL — ABNORMAL HIGH (ref 65–99)
POTASSIUM: 3.3 mmol/L — AB (ref 3.5–5.1)
SODIUM: 138 mmol/L (ref 135–145)
Total Bilirubin: 0.7 mg/dL (ref 0.3–1.2)
Total Protein: 7.1 g/dL (ref 6.5–8.1)

## 2016-02-07 LAB — BASIC METABOLIC PANEL
Anion gap: 10 (ref 5–15)
BUN: 16 mg/dL (ref 6–20)
CALCIUM: 8.6 mg/dL — AB (ref 8.9–10.3)
CHLORIDE: 104 mmol/L (ref 101–111)
CO2: 25 mmol/L (ref 22–32)
CREATININE: 1.41 mg/dL — AB (ref 0.61–1.24)
GFR calc Af Amer: >60 mL/min (ref >60)
GFR calc non Af Amer: >60 mL/min (ref >60)
GLUCOSE: 135 mg/dL — AB (ref 65–99)
Potassium: 3.2 mmol/L — ABNORMAL LOW (ref 3.5–5.1)
Sodium: 139 mmol/L (ref 135–145)

## 2016-02-07 LAB — INFLUENZA PANEL BY PCR (TYPE A & B)
H1N1FLUPCR: NOT DETECTED
Influenza A By PCR: NEGATIVE
Influenza B By PCR: NEGATIVE

## 2016-02-07 LAB — TSH: TSH: 0.567 u[IU]/mL (ref 0.350–4.500)

## 2016-02-07 LAB — BRAIN NATRIURETIC PEPTIDE: B Natriuretic Peptide: 242.9 pg/mL — ABNORMAL HIGH (ref 0.0–100.0)

## 2016-02-07 LAB — TROPONIN I
TROPONIN I: 0.03 ng/mL (ref <0.031)
TROPONIN I: 0.03 ng/mL (ref <0.031)
Troponin I: 0.03 ng/mL (ref <0.031)

## 2016-02-07 LAB — D-DIMER, QUANTITATIVE: D-Dimer, Quant: 0.46 ug/mL-FEU (ref 0.00–0.50)

## 2016-02-07 LAB — MAGNESIUM: MAGNESIUM: 1.9 mg/dL (ref 1.7–2.4)

## 2016-02-07 LAB — RAPID URINE DRUG SCREEN, HOSP PERFORMED
Amphetamines: NOT DETECTED
BARBITURATES: NOT DETECTED
Benzodiazepines: NOT DETECTED
Cocaine: NOT DETECTED
Opiates: NOT DETECTED
TETRAHYDROCANNABINOL: POSITIVE — AB

## 2016-02-07 MED ORDER — FUROSEMIDE 10 MG/ML IJ SOLN
40.0000 mg | Freq: Once | INTRAMUSCULAR | Status: AC
  Administered 2016-02-07: 40 mg via INTRAVENOUS
  Filled 2016-02-07 (×2): qty 4

## 2016-02-07 MED ORDER — ONDANSETRON HCL 4 MG PO TABS: 4.0000 mg | ORAL_TABLET | Freq: Four times a day (QID) | ORAL | Status: DC | PRN

## 2016-02-07 MED ORDER — HYDRALAZINE HCL 20 MG/ML IJ SOLN
20.0000 mg | Freq: Once | INTRAMUSCULAR | Status: AC
  Administered 2016-02-07: 20 mg via INTRAVENOUS
  Filled 2016-02-07: qty 1

## 2016-02-07 MED ORDER — ASPIRIN 81 MG PO CHEW
324.0000 mg | CHEWABLE_TABLET | Freq: Once | ORAL | Status: AC
  Administered 2016-02-07: 324 mg via ORAL
  Filled 2016-02-07: qty 4

## 2016-02-07 MED ORDER — ONDANSETRON HCL 4 MG/2ML IJ SOLN: 4.0000 mg | Freq: Four times a day (QID) | INTRAMUSCULAR | Status: DC | PRN

## 2016-02-07 MED ORDER — ASPIRIN EC 325 MG PO TBEC
325.0000 mg | DELAYED_RELEASE_TABLET | Freq: Every day | ORAL | Status: DC
  Administered 2016-02-07 – 2016-02-09 (×3): 325 mg via ORAL
  Filled 2016-02-07 (×3): qty 1

## 2016-02-07 MED ORDER — ACETAMINOPHEN 325 MG PO TABS: 650.0000 mg | ORAL_TABLET | Freq: Four times a day (QID) | ORAL | Status: DC | PRN

## 2016-02-07 MED ORDER — ENOXAPARIN SODIUM 40 MG/0.4ML ~~LOC~~ SOLN
40.0000 mg | SUBCUTANEOUS | Status: DC
  Administered 2016-02-07 – 2016-02-09 (×3): 40 mg via SUBCUTANEOUS
  Filled 2016-02-07 (×3): qty 0.4

## 2016-02-07 MED ORDER — ACETAMINOPHEN 650 MG RE SUPP: 650.0000 mg | Freq: Four times a day (QID) | RECTAL | Status: DC | PRN

## 2016-02-07 MED ORDER — POTASSIUM CHLORIDE CRYS ER 20 MEQ PO TBCR: 20.0000 meq | EXTENDED_RELEASE_TABLET | Freq: Every day | ORAL | Status: DC

## 2016-02-07 MED ORDER — SODIUM CHLORIDE 0.9% FLUSH
3.0000 mL | Freq: Two times a day (BID) | INTRAVENOUS | Status: DC
  Administered 2016-02-07 – 2016-02-09 (×5): 3 mL via INTRAVENOUS

## 2016-02-07 MED ORDER — NITROGLYCERIN 2 % TD OINT
1.0000 [in_us] | TOPICAL_OINTMENT | Freq: Four times a day (QID) | TRANSDERMAL | Status: DC
  Administered 2016-02-07 – 2016-02-09 (×10): 1 [in_us] via TOPICAL
  Filled 2016-02-07: qty 30
  Filled 2016-02-07: qty 1

## 2016-02-07 MED ORDER — FUROSEMIDE 10 MG/ML IJ SOLN
40.0000 mg | Freq: Every day | INTRAMUSCULAR | Status: DC
  Administered 2016-02-07 – 2016-02-08 (×2): 40 mg via INTRAVENOUS
  Filled 2016-02-07 (×2): qty 4

## 2016-02-07 MED ORDER — AMLODIPINE BESYLATE 5 MG PO TABS
5.0000 mg | ORAL_TABLET | Freq: Every day | ORAL | Status: DC
  Administered 2016-02-07 – 2016-02-09 (×3): 5 mg via ORAL
  Filled 2016-02-07 (×3): qty 1

## 2016-02-07 MED ORDER — HYDRALAZINE HCL 20 MG/ML IJ SOLN
10.0000 mg | INTRAMUSCULAR | Status: DC | PRN
Start: 2016-02-07 — End: 2016-02-09
  Administered 2016-02-07 – 2016-02-08 (×3): 10 mg via INTRAVENOUS
  Filled 2016-02-07 (×3): qty 1

## 2016-02-07 MED ORDER — LOSARTAN POTASSIUM 50 MG PO TABS
50.0000 mg | ORAL_TABLET | Freq: Every day | ORAL | Status: DC
  Administered 2016-02-08: 50 mg via ORAL
  Filled 2016-02-07: qty 1

## 2016-02-07 MED ORDER — LOSARTAN POTASSIUM 50 MG PO TABS
50.0000 mg | ORAL_TABLET | Freq: Once | ORAL | Status: AC
  Administered 2016-02-07: 50 mg via ORAL
  Filled 2016-02-07: qty 1

## 2016-02-07 MED ORDER — POTASSIUM CHLORIDE CRYS ER 20 MEQ PO TBCR
40.0000 meq | EXTENDED_RELEASE_TABLET | Freq: Four times a day (QID) | ORAL | Status: AC
  Administered 2016-02-07 (×2): 40 meq via ORAL
  Filled 2016-02-07 (×2): qty 2

## 2016-02-07 NOTE — Progress Notes (Signed)
Echocardiogram 2D Echocardiogram has been performed.  Nolon RodBrown, Tony 02/07/2016, 3:21 PM

## 2016-02-07 NOTE — ED Notes (Signed)
Nitroglycerin ointment removed per Dr. Donnald GarrePfeiffer due to patient's blood pressure decreasing.

## 2016-02-07 NOTE — H&P (Addendum)
[facility] History and Physical  MARCUS SCHWANDT ZOX:[id number] DOB: Jan 14, 1981 DOA: 02/06/2016  Referring physician: Dr. Clarice Pole. PCP: Sanda Linger, MD  Specialists: None.  Chief Complaint: Shortness of breath.  HPI: Steve Carrillo is a 35 y.o. male with history of congestive heart failure, hypertension presents to the ER because of sudden onset of shortness of breath. Patient states he was in the cinema theater when he suddenly developed shortness of breath. Denies any chest pain had some cough denies any fever or chills. In the ER patient's blood pressure initially was more than 200 systolic. Patient states he has been taking his Lasix daily with potassium but has not been taking his antihypertensives for last few months since he ran out of it. Patient was admitted in 2015 at [facility] for congestive heart failure and at that time 2-D echo showed EF of 35-40% with diffuse hypokinesis and cardiac cath showed normal coronaries. Patient's blood pressure improved at this time with hydralazine and nitroglycerin patch and one dose of Cozaar. Patient has been admitted for further management of hypertensive urgency and congestive heart failure.   Review of Systems: As presented in the history of presenting illness, rest negative.  Past Medical History  Diagnosis Date  . Hypertension   . 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 Hernia repair    . Cardiac catheterization  10/2014    1. Normal coronary anatomy. Vessels are very large due to hypertensive cardiomyopathy.  . Left heart catheterization with coronary angiogram N/A 10/22/2014    Procedure: LEFT HEART CATHETERIZATION WITH CORONARY ANGIOGRAM;  Surgeon: Peter M Swaziland, MD;  Location: [facility] CATH LAB;  Service: Cardiovascular;  Laterality: N/A;   Social History:  reports that he quit smoking about 16  months ago. He has never used smokeless tobacco. He reports that he drinks alcohol. He reports that he does not use illicit drugs. Where does patient live Home. Can patient participate in ADLs? Yes.  No Known Allergies  Family History:  Family History  Problem Relation Age of Onset  . Diabetes Mellitus II Mother   . Hypertension Mother       Prior to Admission medications   Medication Sig Start Date End Date Taking? Authorizing Provider  furosemide (LASIX) 40 MG tablet Take 1 tablet (40 mg total) by mouth daily. 10/06/15  Yes Lonia Skinner Sofia, PA-C  potassium chloride (KLOR-CON) 20 MEQ packet Take 20 mEq by mouth daily.   Yes Historical Provider, MD  amLODipine (NORVASC) 5 MG tablet Take 1 tablet (5 mg total) by mouth daily. Patient not taking: Reported on 02/06/2016 10/06/15   Elson Areas, PA-C  benzonatate (TESSALON) 200 MG capsule Take 1 capsule (200 mg total) by mouth 3 (three) times daily as needed for cough. Patient not taking: Reported on 02/06/2016 05/06/15   Shirline Frees, NP  HYDROcodone-acetaminophen (NORCO/VICODIN) 5-325 MG tablet Take 2 tablets by mouth every 4 (four) hours as needed. Patient not taking: Reported on 02/06/2016 10/06/15   Elson Areas, PA-C  ibuprofen (ADVIL,MOTRIN) 800 MG tablet Take 1 tablet (800 mg total) by mouth 3 (three) times daily. Patient not taking: Reported on 02/06/2016 10/06/15   Elson Areas, PA-C  isosorbide-hydrALAZINE (BIDIL) 20-37.5 MG per tablet Take 1 tablet by mouth 3 (three) times daily. Patient not taking: Reported on 02/06/2016 11/25/14   Etta Grandchild, MD  losartan (COZAAR) 50 MG tablet Take 1 tablet (50 mg total) by mouth daily. Patient not taking: Reported on 02/06/2016 10/06/15   Elson AreasLeslie K Sofia, PA-C    Physical Exam: Filed Vitals:   02/07/16 0340 02/07/16 0400 02/07/16 0423 02/07/16 0427  BP: 108/67 131/88  151/96  Pulse: 93 90  100  Temp:    98.3 F (36.8 C)  TempSrc:    Oral  Resp: 16 16  16   Height:   6\' 1"  (1.854 m)   Weight:    290 lb 2 oz (131.6 kg)   SpO2: 95% 94%  100%     General:  Moderately built and nourished.  Eyes: Anicteric no pallor.  ENT: No discharge from the ears eyes nose and mouth.  Neck: No mass felt. No JVD appreciated.  Cardiovascular: S1-S2 heard.  Respiratory: No rhonchi or crepitations.  Abdomen: Soft nontender bowel sounds present.  Skin: No rash.  Musculoskeletal: No edema.  Psychiatric: Appears normal.  Neurologic: Alert awake oriented to time place and person. Moves all extremities.  Labs on Admission:  Basic Metabolic Panel:  Recent Labs Lab 02/06/16 2334  NA 139  K 3.2*  CL 104  CO2 25  GLUCOSE 135*  BUN 16  CREATININE 1.41*  CALCIUM 8.6*   Liver Function Tests: No results for input(s): AST, ALT, ALKPHOS, BILITOT, PROT, ALBUMIN in the last 168 hours. No results for input(s): LIPASE, AMYLASE in the last 168 hours. No results for input(s): AMMONIA in the last 168 hours. CBC:  Recent Labs Lab 02/06/16 2334  WBC 6.9  HGB 14.8  HCT 43.9  MCV 84.9  PLT 329   Cardiac Enzymes: No results for input(s): CKTOTAL, CKMB, CKMBINDEX, TROPONINI in the last 168 hours.  BNP (last 3 results)  Recent Labs  02/06/16 2334  BNP 242.9*    ProBNP (last 3 results) No results for input(s): PROBNP in the last 8760 hours.  CBG: No results for input(s): GLUCAP in the last 168 hours.  Radiological Exams on Admission: Dg Chest 2 View  02/07/2016  CLINICAL DATA:  Acute onset of shortness of breath and chest pain. EXAM: CHEST  2 VIEW COMPARISON:  Radiograph 10/19/2014 FINDINGS: Cardiomegaly is unchanged. There is mild pulmonary edema. Minimal fluid in the fissure without large subpulmonic effusion. No confluent airspace disease. No pneumothorax. Osseous structures are intact. IMPRESSION: Stable cardiomegaly.  Mild pulmonary edema.  Suspect mild CHF. Electronically Signed   By: Rubye OaksMelanie  Ehinger M.D.   On: 02/07/2016 00:04    EKG: Independently reviewed. Sinus  tachycardia with biatrial enlargement.  Assessment/Plan Principal Problem:   Acute congestive heart failure (HCC) Active Problems:   CHF (congestive heart failure) (HCC)   Hypertensive urgency, malignant   1. Acute congestive systolic heart failure - patient has been noncompliant with his medications. Patient did receive Lasix 40 mg IV in the ER. Patient also received nitroglycerin patch both of which will be continued with Lasix 40 mg IV daily and Cozaar 50 mg daily next dose will be tomorrow since patient received 1 dose in the ER. I have also added amlodipine 5 mg daily which patient is to take during last discharge. Closely follow intake and output metabolic panel daily weights check 2-D echo check troponin. 2. Hypertensive urgency - patient disagrees on when necessary IV hydralazine along with reintroduction of patient's Cozaar 50 mg daily along with amlodipine 5 mg daily. Patient has been placed on Antabuse and past also. Patient advised about being compliant with medications and diet.  Note that patient has elevated creatinine but GFR is within acceptable limits. Closely follow metabolic panel now that since patient is back on ARB and IV Lasix.  Patient has had a cardiac cath in 2015 which showed normal coronaries.   DVT Prophylaxis Lovenox.  Code Status: Full code.  Family Communication: Discussed with patient.  Disposition Plan: Admit to inpatient.    Amarilis Belflower N. [facility] Pager [phone].  If 7PM-7AM, please contact night-coverage [email or web address] Password [email or web address] 02/07/2016, 5:22 AM

## 2016-02-07 NOTE — Progress Notes (Signed)
[facility] PROGRESS NOTE  KRYSTAL DELDUCA ZOX:[id number] DOB: February 24, 1981 DOA: 02/06/2016 PCP: Sanda Linger, MD   HPI: 35 year old male with a history of HTN and CHF presents for evaluation of cough and SOB, acute onset while sitting in a movie theater yesterday evening. In the ER pt's BP was initially 207/132, improved with hydralazine, losartan, and nitro. Reports not taking home BP meds for the past 4-5 months since he ran out and did not FU with PCP. Has been complaint with lasix and potassium for CHF. Echo in 2015 showed EF of 35-45%. He also had a normal cardiac cath at that time. This hospitalization pt has had negative troponins and EKG. CXR showing stable cardiomegaly, mild pulmonary edema.   Subjective: Pt denies any complaints at present, will continue BP management, further workup/treatment for CHF.  Assessment/Plan: Cough/Shortness of breath: Reportedly asymptomatic until acute onset while at rest yesterday evening, may be related to onset of upper respiratory infection on top of CHF or may be due to the latter in isolation - pt has not taken BP medications for the past 4-5 months, though has been complaint with Lasix and potassium. Denies any SOB at present, though has had a non-productive cough. Oxygen sats 100% on RA. Influenza panel negative - droplet precautions lifted. See below for CHF management.  Congestive heart failure: Asymptomatic until acute episode of cough & shortness of breath yesterday evening. Dx with systolic HF in November of 2015 with EF of 35-40%, likely related to HTN. Acute exacerbation likely due to non compliance with blood pressure meds over past 4-5 months, has not followed up with PCP. Reports compliance with Lasix and KCl. At present, pt denies any complaint, evidence of trace lower extremity edema on exam. CXR shows stable cardiomegaly and mild pulmonary edema. Fluid restriction - will continue to monitor I&O, daily weights. Low sodium diet. Blood  pressure control as indicated below. Continue lasix, give KCl for potassium of 3.3, monitor renal function. Initial troponin, EKG negative. 2D echo pending. Continuous cardiac monitoring.  Hypertensive urgency: Initial BP of 207/132 on admission. Controlled with losartan, hydralazine, and nitroglycerin paste. Will continue to monitor. Renal function at baseline, Cr of 1.40  Hyperglycemia: Blood glucose 142 this AM, pt does not follow up with PCP and may have new onset DM2. Will check Hgb A1C.  Code Status: Full code Family Communication: Family at bedside Disposition Plan: Remain inpatient   Consultants:  Cardiology  Procedures:  None  Antibiotics:  None   Objective: Filed Vitals:   02/07/16 0400 02/07/16 0427  BP: 131/88 151/96  Pulse: 90 100  Temp:  98.3 F (36.8 C)  Resp: 16 16   No intake or output data in the 24 hours ending 02/07/16 0855 Filed Weights   02/06/16 2315 02/07/16 0423  Weight: 117.935 kg (260 lb) 131.6 kg (290 lb 2 oz)    Exam:   General:  Alert and oriented, in no acute distress  Cardiovascular: Tachycardia, no murmur appreciated  Respiratory: CTAB  Abdomen: Soft, NT  Extremities: Trace lower extremity edema Data Reviewed: Basic Metabolic Panel:  Recent Labs Lab 02/06/16 2334 02/07/16 0630  NA 139 138  K 3.2* 3.3*  CL 104 105  CO2 25 25  GLUCOSE 135* 142*  BUN 16 15  CREATININE 1.41* 1.40*  CALCIUM 8.6* 8.6*  MG  --  1.9   Liver Function Tests:  Recent Labs Lab 02/07/16 0630  AST 29  ALT 30  ALKPHOS 79  BILITOT 0.7  PROT 7.1  ALBUMIN 3.8   No results for input(s): LIPASE, AMYLASE in the last 168 hours. No results for input(s): AMMONIA in the last 168 hours. CBC:  Recent Labs Lab 02/06/16 2334 02/07/16 0630  WBC 6.9 6.8  NEUTROABS  --  4.8  HGB 14.8 14.0  HCT 43.9 42.8  MCV 84.9 86.1  PLT 329 324   Cardiac Enzymes:  Recent Labs Lab 02/07/16 0630  TROPONINI 0.03   BNP (last 3 results)  Recent  Labs  02/06/16 2334  BNP 242.9*    ProBNP (last 3 results) No results for input(s): PROBNP in the last 8760 hours.  CBG: No results for input(s): GLUCAP in the last 168 hours.  No results found for this or any previous visit (from the past 240 hour(s)).   Studies: Dg Chest 2 View  02/07/2016  CLINICAL DATA:  Acute onset of shortness of breath and chest pain. EXAM: CHEST  2 VIEW COMPARISON:  Radiograph 10/19/2014 FINDINGS: Cardiomegaly is unchanged. There is mild pulmonary edema. Minimal fluid in the fissure without large subpulmonic effusion. No confluent airspace disease. No pneumothorax. Osseous structures are intact. IMPRESSION: Stable cardiomegaly.  Mild pulmonary edema.  Suspect mild CHF. Electronically Signed   By: Rubye OaksMelanie  Ehinger M.D.   On: 02/07/2016 00:04    Scheduled Meds: . amLODipine  5 mg Oral Daily  . aspirin EC  325 mg Oral Daily  . enoxaparin (LOVENOX) injection  40 mg Subcutaneous Q24H  . furosemide  40 mg Intravenous Daily  . [START ON 02/08/2016] losartan  50 mg Oral Daily  . nitroGLYCERIN  1 inch Topical 4 times per day  . potassium chloride  40 mEq Oral Q6H  . sodium chloride flush  3 mL Intravenous Q12H   Continuous Infusions:   Principal Problem:   Acute congestive heart failure (HCC) Active Problems:   CHF (congestive heart failure) (HCC)   Hypertensive urgency, malignant    Time spent: 25    Maylene RoesKatie Derosa, PA-S [facility] Pager [phone]. If 7PM-7AM, please contact night-coverage at [email or web address], password [email or web address] 02/07/2016, 8:55 AM  LOS: 0 days     Clint LippsMutaz A Ziasia Lenoir Pager: [phone] 02/07/2016, 12:27 PM   Clint LippsMutaz A Alexzandria Massman Pager: [phone] 02/07/2016, 12:53 PM

## 2016-02-07 NOTE — ED Notes (Signed)
MD at bedside. 

## 2016-02-08 ENCOUNTER — Encounter (HOSPITAL_COMMUNITY): Payer: Self-pay | Admitting: Physician Assistant

## 2016-02-08 DIAGNOSIS — I509 Heart failure, unspecified: Secondary | ICD-10-CM

## 2016-02-08 DIAGNOSIS — I5041 Acute combined systolic (congestive) and diastolic (congestive) heart failure: Secondary | ICD-10-CM

## 2016-02-08 DIAGNOSIS — I160 Hypertensive urgency: Secondary | ICD-10-CM

## 2016-02-08 LAB — BASIC METABOLIC PANEL
Anion gap: 6 (ref 5–15)
BUN: 19 mg/dL (ref 6–20)
CALCIUM: 8.6 mg/dL — AB (ref 8.9–10.3)
CO2: 25 mmol/L (ref 22–32)
CREATININE: 1.48 mg/dL — AB (ref 0.61–1.24)
Chloride: 106 mmol/L (ref 101–111)
GFR calc non Af Amer: >60 mL/min (ref >60)
GLUCOSE: 113 mg/dL — AB (ref 65–99)
Potassium: 3.2 mmol/L — ABNORMAL LOW (ref 3.5–5.1)
Sodium: 137 mmol/L (ref 135–145)

## 2016-02-08 MED ORDER — LOSARTAN POTASSIUM 50 MG PO TABS
100.0000 mg | ORAL_TABLET | Freq: Every day | ORAL | Status: DC
  Administered 2016-02-09: 100 mg via ORAL
  Filled 2016-02-08: qty 2

## 2016-02-08 MED ORDER — CARVEDILOL 6.25 MG PO TABS
6.2500 mg | ORAL_TABLET | Freq: Two times a day (BID) | ORAL | Status: DC
  Administered 2016-02-08 – 2016-02-09 (×3): 6.25 mg via ORAL
  Filled 2016-02-08 (×3): qty 1

## 2016-02-08 MED ORDER — HYDRALAZINE HCL 20 MG/ML IJ SOLN
10.0000 mg | Freq: Once | INTRAMUSCULAR | Status: AC
  Administered 2016-02-08: 10 mg via INTRAVENOUS
  Filled 2016-02-08: qty 1

## 2016-02-08 MED ORDER — POTASSIUM CHLORIDE CRYS ER 20 MEQ PO TBCR
60.0000 meq | EXTENDED_RELEASE_TABLET | Freq: Four times a day (QID) | ORAL | Status: AC
  Administered 2016-02-08 (×2): 60 meq via ORAL
  Filled 2016-02-08 (×2): qty 3

## 2016-02-08 NOTE — Consult Note (Signed)
CARDIOLOGY CONSULT NOTE   Patient ID: Steve Carrillo MRN: [id number] DOB/AGE: Apr 11, 1981 35 y.o.  Admit date: 02/06/2016  Primary Physician   Sanda Linger, MD Primary Cardiologist   Dr Royann Shivers (saw in-hosp 2015) Reason for Consultation   CHF  Steve Carrillo is a 35 y.o. year old male with a history of HTN, med non-compliance, hypertensive heart disease with EF 35-40% 2015, nl cors at cath, NICM, S-CHF, HLD. No cards f/u after 2015 admission.  Steve Carrillo had been doing fairly well. He admits that he did not follow a low-sodium diet. He is not weighing himself daily. He was taking his Lasix and potassium, but no other medications. He had not been having lower extremity edema and denies orthopnea or PND.  On the day of admission, he had taken his Lasix and potassium and was out at the movies. While he was at the movies, he developed shortness of breath. Shortness of breath worsened and his girlfriend went to get the car. While he was standing and waiting for the car, he got lightheaded. He did not have any palpitations. He was very short of breath on admission. Since being admitted, his symptoms have dramatically improved. He now feels pretty normal.  The only chest pain he has is an occasional fleeting pain. It is not exertional. It only lasts a couple of seconds. It is sharp. It sort of goes across his chest. No history of exertional chest pain.    Past Medical History  Diagnosis Date  . Hypertension   . Cardiomyopathy due to hypertension (HCC) 10/2014    NICM EF 35-40%, global LV strain is abnormal at -10.9%  . Hyperlipidemia LDL goal <130 2015     Past Surgical History  Procedure Laterality Date  . Hernia repair    . Left heart catheterization with coronary angiogram N/A 10/22/2014    Procedure: LEFT HEART CATHETERIZATION WITH CORONARY ANGIOGRAM;  Surgeon: Cindia Hustead M [place], MD; Normal coronary anatomy. Vessels are very large due to hypertensive cardiomyopathy.     No  Known Allergies  I have reviewed the patient's current medications . amLODipine  5 mg Oral Daily  . aspirin EC  325 mg Oral Daily  . enoxaparin (LOVENOX) injection  40 mg Subcutaneous Q24H  . furosemide  40 mg Intravenous Daily  . losartan  50 mg Oral Daily  . nitroGLYCERIN  1 inch Topical 4 times per day  . potassium chloride  60 mEq Oral Q6H  . sodium chloride flush  3 mL Intravenous Q12H     acetaminophen **OR** acetaminophen, hydrALAZINE, ondansetron **OR** ondansetron (ZOFRAN) IV  Medication Sig  furosemide (LASIX) 40 MG tablet Take 1 tablet (40 mg total) by mouth daily.  potassium chloride (KLOR-CON) 20 MEQ packet Take 20 mEq by mouth daily.     Social History   Social History  . Marital Status: Single    Spouse Name: N/A  . Number of Children: N/A  . Years of Education: N/A   Occupational History  . Jan[employer]   Social History Main Topics  . Smoking status: Former Smoker    Quit date: 09/14/2014  . Smokeless tobacco: Never Used  . Alcohol Use: Yes     Comment: Twice a month  . Drug Use: No  . Sexual Activity: Not on file   Other Topics Concern  . Not on file   Social History Narrative   Lives with girlfriend.     Family Status  Relation Status  Death Age  . Mother Alive   . Father Alive    Family History  Problem Relation Age of Onset  . Diabetes Mellitus II Mother   . Hypertension Mother      ROS:  Full 14 point review of systems complete and found to be negative unless listed above.  Physical Exam: Blood pressure 163/114, pulse 99, temperature 97.8 F (36.6 C), temperature source Oral, resp. rate 18, height 6\' 1"  (1.854 m), weight 305 lb 4.8 oz (138.483 kg), SpO2 99 %.  General: Well developed, well nourished, male in no acute distress Head: Eyes PERRLA, No xanthomas.   Normocephalic and atraumatic, oropharynx without edema or exudate. Dentition: Good Lungs: Decreased breath sounds bases with a few rales Heart: HRRR S1 S2, no  rub/gallop, no murmur. pulses are 2+ all 4 extrem.   Neck: No carotid bruits. No lymphadenopathy.  JVD not elevated but difficult to assess secondary to body habitus. Abdomen: Bowel sounds present, abdomen soft and non-tender without masses or hernias noted. Msk:  No spine or cva tenderness. No weakness, no joint deformities or effusions. Extremities: No clubbing or cyanosis. No edema.  Neuro: Alert and oriented X 3. No focal deficits noted. Psych:  Good affect, responds appropriately Skin: No rashes or lesions noted.  Labs:   Lab Results  Component Value Date   WBC 6.8 02/07/2016   HGB 14.0 02/07/2016   HCT 42.8 02/07/2016   MCV 86.1 02/07/2016   PLT 324 02/07/2016     Recent Labs Lab 02/07/16 0630 02/08/16 0420  NA 138 137  K 3.3* 3.2*  CL 105 106  CO2 25 25  BUN 15 19  CREATININE 1.40* 1.48*  CALCIUM 8.6* 8.6*  PROT 7.1  --   BILITOT 0.7  --   ALKPHOS 79  --   ALT 30  --   AST 29  --   GLUCOSE 142* 113*  ALBUMIN 3.8  --    MAGNESIUM  Date Value Ref Range Status  02/07/2016 1.9 1.7 - 2.4 mg/dL Final    Recent Labs  91/47/8202/05/20 0630 02/07/16 1158 02/07/16 1754  TROPONINI 0.03 0.03 0.03    Recent Labs  02/06/16 2338  TROPIPOC 0.04   B NATRIURETIC PEPTIDE  Date/Time Value Ref Range Status  02/06/2016 11:34 PM 242.9* 0.0 - 100.0 pg/mL Final   Lab Results  Component Value Date   DDIMER 0.46 02/07/2016   TSH  Date/Time Value Ref Range Status  02/07/2016 06:30 AM 0.567 0.350 - 4.500 uIU/mL Final  11/05/2014 02:03 PM 1.59 0.35 - 4.50 uIU/mL Final    Echo: 02/07/2016 - Left ventricle: The cavity size was severely dilated. Wall thickness was increased in a pattern of moderate LVH. Systolic function was moderately reduced. The estimated ejection fraction was in the range of 35% to 40%. Features are consistent with a pseudonormal left ventricular filling pattern, with concomitant abnormal relaxation and increased filling pressure (grade  2 diastolic dysfunction). 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 - Left atrium: The atrium was moderately dilated. Impressions: - Poor acoustic windows limit study.  ECG:  02/06/2016 Sinus tachycardia, rate 114 Biatrial enlargement and LVH  Radiology:  Dg Chest 2 View  02/07/2016  CLINICAL DATA:  Acute onset of shortness of breath and chest pain. EXAM: CHEST  2 VIEW COMPARISON:  Radiograph 10/19/2014 FINDINGS: Cardiomegaly is unchanged. There is mild pulmonary edema. Minimal fluid in the fissure without large subpulmonic effusion. No confluent airspace  disease. No pneumothorax. Osseous structures are intact. IMPRESSION: Stable cardiomegaly.  Mild pulmonary edema.  Suspect mild CHF. Electronically Signed   By: Rubye Oaks M.D.   On: 02/07/2016 00:04    ASSESSMENT AND PLAN:   The patient was seen today by Dr Eden Emms, the patient evaluated and the data reviewed.  Principal Problem: 1.  Acute combined systolic and diastolic congestive heart failure, NYHA class 3 (HCC) - volume status improved.  - Weights appear higher, unclear why, ?different scales - I/O are incomplete as almost no UOP recorded. - needs scales, will contact Daphne - compliance w/ diet and meds emphasized, girlfriend told him she was willing to help. - EF is unchanged from 2015 and chest pain very atypical, doubt ischemic eval needed.   2.  CKD - no change with diuresis - continue to follow daily - likely 2nd uncontrolled HTN  3.  Hypokalemia - supplement ordered  Otherwise, per IM Active Problems:   CHF (congestive heart failure) (HCC) - see above     Hypertensive urgency, malignant - BP still not controlled - MD advise on increasing amlodipine - doubt he will be compliant with a multi-drug regimen  Signed: Leanna Battles 02/08/2016 9:44 AM Beeper [phone]  Co-Sign MD  Patient examined chart reviewed. Primary issue is non compliance ,  obesity and poorly controlled BP Improved on exam with clear lungs.  Discussed with patient long term risk of worsening DCM and CRF If he does not comply with meds.  Increase cozaar to 100 mg  Should be ok of d/c in am Will arrange outpatient f/u with Dr Theresia Lo

## 2016-02-08 NOTE — Progress Notes (Signed)
[facility] PROGRESS NOTE  Job FoundsScottie J Fedder ZOX:[id number] DOB: 01-21-1981 DOA: 02/06/2016 PCP: Sanda Lingerhomas Jones, MD   HPI: 35 year old male with a history of HTN and CHF presents for evaluation of cough and SOB, acute onset while sitting in a movie theater yesterday evening. In the ER pt's BP was initially 207/132, improved with hydralazine, losartan, and nitro. Reports not taking home BP meds for the past 4-5 months since he ran out and did not FU with PCP. Has been complaint with lasix and potassium for CHF. Echo in 2015 showed EF of 35-45%. He also had a normal cardiac cath at that time. This hospitalization pt has had negative troponins and EKG. CXR showing stable cardiomegaly, mild pulmonary edema.   Subjective: No new complaints, denies any shortness of breath or chest pain, no orthopnea.  Assessment/Plan:  Acute combined systolic/diastolic CHF Dx with systolic HF in November of 2015 with EF of 35-40%, likely related to HTN. Clean cath in 2015. Acute exacerbation likely due to non compliance with blood pressure meds over past 4-5 months, has not followed up with PCP. Reports compliance with Lasix and KCl.  CXR shows stable cardiomegaly and mild pulmonary edema.  Continue IV diuresis, monitor intake/output and daily weight. Cardiology to evaluate.  Hypertensive urgency:  Initial BP of 207/132 on admission. Controlled with losartan and hydralazine. Discontinued nitro paste. Blood pressure improved, but still elevated at 163/114, he will need beta blocker as well. We'll start Coreg.  Cough/Shortness of breath:  Likely secondary to the acute CHF exacerbation, negative flu panel. Reported subjective fever but no productive cough, follow clinically.  Hyperglycemia:  Blood glucose 142 this AM, check Hgb A1C.  Code Status: Full code Family Communication: Family at bedside Disposition Plan: Remain  inpatient   Consultants:  Cardiology  Procedures:  None  Antibiotics:  None   Objective: Filed Vitals:   02/08/16 0041 02/08/16 0516  BP: 159/75 163/114  Pulse: 99 99  Temp:  97.8 F (36.6 C)  Resp:  18    Intake/Output Summary (Last 24 hours) at 02/08/16 1123 Last data filed at 02/08/16 0656  Gross per 24 hour  Intake   1200 ml  Output      0 ml  Net   1200 ml   Filed Weights   02/06/16 2315 02/07/16 0423 02/08/16 0459  Weight: 117.935 kg (260 lb) 131.6 kg (290 lb 2 oz) 138.483 kg (305 lb 4.8 oz)    Exam:   General:  Alert and oriented, in no acute distress  Cardiovascular: Tachycardia, no murmur appreciated  Respiratory: CTAB  Abdomen: Soft, NT  Extremities: Trace lower extremity edema   Data Reviewed: Basic Metabolic Panel:  Recent Labs Lab 02/06/16 2334 02/07/16 0630 02/08/16 0420  NA 139 138 137  K 3.2* 3.3* 3.2*  CL 104 105 106  CO2 25 25 25   GLUCOSE 135* 142* 113*  BUN 16 15 19   CREATININE 1.41* 1.40* 1.48*  CALCIUM 8.6* 8.6* 8.6*  MG  --  1.9  --    Liver Function Tests:  Recent Labs Lab 02/07/16 0630  AST 29  ALT 30  ALKPHOS 79  BILITOT 0.7  PROT 7.1  ALBUMIN 3.8   No results for input(s): LIPASE, AMYLASE in the last 168 hours. No results for input(s): AMMONIA in the last 168 hours. CBC:  Recent Labs Lab 02/06/16 2334 02/07/16 0630  WBC 6.9 6.8  NEUTROABS  --  4.8  HGB 14.8 14.0  HCT 43.9 42.8  MCV 84.9 86.1  PLT 329 324   Cardiac Enzymes:  Recent Labs Lab 02/07/16 0630 02/07/16 1158 02/07/16 1754  TROPONINI 0.03 0.03 0.03   BNP (last 3 results)  Recent Labs  02/06/16 2334  BNP 242.9*    ProBNP (last 3 results) No results for input(s): PROBNP in the last 8760 hours.  CBG: No results for input(s): GLUCAP in the last 168 hours.  No results found for this or any previous visit (from the past 240 hour(s)).   Studies: Dg Chest 2 View  02/07/2016  CLINICAL DATA:  Acute onset of shortness of  breath and chest pain. EXAM: CHEST  2 VIEW COMPARISON:  Radiograph 10/19/2014 FINDINGS: Cardiomegaly is unchanged. There is mild pulmonary edema. Minimal fluid in the fissure without large subpulmonic effusion. No confluent airspace disease. No pneumothorax. Osseous structures are intact. IMPRESSION: Stable cardiomegaly.  Mild pulmonary edema.  Suspect mild CHF. Electronically Signed   By: Rubye Oaks M.D.   On: 02/07/2016 00:04    Scheduled Meds: . amLODipine  5 mg Oral Daily  . aspirin EC  325 mg Oral Daily  . enoxaparin (LOVENOX) injection  40 mg Subcutaneous Q24H  . furosemide  40 mg Intravenous Daily  . losartan  50 mg Oral Daily  . nitroGLYCERIN  1 inch Topical 4 times per day  . potassium chloride  60 mEq Oral Q6H  . sodium chloride flush  3 mL Intravenous Q12H   Continuous Infusions:   Principal Problem:   Acute combined systolic and diastolic congestive heart failure, NYHA class 3 (HCC) Active Problems:   CHF (congestive heart failure) (HCC)   Hypertensive urgency, malignant    Time spent: 25 minutes    [facility] Pager [phone]. If 7PM-7AM, please contact night-coverage at [email or web address], password [facility] 02/08/2016, 11:23 AM  LOS: 1 day

## 2016-02-09 DIAGNOSIS — R7302 Impaired glucose tolerance (oral): Secondary | ICD-10-CM

## 2016-02-09 LAB — BASIC METABOLIC PANEL
ANION GAP: 10 (ref 5–15)
BUN: 18 mg/dL (ref 6–20)
CALCIUM: 9.2 mg/dL (ref 8.9–10.3)
CO2: 24 mmol/L (ref 22–32)
CREATININE: 1.44 mg/dL — AB (ref 0.61–1.24)
Chloride: 107 mmol/L (ref 101–111)
GFR calc Af Amer: >60 mL/min (ref >60)
GLUCOSE: 130 mg/dL — AB (ref 65–99)
Potassium: 3.3 mmol/L — ABNORMAL LOW (ref 3.5–5.1)
Sodium: 141 mmol/L (ref 135–145)

## 2016-02-09 LAB — HEMOGLOBIN A1C
HEMOGLOBIN A1C: 6 % — AB (ref 4.8–5.6)
MEAN PLASMA GLUCOSE: 126 mg/dL

## 2016-02-09 LAB — MAGNESIUM: Magnesium: 2.1 mg/dL (ref 1.7–2.4)

## 2016-02-09 MED ORDER — POTASSIUM CHLORIDE ER 10 MEQ PO TBCR: 20.0000 meq | EXTENDED_RELEASE_TABLET | Freq: Every day | ORAL | Status: DC

## 2016-02-09 MED ORDER — FUROSEMIDE 40 MG PO TABS: 40.0000 mg | ORAL_TABLET | Freq: Every day | ORAL | Status: DC

## 2016-02-09 MED ORDER — LOSARTAN POTASSIUM 50 MG PO TABS: 50.0000 mg | ORAL_TABLET | Freq: Every day | ORAL | Status: DC

## 2016-02-09 MED ORDER — CARVEDILOL 12.5 MG PO TABS: 12.5000 mg | ORAL_TABLET | Freq: Two times a day (BID) | ORAL | Status: DC

## 2016-02-09 MED ORDER — POTASSIUM CHLORIDE CRYS ER 20 MEQ PO TBCR
60.0000 meq | EXTENDED_RELEASE_TABLET | Freq: Once | ORAL | Status: AC
  Administered 2016-02-09: 60 meq via ORAL
  Filled 2016-02-09: qty 3

## 2016-02-09 MED ORDER — FUROSEMIDE 40 MG PO TABS
40.0000 mg | ORAL_TABLET | Freq: Every day | ORAL | Status: DC
  Administered 2016-02-09: 40 mg via ORAL
  Filled 2016-02-09 (×2): qty 1

## 2016-02-09 MED ORDER — AMLODIPINE BESYLATE 5 MG PO TABS: 5.0000 mg | ORAL_TABLET | Freq: Every day | ORAL | Status: DC

## 2016-02-09 NOTE — Progress Notes (Signed)
Pt discharged to home. Pt given discharge instructions and answered all questions appropriately. He understands the risks and benefits on staying on his medication regimen and following it closely. Prescriptions sent electronically to [facility].

## 2016-02-09 NOTE — Discharge Summary (Signed)
Physician Discharge Summary  Steve Carrillo ZOX:[id number] DOB: [birth year] DOA: 02/06/2016  PCP: Sanda Linger, MD  Admit date: 02/06/2016 Discharge date: 02/20/2016  Time spent:50  minutes   Discharge Condition: stable    Discharge Diagnoses:  Principal Problem:   Acute combined systolic and diastolic congestive heart failure, NYHA class 3 (HCC) Active Problems:   CHF (congestive heart failure) (HCC)   Hypertensive urgency, malignant   Glucose intolerance (impaired glucose tolerance)   History of present illness:  35 year old male with a history of HTN and CHF presents for evaluation of cough and SOB, acute onset while sitting in a movie theater yesterday evening. In the ER pt's BP was initially 207/132, improved with hydralazine, losartan, and nitro. Reports not taking home BP meds for the past 4-5 months since he ran out and did not FU with PCP. Has been complaint with lasix and potassium for CHF. Echo in 2015 showed EF of 35-45%. He also had a normal cardiac cath at that time. This hospitalization pt has had negative troponins and EKG. CXR showing stable cardiomegaly, mild pulmonary edema.    Hospital Course:  Acute combined systolic/diastolic CHF Dx with systolic HF in November of 2015 with EF of 35-40%, likely related to HTN. Clean cath in 2015. Acute exacerbation likely due to non compliance with blood pressure meds over past 4-5 months, has not followed up with PCP. Reports compliance with Lasix and KCl.  CXR shows stable cardiomegaly and mild pulmonary edema.  Discussed compliance with meds, diet, daily weights - see ECHO below- cardiology has evaluated him and will arrange outpt f/u with Dr Royann Shivers  Hypertensive urgency:  Initial BP of 207/132 on admission.   Improved with Coreg, Norvasc and Losartan  Cough/Shortness of breath:  Likely secondary to the acute CHF exacerbation, negative flu panel. Reported subjective fever but no productive cough   Glucose  intolerance: - Hgb A1C 6.0  Procedures: ECHO Study Conclusions  - Left ventricle: The cavity size was severely dilated. Wall  thickness was increased in a pattern of moderate LVH. Systolic  function was moderately reduced. The estimated ejection fraction  was in the range of 35% to 40%. Features are consistent with a  pseudonormal left ventricular filling pattern, with concomitant  abnormal relaxation and increased filling pressure (grade 2  diastolic dysfunction). 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 Proximal ascending aorta  is dilated at 43 mm. - Left atrium: The atrium was moderately dilated.  Impressions:  - Poor acoustic windows limit study.  Consultations:  Cardiology  Discharge Exam: Filed Weights   02/07/16 0423 02/08/16 0459 02/09/16 0539  Weight: 131.6 kg (290 lb 2 oz) 138.483 kg (305 lb 4.8 oz) 138.6 kg (305 lb 8.9 oz)   Filed Vitals:   02/09/16 0916 02/09/16 1353  BP: 143/97 153/108  Pulse: 96 99  Temp:  97.8 F (36.6 C)  Resp:  20    General: AAO x 3, no distress Cardiovascular: RRR, no murmurs  Respiratory: clear to auscultation bilaterally GI: soft, non-tender, non-distended, bowel sound positive  Discharge Instructions You were cared for by a hospitalist during your hospital stay. If you have any questions about your discharge medications or the care you received while you were in the hospital after you are discharged, you can call the unit and asked to speak with the hospitalist on call if the hospitalist that took care of you is not available. Once you are discharged, your primary care physician will handle any further medical issues.  Please note that NO REFILLS for any discharge medications will be authorized once you are discharged, as it is imperative that you return to your primary care physician (or establish a relationship with a primary care physician if you do not have one) for your aftercare needs so that they can reassess your  need for medications and monitor your lab values.      Discharge Instructions    Discharge instructions    Complete by:  As directed   Low salt, low fat, low     Increase activity slowly    Complete by:  As directed             Medication List    STOP taking these medications        potassium chloride 20 MEQ packet  Commonly known as:  KLOR-CON  Replaced by:  potassium chloride 10 MEQ tablet      TAKE these medications        amLODipine 5 MG tablet  Commonly known as:  NORVASC  Take 1 tablet (5 mg total) by mouth daily.     carvedilol 12.5 MG tablet  Commonly known as:  COREG  Take 1 tablet (12.5 mg total) by mouth 2 (two) times daily with a meal.     furosemide 40 MG tablet  Commonly known as:  LASIX  Take 1 tablet (40 mg total) by mouth daily.     losartan 50 MG tablet  Commonly known as:  COZAAR  Take 1 tablet (50 mg total) by mouth daily.     potassium chloride 10 MEQ tablet  Commonly known as:  K-DUR  Take 2 tablets (20 mEq total) by mouth daily.       No Known Allergies Follow-up Information    Follow up with HAGER, BRYAN, PA-C On 02/16/2016.   Specialties:  Physician Assistant, Radiology, Interventional Cardiology   Why:  See Cardiology provider at 8:30 am, please arrive 15 minutes early for paperwork. Call and reschedule if cannot come that day. NEEDS BMET.    Contact information:   [address] [place] [address] [phone]       Follow up with Sanda Linger, MD In 1 week.   Specialty:  Internal Medicine   Contact information:   [address] [place] [address] [phone]        The results of significant diagnostics from this hospitalization (including imaging, microbiology, ancillary and laboratory) are listed below for reference.    Significant Diagnostic Studies: Dg Chest 2 View  02/07/2016  CLINICAL DATA:  Acute onset of shortness of breath and chest pain. EXAM: CHEST  2 VIEW COMPARISON:  Radiograph  10/19/2014 FINDINGS: Cardiomegaly is unchanged. There is mild pulmonary edema. Minimal fluid in the fissure without large subpulmonic effusion. No confluent airspace disease. No pneumothorax. Osseous structures are intact. IMPRESSION: Stable cardiomegaly.  Mild pulmonary edema.  Suspect mild CHF. Electronically Signed   By: Rubye Oaks M.D.   On: 02/07/2016 00:04    Microbiology: No results found for this or any previous visit (from the past 240 hour(s)).   Labs: Basic Metabolic Panel: No results for input(s): NA, K, CL, CO2, GLUCOSE, BUN, CREATININE, CALCIUM, MG, PHOS in the last 168 hours. Liver Function Tests: No results for input(s): AST, ALT, ALKPHOS, BILITOT, PROT, ALBUMIN in the last 168 hours. No results for input(s): LIPASE, AMYLASE in the last 168 hours. No results for input(s): AMMONIA in the last 168 hours. CBC: No  results for input(s): WBC, NEUTROABS, HGB, HCT, MCV, PLT in the last 168 hours. Cardiac Enzymes: No results for input(s): CKTOTAL, CKMB, CKMBINDEX, TROPONINI in the last 168 hours. BNP: BNP (last 3 results)  Recent Labs  02/06/16 2334  BNP 242.9*    ProBNP (last 3 results) No results for input(s): PROBNP in the last 8760 hours.  CBG: No results for input(s): GLUCAP in the last 168 hours.     SignedCalvert Cantor:  Steve Rebuck, MD [facility] 02/20/2016, 9:02 AM

## 2016-02-09 NOTE — Progress Notes (Signed)
Pt states that he is able to purchase his medications and he has a PCP.

## 2016-02-16 ENCOUNTER — Encounter: Payer: BC Managed Care – PPO | Admitting: Physician Assistant

## 2016-02-20 DIAGNOSIS — R7303 Prediabetes: Secondary | ICD-10-CM

## 2016-02-22 ENCOUNTER — Encounter: Payer: BC Managed Care – PPO | Admitting: Physician Assistant

## 2016-02-25 ENCOUNTER — Telehealth: Payer: Self-pay | Admitting: *Deleted

## 2016-02-25 ENCOUNTER — Ambulatory Visit (INDEPENDENT_AMBULATORY_CARE_PROVIDER_SITE_OTHER): Payer: BC Managed Care – PPO | Admitting: Physician Assistant

## 2016-02-25 ENCOUNTER — Encounter: Payer: Self-pay | Admitting: Physician Assistant

## 2016-02-25 VITALS — BP 140/100 | HR 80 | Ht 73.0 in | Wt 307.0 lb

## 2016-02-25 DIAGNOSIS — I5021 Acute systolic (congestive) heart failure: Secondary | ICD-10-CM

## 2016-02-25 DIAGNOSIS — R0602 Shortness of breath: Secondary | ICD-10-CM

## 2016-02-25 DIAGNOSIS — I160 Hypertensive urgency: Secondary | ICD-10-CM | POA: Diagnosis not present

## 2016-02-25 MED ORDER — CARVEDILOL 25 MG PO TABS: 25.0000 mg | ORAL_TABLET | Freq: Two times a day (BID) | ORAL | Status: DC

## 2016-02-25 NOTE — Patient Instructions (Addendum)
Medication Instructions:  Your physician has recommended you make the following change in your medication:  1.  INCREASE the Coreg (Carvedilol) to 25 mg taking 1 tablet twice a day (2 of the 12.5 twice a day is ok until you use them)   Labwork: None ordered  Testing/Procedures: None ordered  Follow-Up: Your physician recommends that you schedule a follow-up appointment in: 3 MONTHS WITH DR. Royann ShiversROITORU    Any Other Special Instructions Will Be Listed Below (If Applicable).     If you need a refill on your cardiac medications before your next appointment, please call your pharmacy.

## 2016-02-25 NOTE — Progress Notes (Signed)
Cardiology Office Note   Date:  02/25/2016   ID:  Steve Carrillo, DOB 12/21/1980, MRN [id number]  PCP:  Sanda Linger, MD  Cardiologist:  Dr. Royann Shivers  Chief Complaint  Patient presents with  . post hospitalization    seen for Dr. Royann Shivers      History of Present Illness: Steve Carrillo is a 35 y.o. male who presents for post hospital cardiology follow-up, the patient was admitted in the hospital from 3/5-02/09/2016. He he has a past medical history of hypertension, medication noncompliance, hypertension, known ischemic cardiomyopathy with EF 35-40% 2015, normal coronaries on cath, chronic systolic heart failure, and hyperlipidemia. He was previously admitted in 2015, however failed to follow-up with cardiology afterward. He presented to [facility] on 02/06/2016 after presented with chest discomfort and shortness of breath. Cardiology was consulted, repeat echocardiogram on 3/6 shows EF still at 35-40%, grade 2 diastolic dysfunction, dilated aortic root at 4 cm, dilated proximal ascending aorta at 4.3 cm. Hemoglobin A1c 6.0 consistent with prediabetes. Cozaar was added to his medical regimen. He was eventually discharged on 01/12/2016.   He presents today for cardiology follow-up. Patient states he has been doing very well since discharge. He denies any shortness of breath. There is no lower extremity edema. His blood pressure still elevated with systolic blood pressure 140. Given the dilated aortic root, LVH noted on EKG, we need to be more aggressively controlling his blood pressure. He understands the long-term side effects of uncontrolled hypertension. Hopefully his renal function will improve to some degree as well, his elevated creatinine may be related to uncontrolled hypertension as well. Given the fact that he is on Lasix, we will check a basic metabolic panel today. I will also increase his carvedilol from 12.5 mg twice a day to 25 mg twice a day. I will set him up to see Dr.  Royann Shivers in 3 months.    Past Medical History  Diagnosis Date  . Hypertension   . Cardiomyopathy due to hypertension (HCC) 10/2014    NICM EF 35-40%, global LV strain is abnormal at -10.9%  . Hyperlipidemia LDL goal <130 2015  . Prediabetes   . Dilated aortic root (HCC)     seen on previous echo 02/2016    Past Surgical History  Procedure Laterality Date  . Hernia repair    . Left heart catheterization with coronary angiogram N/A 10/22/2014    Procedure: LEFT HEART CATHETERIZATION WITH CORONARY ANGIOGRAM;  Surgeon: Peter M Swaziland, MD; Normal coronary anatomy. Vessels are very large due to hypertensive cardiomyopathy.      Current Outpatient Prescriptions  Medication Sig Dispense Refill  . amLODipine (NORVASC) 5 MG tablet Take 1 tablet (5 mg total) by mouth daily. 90 tablet 0  . furosemide (LASIX) 40 MG tablet Take 1 tablet (40 mg total) by mouth daily. 90 tablet 0  . losartan (COZAAR) 50 MG tablet Take 1 tablet (50 mg total) by mouth daily. 90 tablet 0  . potassium chloride (K-DUR) 10 MEQ tablet Take 2 tablets (20 mEq total) by mouth daily. 90 tablet 0  . carvedilol (COREG) 25 MG tablet Take 1 tablet (25 mg total) by mouth 2 (two) times daily. 180 tablet 3   No current facility-administered medications for this visit.    Allergies:   Review of patient's allergies indicates no known allergies.    Social History:  The patient  reports that he quit smoking about 17 months ago. He has never used smokeless tobacco. He  reports that he drinks alcohol. He reports that he does not use illicit drugs.   Family History:  The patient's family history includes Diabetes Mellitus II in his mother; Hypertension in his mother.    ROS:  Please see the history of present illness.   Otherwise, review of systems are positive for None.   All other systems are reviewed and negative.    PHYSICAL EXAM: VS:  BP 140/100 mmHg  Pulse 80  Ht 6\' 1"  (1.854 m)  Wt 307 lb (139.254 kg)  BMI 40.51 kg/m2  , BMI Body mass index is 40.51 kg/(m^2). GEN: Well nourished, well developed, in no acute distress HEENT: normal Neck: no JVD, carotid bruits, or masses Cardiac: RRR; no murmurs, rubs, or gallops,no edema  Respiratory:  clear to auscultation bilaterally, normal work of breathing GI: soft, nontender, nondistended, + BS MS: no deformity or atrophy Skin: warm and dry, no rash Neuro:  Strength and sensation are intact Psych: euthymic mood, full affect   EKG:  EKG is ordered today. The ekg ordered today demonstrates NSR without significant ST-T wave changes   Recent Labs: 02/06/2016: B Natriuretic Peptide 242.9* 02/07/2016: ALT 30; Hemoglobin 14.0; Platelets 324; TSH 0.567 02/09/2016: BUN 18; Creatinine, Ser 1.44*; Magnesium 2.1; Potassium 3.3*; Sodium 141    Lipid Panel    Component Value Date/Time   CHOL 221* 11/05/2014 1403   TRIG 118.0 11/05/2014 1403   HDL 41.20 11/05/2014 1403   CHOLHDL 5 11/05/2014 1403   VLDL 23.6 11/05/2014 1403   LDLCALC 156* 11/05/2014 1403      Wt Readings from Last 3 Encounters:  02/25/16 307 lb (139.254 kg)  02/09/16 305 lb 8.9 oz (138.6 kg)  05/06/15 302 lb 12.8 oz (137.349 kg)      Other studies Reviewed: Additional studies/ records that were reviewed today include:   Echo 02/07/2016 LV EF: 35% - 40%  ------------------------------------------------------------------- Indications: Dyspnea 786.09.  ------------------------------------------------------------------- History: PMH: Congestive heart failure. Risk factors: Former tobacco use. Hypertension. Obese.  ------------------------------------------------------------------- Study Conclusions  - Left ventricle: The cavity size was severely dilated. Wall  thickness was increased in a pattern of moderate LVH. Systolic  function was moderately reduced. The estimated ejection fraction  was in the range of 35% to 40%. Features are consistent with a  pseudonormal left  ventricular filling pattern, with concomitant  abnormal relaxation and increased filling pressure (grade 2  diastolic dysfunction). 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 Proximal ascending aorta  is dilated at 43 mm. - Left atrium: The atrium was moderately dilated.  Impressions:  - Poor acoustic windows limit study.   Review of the above records demonstrates:   Patient recently admitted with malignant hypertension and shortness of breath.   ASSESSMENT AND PLAN:  1.  Hypertension: Admitted to the hospital with shortness breath and hypertensive urgency, was discharged on Lasix and multiple blood pressure medication. We'll check a basic metabolic panel today to make sure kidney function is okay and tolerating Lasix. His blood pressure still high 140/100 today, will increase carvedilol to 25 mg twice a day. Continue losartan 50 mg daily, continue amlodipine 5 mg daily. If BP still high on follow-up, would recommend increase amlodipine to 100 mg daily.  2. 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 Likely related to uncontrolled hypertension and the medication noncompliance. Discussed with the patient that he needs to be more  compliant with his medication.  - Also with discussed with the patient that aggressive blood pressure control.  - No obvious sign of acute heart failure on exam today. He is back on his home dose of lasix which he has been taking since 2016.   3. Dilated aortic root: aortic root 4.0 cm, proximal ascending aorta 4.3 cm, advised patient to follow-up with his PCP, may need yearly CT  4. HLD: He is not on any cholesterol medication, will need to recheck his lipid panel on next visit, if still high, he will need statin medication. Previous lipid panel on December 2015 showed LDL 156, cholesterol 221.  5. Prediabetes: Hemoglobin A1c 6.0,  discussed with the patient, diet and exercise for now  6. CKD: Likely related to chronic uncontrolled hypertension  Current medicines are reviewed at length with the patient today.  The patient does not have concerns regarding medicines.  The following changes have been made:  no change  Labs/ tests ordered today include:   Orders Placed This Encounter  Procedures  . Lipid panel  . EKG 12-Lead     Disposition:   FU with Dr. Royann Shivers in 3 months  Signed, Azalee Course, [place]  02/25/2016 7:49 PM    [facility] [address] [place], [place], [place]  [address] Phone: [phone]; Fax: [phone]

## 2016-02-25 NOTE — Telephone Encounter (Signed)
-----   Message from Hao MenAredaleg, [place] sent at 02/25/2016  9:32 AM EDT ----- Regarding: lipid panel He will need a fasting lipid panel on next followup to decide whether he should start on cholesterol medication.

## 2016-02-25 NOTE — Telephone Encounter (Signed)
Pt returned my call and he is aware of fasting labs on his f/u appt 05/19/2016.

## 2016-02-25 NOTE — Telephone Encounter (Signed)
lmptcb re:He will need a fasting lipid panel on next followup to decide whether he should start on cholesterol medication.  Order has been put in epic and appt scheduled

## 2016-02-28 ENCOUNTER — Telehealth: Payer: Self-pay | Admitting: *Deleted

## 2016-02-28 DIAGNOSIS — I10 Essential (primary) hypertension: Secondary | ICD-10-CM

## 2016-02-28 NOTE — Telephone Encounter (Signed)
Left a message for pt to return my call re:  [facility] that the pt needed to have repeat BMET in 1-2 weeks.  Pt already scheduled for Lipid in June, but Wynema BirchHao wants pt to have both done at the same time. [facility]

## 2016-03-07 ENCOUNTER — Inpatient Hospital Stay: Payer: BC Managed Care – PPO | Admitting: Internal Medicine

## 2016-03-07 DIAGNOSIS — Z0289 Encounter for other administrative examinations: Secondary | ICD-10-CM

## 2016-03-14 ENCOUNTER — Encounter: Payer: Self-pay | Admitting: Internal Medicine

## 2016-03-14 ENCOUNTER — Ambulatory Visit (INDEPENDENT_AMBULATORY_CARE_PROVIDER_SITE_OTHER): Payer: BC Managed Care – PPO | Admitting: Internal Medicine

## 2016-03-14 ENCOUNTER — Other Ambulatory Visit (INDEPENDENT_AMBULATORY_CARE_PROVIDER_SITE_OTHER): Payer: BC Managed Care – PPO

## 2016-03-14 VITALS — BP 160/100 | HR 96 | Temp 97.9°F | Resp 16 | Ht 73.0 in | Wt 309.0 lb

## 2016-03-14 DIAGNOSIS — I5022 Chronic systolic (congestive) heart failure: Secondary | ICD-10-CM

## 2016-03-14 DIAGNOSIS — I4581 Long QT syndrome: Secondary | ICD-10-CM | POA: Diagnosis not present

## 2016-03-14 DIAGNOSIS — E876 Hypokalemia: Secondary | ICD-10-CM | POA: Diagnosis not present

## 2016-03-14 DIAGNOSIS — I10 Essential (primary) hypertension: Secondary | ICD-10-CM

## 2016-03-14 DIAGNOSIS — E785 Hyperlipidemia, unspecified: Secondary | ICD-10-CM

## 2016-03-14 DIAGNOSIS — R9431 Abnormal electrocardiogram [ECG] [EKG]: Secondary | ICD-10-CM

## 2016-03-14 DIAGNOSIS — T502X5A Adverse effect of carbonic-anhydrase inhibitors, benzothiadiazides and other diuretics, initial encounter: Secondary | ICD-10-CM | POA: Insufficient documentation

## 2016-03-14 DIAGNOSIS — R0683 Snoring: Secondary | ICD-10-CM | POA: Insufficient documentation

## 2016-03-14 LAB — LIPID PANEL
Cholesterol: 229 mg/dL — ABNORMAL HIGH (ref 0–200)
HDL: 40 mg/dL (ref >39.00)
LDL Cholesterol: 156 mg/dL — ABNORMAL HIGH (ref 0–99)
NONHDL: 189.41
TRIGLYCERIDES: 167 mg/dL — AB (ref 0.0–149.0)
Total CHOL/HDL Ratio: 6
VLDL: 33.4 mg/dL (ref 0.0–40.0)

## 2016-03-14 LAB — BASIC METABOLIC PANEL
BUN: 18 mg/dL (ref 6–23)
CO2: 27 mEq/L (ref 19–32)
CREATININE: 1.38 mg/dL (ref 0.40–1.50)
Calcium: 9.6 mg/dL (ref 8.4–10.5)
Chloride: 105 mEq/L (ref 96–112)
GFR: 75.72 mL/min (ref >60.00)
GLUCOSE: 113 mg/dL — AB (ref 70–99)
POTASSIUM: 3.6 meq/L (ref 3.5–5.1)
Sodium: 139 mEq/L (ref 135–145)

## 2016-03-14 LAB — MAGNESIUM: Magnesium: 2 mg/dL (ref 1.5–2.5)

## 2016-03-14 MED ORDER — FUROSEMIDE 40 MG PO TABS: 40.0000 mg | ORAL_TABLET | Freq: Two times a day (BID) | ORAL | Status: DC

## 2016-03-14 MED ORDER — POTASSIUM CHLORIDE ER 10 MEQ PO TBCR
20.0000 meq | EXTENDED_RELEASE_TABLET | Freq: Two times a day (BID) | ORAL | Status: DC
Start: 2016-03-14 — End: 2017-10-03

## 2016-03-14 MED ORDER — TELMISARTAN 80 MG PO TABS: 80.0000 mg | ORAL_TABLET | Freq: Every day | ORAL | Status: DC

## 2016-03-14 NOTE — Patient Instructions (Signed)
Hypertension Hypertension, commonly called high blood pressure, is when the force of blood pumping through your arteries is too strong. Your arteries are the blood vessels that carry blood from your heart throughout your body. A blood pressure reading consists of a higher number over a lower number, such as 110/72. The higher number (systolic) is the pressure inside your arteries when your heart pumps. The lower number (diastolic) is the pressure inside your arteries when your heart relaxes. Ideally you want your blood pressure below 120/80. Hypertension forces your heart to work harder to pump blood. Your arteries may become narrow or stiff. Having untreated or uncontrolled hypertension can cause heart attack, stroke, kidney disease, and other problems. RISK FACTORS Some risk factors for high blood pressure are controllable. Others are not.  Risk factors you cannot control include:   Race. You may be at higher risk if you are African American.  Age. Risk increases with age.  Gender. Men are at higher risk than women before age 45 years. After age 65, women are at higher risk than men. Risk factors you can control include:  Not getting enough exercise or physical activity.  Being overweight.  Getting too much fat, sugar, calories, or salt in your diet.  Drinking too much alcohol. SIGNS AND SYMPTOMS Hypertension does not usually cause signs or symptoms. Extremely high blood pressure (hypertensive crisis) may cause headache, anxiety, shortness of breath, and nosebleed. DIAGNOSIS To check if you have hypertension, your health care provider will measure your blood pressure while you are seated, with your arm held at the level of your heart. It should be measured at least twice using the same arm. Certain conditions can cause a difference in blood pressure between your right and left arms. A blood pressure reading that is higher than normal on one occasion does not mean that you need treatment. If  it is not clear whether you have high blood pressure, you may be asked to return on a different day to have your blood pressure checked again. Or, you may be asked to monitor your blood pressure at home for 1 or more weeks. TREATMENT Treating high blood pressure includes making lifestyle changes and possibly taking medicine. Living a healthy lifestyle can help lower high blood pressure. You may need to change some of your habits. Lifestyle changes may include:  Following the DASH diet. This diet is high in fruits, vegetables, and whole grains. It is low in salt, red meat, and added sugars.  Keep your sodium intake below 2,300 mg per day.  Getting at least 30-45 minutes of aerobic exercise at least 4 times per week.  Losing weight if necessary.  Not smoking.  Limiting alcoholic beverages.  Learning ways to reduce stress. Your health care provider may prescribe medicine if lifestyle changes are not enough to get your blood pressure under control, and if one of the following is true:  You are 18-59 years of age and your systolic blood pressure is above 140.  You are 60 years of age or older, and your systolic blood pressure is above 150.  Your diastolic blood pressure is above 90.  You have diabetes, and your systolic blood pressure is over 140 or your diastolic blood pressure is over 90.  You have kidney disease and your blood pressure is above 140/90.  You have heart disease and your blood pressure is above 140/90. Your personal target blood pressure may vary depending on your medical conditions, your age, and other factors. HOME CARE INSTRUCTIONS    Have your blood pressure rechecked as directed by your health care provider.   Take medicines only as directed by your health care provider. Follow the directions carefully. Blood pressure medicines must be taken as prescribed. The medicine does not work as well when you skip doses. Skipping doses also puts you at risk for  problems.  Do not smoke.   Monitor your blood pressure at home as directed by your health care provider. SEEK MEDICAL CARE IF:   You think you are having a reaction to medicines taken.  You have recurrent headaches or feel dizzy.  You have swelling in your ankles.  You have trouble with your vision. SEEK IMMEDIATE MEDICAL CARE IF:  You develop a severe headache or confusion.  You have unusual weakness, numbness, or feel faint.  You have severe chest or abdominal pain.  You vomit repeatedly.  You have trouble breathing. MAKE SURE YOU:   Understand these instructions.  Will watch your condition.  Will get help right away if you are not doing well or get worse.   This information is not intended to replace advice given to you by your health care provider. Make sure you discuss any questions you have with your health care provider.   Document Released: 11/20/2005 Document Revised: 04/06/2015 Document Reviewed: 09/12/2013 Elsevier Interactive Patient Education 2016 Elsevier Inc.  

## 2016-03-14 NOTE — Progress Notes (Signed)
Pre visit review using our clinic review tool, if applicable. No additional management support is needed unless otherwise documented below in the visit note. 

## 2016-03-14 NOTE — Progress Notes (Signed)
Subjective:  Patient ID: Steve Carrillo, male    DOB: Jun 06, 1981  Age: 35 y.o. MRN: [id number]  CC: Hypertension and Congestive Heart Failure   HPI Steve Carrillo presents for f/up on HTN and CHF. He notes that he has gained a pound over the last week and has mild edema in both lower legs. His BP has not been well controlled on his current meds but he denies HA, BV, CP, DOE, SOB, or palpitations.  . Hypertension: Admitted to the hospital with shortness breath and hypertensive urgency, was discharged on Lasix and multiple blood pressure medication. We'll check a basic metabolic panel today to make sure kidney function is okay and tolerating Lasix. His blood pressure still high 140/100 today, will increase carvedilol to 25 mg twice a day. Continue losartan 50 mg daily, continue amlodipine 5 mg daily. If BP still high on follow-up, would recommend increase amlodipine to 100 mg daily.  2. Nonischemic cardiomyopathy with EF 35-40% since 2015 - EF is essentially unchanged on echocardiogram compared to echo obtained in March 2017 with previous echo in 2015. Likely related to uncontrolled hypertension and the medication noncompliance. Discussed with the patient that he needs to be more compliant with his medication. - Also with discussed with the patient that aggressive blood pressure control. - No obvious sign of acute heart failure on exam today. He is back on his home dose of lasix which he has been taking since 2016.   3. Dilated aortic root: aortic root 4.0 cm, proximal ascending aorta 4.3 cm, advised patient to follow-up with his PCP, may need yearly CT  4. HLD: He is not on any cholesterol medication, will need to recheck his lipid panel on next visit, if still high, he will need statin medication. Previous lipid panel on December 2015 showed LDL 156, cholesterol 221.  5. Prediabetes: Hemoglobin A1c 6.0, discussed with the patient, diet and exercise for  now  6. CKD: Likely related to chronic uncontrolled hypertension   Outpatient Prescriptions Prior to Visit  Medication Sig Dispense Refill  . amLODipine (NORVASC) 5 MG tablet Take 1 tablet (5 mg total) by mouth daily. 90 tablet 0  . carvedilol (COREG) 25 MG tablet Take 1 tablet (25 mg total) by mouth 2 (two) times daily. 180 tablet 3  . furosemide (LASIX) 40 MG tablet Take 1 tablet (40 mg total) by mouth daily. 90 tablet 0  . losartan (COZAAR) 50 MG tablet Take 1 tablet (50 mg total) by mouth daily. 90 tablet 0  . potassium chloride (K-DUR) 10 MEQ tablet Take 2 tablets (20 mEq total) by mouth daily. 90 tablet 0   No facility-administered medications prior to visit.    ROS Review of Systems  Constitutional: Positive for unexpected weight change. Negative for fever, chills, diaphoresis, appetite change and fatigue.  HENT: Negative.   Eyes: Negative.   Respiratory: Positive for apnea. Negative for cough, choking, chest tightness, shortness of breath and stridor.        His wife complains about heavy snoring and he thinks he may have apnea  Cardiovascular: Positive for leg swelling. Negative for chest pain and palpitations.  Gastrointestinal: Negative.  Negative for nausea, vomiting, abdominal pain, diarrhea and constipation.  Genitourinary: Negative.   Musculoskeletal: Negative.   Skin: Negative.  Negative for color change and rash.  Neurological: Negative.  Negative for dizziness, tremors, syncope, weakness, light-headedness, numbness and headaches.  Hematological: Negative.  Negative for adenopathy. Does not bruise/bleed easily.  Psychiatric/Behavioral: Negative.  Negative for  hallucinations and dysphoric mood. The patient is not nervous/anxious.     Objective:  BP 160/100 mmHg  Pulse 96  Temp(Src) 97.9 F (36.6 C) (Oral)  Resp 16  Ht  (1.854 m)  Wt 309 lb (140.161 kg)  BMI 40.78 kg/m2  SpO2 97%  BP Readings from Last 3 Encounters:  03/14/16 160/100  02/25/16 140/100   02/09/16 153/108    Wt Readings from Last 3 Encounters:  03/14/16 309 lb (140.161 kg)  02/25/16 307 lb (139.254 kg)  02/09/16 305 lb 8.9 oz (138.6 kg)    Physical Exam  Constitutional: He is oriented to person, place, and time. He appears well-developed and well-nourished. No distress.  HENT:  Head: Normocephalic and atraumatic.  Mouth/Throat: Oropharynx is clear and moist. No oropharyngeal exudate.  Eyes: Conjunctivae are normal. Right eye exhibits no discharge. Left eye exhibits no discharge. No scleral icterus.  Neck: Normal range of motion. Neck supple. No JVD present. No tracheal deviation present. No thyromegaly present.  Cardiovascular: Normal rate, regular rhythm, S1 normal, S2 normal, normal heart sounds and intact distal pulses.  Exam reveals no gallop and no friction rub.   No murmur heard. Pulses:      Carotid pulses are 1+ on the right side, and 1+ on the left side.      Radial pulses are 1+ on the right side, and 1+ on the left side.       Femoral pulses are 1+ on the right side, and 1+ on the left side.      Popliteal pulses are 1+ on the right side, and 1+ on the left side.       Dorsalis pedis pulses are 1+ on the right side, and 1+ on the left side.       Posterior tibial pulses are 1+ on the right side, and 1+ on the left side.  EKG ---  Sinus  Rhythm  -Combined atrial enlargement.   Voltage criteria for LVH  (S(V1)+R(V6) exceeds 3.50 mV)  -Voltage criteria w/o ST/T abnormality may be normal.    Pulmonary/Chest: Effort normal and breath sounds normal. No stridor. No respiratory distress. He has no wheezes. He has no rales. He exhibits no tenderness.  Abdominal: Soft. Bowel sounds are normal. He exhibits no mass. There is no tenderness. There is no rebound and no guarding.  Musculoskeletal: Normal range of motion. He exhibits edema (trace edema in BLE). He exhibits no tenderness.  Lymphadenopathy:    He has no cervical adenopathy.  Neurological: He is oriented  to person, place, and time.  Skin: Skin is warm and dry. No rash noted. He is not diaphoretic. No erythema. No pallor.  Psychiatric: He has a normal mood and affect. His behavior is normal. Judgment and thought content normal.  Vitals reviewed.   Lab Results  Component Value Date   WBC 6.8 02/07/2016   HGB 14.0 02/07/2016   HCT 42.8 02/07/2016   PLT 324 02/07/2016   GLUCOSE 113* 03/14/2016   CHOL 229* 03/14/2016   TRIG 167.0* 03/14/2016   HDL 40.00 03/14/2016   LDLCALC 156* 03/14/2016   ALT 30 02/07/2016   AST 29 02/07/2016   NA 139 03/14/2016   K 3.6 03/14/2016   CL 105 03/14/2016   CREATININE 1.38 03/14/2016   BUN 18 03/14/2016   CO2 27 03/14/2016   TSH 0.567 02/07/2016   INR 1.09 10/22/2014   HGBA1C 6.0* 02/07/2016    Dg Chest 2 View  02/07/2016  CLINICAL DATA:  Acute onset of shortness of breath and chest pain. EXAM: CHEST  2 VIEW COMPARISON:  Radiograph 10/19/2014 FINDINGS: Cardiomegaly is unchanged. There is mild pulmonary edema. Minimal fluid in the fissure without large subpulmonic effusion. No confluent airspace disease. No pneumothorax. Osseous structures are intact. IMPRESSION: Stable cardiomegaly.  Mild pulmonary edema.  Suspect mild CHF. Electronically Signed   By: Rubye OaksMelanie  Ehinger M.D.   On: 02/07/2016 00:04    Assessment & Plan:   Lucille was seen today for hypertension and congestive heart failure.  Diagnoses and all orders for this visit:  Hypokalemia- His potassium level is only 3.6 of asked him to increase his potassium supplementation to 20 mEq once a day to 20 mEq twice a day. -     Basic metabolic panel; Future -     Magnesium; Future -     potassium chloride (K-DUR) 10 MEQ tablet; Take 2 tablets (20 mEq total) by mouth 2 (two) times daily.  Hyperlipidemia with target LDL less than 130 -     Lipid panel; Future  Essential hypertension- his blood pressure is not adequately well controlled, will change into a more potent ARB, will increase the  furosemide to twice a day instead of once a day, will replace his potassium level, will have him evaluated for sleep apnea -     Basic metabolic panel; Future -     Magnesium; Future -     furosemide (LASIX) 40 MG tablet; Take 1 tablet (40 mg total) by mouth 2 (two) times daily. -     telmisartan (MICARDIS) 80 MG tablet; Take 1 tablet (80 mg total) by mouth daily. -     potassium chloride (K-DUR) 10 MEQ tablet; Take 2 tablets (20 mEq total) by mouth 2 (two) times daily.  Chronic systolic congestive heart failure (HCC) - some improvement noted in his symptoms over the last week he is gained a pound and has peripheral edema, will increase his furosemide dose and change him to a more potent ARB. -     furosemide (LASIX) 40 MG tablet; Take 1 tablet (40 mg total) by mouth 2 (two) times daily. -     telmisartan (MICARDIS) 80 MG tablet; Take 1 tablet (80 mg total) by mouth daily.  Prolonged Q-T interval on ECG- his QT interval is normal on today's EKG and overall the EKG appears improved compared to prior EKGs. -     EKG 12-Lead  Snoring- I've asked him to see sleep medicine to be evaluated for sleep apnea. -     Ambulatory referral to Pulmonology   I have discontinued Mr. Dorena BodoGant's losartan. I have also changed his furosemide and potassium chloride. Additionally, I am having him start on telmisartan. Lastly, I am having him maintain his amLODipine and carvedilol.  Meds ordered this encounter  Medications  . furosemide (LASIX) 40 MG tablet    Sig: Take 1 tablet (40 mg total) by mouth 2 (two) times daily.    Dispense:  180 tablet    Refill:  1  . telmisartan (MICARDIS) 80 MG tablet    Sig: Take 1 tablet (80 mg total) by mouth daily.    Dispense:  90 tablet    Refill:  1  . potassium chloride (K-DUR) 10 MEQ tablet    Sig: Take 2 tablets (20 mEq total) by mouth 2 (two) times daily.    Dispense:  270 tablet    Refill:  1     Follow-up: Return in about 6 weeks (around 04/25/2016).  Sanda Linger, MD

## 2016-05-17 ENCOUNTER — Ambulatory Visit (INDEPENDENT_AMBULATORY_CARE_PROVIDER_SITE_OTHER): Payer: BC Managed Care – PPO | Admitting: Pulmonary Disease

## 2016-05-17 ENCOUNTER — Encounter: Payer: Self-pay | Admitting: Pulmonary Disease

## 2016-05-17 VITALS — BP 146/92 | HR 69 | Ht 73.0 in | Wt 312.0 lb

## 2016-05-17 DIAGNOSIS — G4733 Obstructive sleep apnea (adult) (pediatric): Secondary | ICD-10-CM | POA: Diagnosis not present

## 2016-05-17 NOTE — Progress Notes (Signed)
Subjective:    Patient ID: Job FoundsScottie [name], male    DOB: [birth year], 35 y.o.   MRN: [id number]  HPI Chief Complaint  Patient presents with  . Sleep Consult    Referred by Dr. Yetta Carrillo for OSA. Epworth: 679.   35 year old obese man presents for evaluation of sleep-disordered breathing. He has difficult to control hypertension and nonischemic cardiomyopathy with a negative cardiac In 2015. He was hospitalized in 02/2016 for hypertensive crisis and congestive heart failure. His girlfriend's witnessed apneas and loud snoring. Epworth sleepiness score is 9. He works the second shift as a housekeeping at [employer] and his bedtime is somewhat late around 2 AM. Sleep latency is minimal, sleeps on his side with one pillow, denies nocturnal awakenings other than occasional bathroom visits and is out of bed by 9 AM feeling rested with occasional dryness of mouth but denies headaches. He is gained 10 pounds over the last 2 years and has always been a big person with his current weight around 310 pounds  There is no history suggestive of cataplexy, sleep paralysis or parasomnias Is no history of orthopnea or paroxysmal nocturnal dyspnea, he is compliant with Lasix    Past Medical History  Diagnosis Date  . Hypertension   . Cardiomyopathy due to hypertension (HCC) 10/2014    NICM EF 35-40%, global LV strain is abnormal at -10.9%  . Hyperlipidemia LDL goal <130 2015  . Prediabetes   . Dilated aortic root (HCC)     seen on previous echo 02/2016   Past Surgical History  Procedure Laterality Date  . Hernia repair    . Left heart catheterization with coronary angiogram N/A 10/22/2014    Procedure: LEFT HEART CATHETERIZATION WITH CORONARY ANGIOGRAM;  Surgeon: Peter M SwazilandJordan, MD; Normal coronary anatomy. Vessels are very large due to hypertensive cardiomyopathy.      No Known Allergies  Social History   Social History  . Marital Status: Single    Spouse Name: N/A  . Number of Children: N/A  . Years  of Education: N/A   Occupational History  . Janitorial [employer]   Social History Main Topics  . Smoking status: Former Smoker    Quit date: 09/14/2014  . Smokeless tobacco: Never Used  . Alcohol Use: Yes     Comment: Twice a month  . Drug Use: No  . Sexual Activity: Not on file   Other Topics Concern  . Not on file   Social History Narrative   Lives with girlfriend.      Family History  Problem Relation Age of Onset  . Diabetes Mellitus II Mother   . Hypertension Mother      Review of Systems Constitutional: negative for anorexia, fevers and sweats  Eyes: negative for irritation, redness and visual disturbance  Ears, nose, mouth, throat, and face: negative for earaches, epistaxis, nasal congestion and sore throat  Respiratory: negative for cough, dyspnea on exertion, sputum and wheezing  Cardiovascular: negative for chest pain, dyspnea, lower extremity edema, orthopnea, palpitations and syncope  Gastrointestinal: negative for abdominal pain, constipation, diarrhea, melena, nausea and vomiting  Genitourinary:negative for dysuria, frequency and hematuria  Hematologic/lymphatic: negative for bleeding, easy bruising and lymphadenopathy  Musculoskeletal:negative for arthralgias, muscle weakness and stiff joints  Neurological: negative for coordination problems, gait problems, headaches and weakness  Endocrine: negative for diabetic symptoms including polydipsia, polyuria and weight loss     Objective:   Physical Exam  Gen. Pleasant, obese, in no distress ENT - no lesions,  no post nasal drip Neck: No JVD, no thyromegaly, no carotid bruits Lungs: no use of accessory muscles, no dullness to percussion, decreased without rales or rhonchi  Cardiovascular: Rhythm regular, heart sounds  normal, no murmurs or gallops, no peripheral edema Musculoskeletal: No deformities, no cyanosis or clubbing , no tremors       Assessment & Plan:

## 2016-05-17 NOTE — Patient Instructions (Signed)
Schedule split night study

## 2016-05-17 NOTE — Assessment & Plan Note (Signed)
Given excessive daytime somnolence, narrow pharyngeal exam, witnessed apneas & loud snoring, obstructive sleep apnea is very likely & an overnight polysomnogram will be scheduled as a split study. The pathophysiology of obstructive sleep apnea , it's cardiovascular consequences & modes of treatment including CPAP were discused with the patient in detail & they evidenced understanding.  

## 2016-05-19 ENCOUNTER — Ambulatory Visit: Payer: BC Managed Care – PPO | Admitting: Cardiovascular Disease

## 2016-05-19 ENCOUNTER — Other Ambulatory Visit: Payer: BC Managed Care – PPO

## 2016-06-22 ENCOUNTER — Encounter: Payer: Self-pay | Admitting: Cardiovascular Disease

## 2016-06-22 ENCOUNTER — Ambulatory Visit (INDEPENDENT_AMBULATORY_CARE_PROVIDER_SITE_OTHER): Payer: BC Managed Care – PPO | Admitting: Cardiovascular Disease

## 2016-06-22 VITALS — BP 157/113 | HR 90 | Ht 73.0 in | Wt 312.2 lb

## 2016-06-22 DIAGNOSIS — N182 Chronic kidney disease, stage 2 (mild): Secondary | ICD-10-CM | POA: Insufficient documentation

## 2016-06-22 DIAGNOSIS — I10 Essential (primary) hypertension: Secondary | ICD-10-CM | POA: Diagnosis not present

## 2016-06-22 DIAGNOSIS — I5042 Chronic combined systolic (congestive) and diastolic (congestive) heart failure: Secondary | ICD-10-CM | POA: Diagnosis not present

## 2016-06-22 DIAGNOSIS — E785 Hyperlipidemia, unspecified: Secondary | ICD-10-CM

## 2016-06-22 DIAGNOSIS — G4733 Obstructive sleep apnea (adult) (pediatric): Secondary | ICD-10-CM

## 2016-06-22 DIAGNOSIS — Z79899 Other long term (current) drug therapy: Secondary | ICD-10-CM

## 2016-06-22 MED ORDER — LISINOPRIL 20 MG PO TABS: 20.0000 mg | ORAL_TABLET | Freq: Every day | ORAL | Status: DC

## 2016-06-22 MED ORDER — AMLODIPINE BESYLATE 10 MG PO TABS
10.0000 mg | ORAL_TABLET | Freq: Every day | ORAL | Status: DC
Start: 2016-06-22 — End: 2017-10-03

## 2016-06-22 NOTE — Progress Notes (Signed)
Cardiology Office Note    Date:  06/22/2016   ID:  Steve Carrillo, DOB [birth year], MRN [id number]  PCP:  Sanda Linger, MD  Cardiologist:   Thurmon Fair, MD   Chief Complaint  Patient presents with  . Follow-up    3 months; Pt states no Sx    History of Present Illness:  Steve Carrillo is a 35 y.o. male with severe systemic hypertension, hypertensive heart disease (left ventricular hypertrophy and moderately depressed left ventricular systolic function) with compensated heart failure, mild chronic kidney disease, mildly dilated aortic root, hyperlipidemia, obesity and prediabetes. He returns for follow-up and his only complaint is of fatigue and drowsiness. He has a sleep study scheduled for August. He denies shortness of breath, chest pain, edema, syncope or palpitations. His blood pressure remains persistently elevated.  Amlodipine was added at his last appointment. He is on maximum dose of carvedilol and takes a relatively low dose of furosemide. Renal function has been stable.  Past Medical History  Diagnosis Date  . Hypertension   . Cardiomyopathy due to hypertension (HCC) 10/2014    NICM EF 35-40%, global LV strain is abnormal at -10.9%  . Hyperlipidemia LDL goal <130 2015  . Prediabetes   . Dilated aortic root (HCC)     seen on previous echo 02/2016    Past Surgical History  Procedure Laterality Date  . Hernia repair    . Left heart catheterization with coronary angiogram N/A 10/22/2014    Procedure: LEFT HEART CATHETERIZATION WITH CORONARY ANGIOGRAM;  Surgeon: Peter M Swaziland, MD; Normal coronary anatomy. Vessels are very large due to hypertensive cardiomyopathy.     Current Medications: Outpatient Prescriptions Prior to Visit  Medication Sig Dispense Refill  . carvedilol (COREG) 25 MG tablet Take 1 tablet (25 mg total) by mouth 2 (two) times daily. 180 tablet 3  . furosemide (LASIX) 40 MG tablet Take 1 tablet (40 mg total) by mouth 2 (two) times daily. 180 tablet  1  . potassium chloride (K-DUR) 10 MEQ tablet Take 2 tablets (20 mEq total) by mouth 2 (two) times daily. 270 tablet 1  . amLODipine (NORVASC) 5 MG tablet Take 1 tablet (5 mg total) by mouth daily. 90 tablet 0  . telmisartan (MICARDIS) 80 MG tablet Take 1 tablet (80 mg total) by mouth daily. 90 tablet 1   No facility-administered medications prior to visit.     Allergies:   Review of patient's allergies indicates no known allergies.   Social History   Social History  . Marital Status: Single    Spouse Name: N/A  . Number of Children: N/A  . Years of Education: N/A   Occupational History  . [employer]   Social History Main Topics  . Smoking status: Former Smoker    Quit date: 09/14/2014  . Smokeless tobacco: Never Used  . Alcohol Use: Yes     Comment: Twice a month  . Drug Use: No  . Sexual Activity: Not Asked   Other Topics Concern  . None   Social History Narrative   Lives with girlfriend.      Family History:  The patient's family history includes Diabetes Mellitus II in his mother; Hypertension in his mother.   ROS:   Please see the history of present illness.    ROS All other systems reviewed and are negative.   PHYSICAL EXAM:   VS:  BP 157/113 mmHg  Pulse 90  Ht  (1.854 m)  Wt 141.613  kg (312 lb 3.2 oz)  BMI 41.20 kg/m2   GEN: Well nourished, well developed, in no acute distress. Morbidly obese HEENT: normal Neck: no JVD, carotid bruits, or masses Cardiac: Laterally displaced apical impulse, but difficult to define, RRR; no murmurs, rubs; S4 gallop is present,no edema  Respiratory:  clear to auscultation bilaterally, normal work of breathing GI: soft, nontender, nondistended, + BS MS: no deformity or atrophy Skin: warm and dry, no rash Neuro:  Alert and Oriented x 3, Strength and sensation are intact Psych: euthymic mood, full affect  Wt Readings from Last 3 Encounters:  06/22/16 141.613 kg (312 lb 3.2 oz)  05/17/16 141.522 kg (312  lb)  03/14/16 140.161 kg (309 lb)      Studies/Labs Reviewed:   EKG:  EKG is not ordered today.   Recent Labs: 02/06/2016: B Natriuretic Peptide 242.9* 02/07/2016: ALT 30; Hemoglobin 14.0; Platelets 324; TSH 0.567 03/14/2016: BUN 18; Creatinine, Ser 1.38; Magnesium 2.0; Potassium 3.6; Sodium 139   Lipid Panel    Component Value Date/Time   CHOL 229* 03/14/2016 1102   TRIG 167.0* 03/14/2016 1102   HDL 40.00 03/14/2016 1102   CHOLHDL 6 03/14/2016 1102   VLDL 33.4 03/14/2016 1102   LDLCALC 156* 03/14/2016 1102    ASSESSMENT:    1. Chronic combined systolic and diastolic congestive heart failure (HCC)   2. Essential hypertension, malignant   3. Chronic kidney disease, stage 2, mildly decreased GFR   4. Hyperlipidemia with target LDL less than 130   5. Obstructive sleep apnea   6. Morbid obesity due to excess calories (HCC)   7. Medication management      PLAN:  In order of problems listed above:  1. CHF: He appears to be clinically euvolemic. Add ACE inhibitor and recheck labs in a few weeks. Continue max dose carvedilol and low-dose loop diuretic. reevaluate left ventricular ejection fraction once the get his blood pressure controlled  2. Malignant hypertension: I think he will need a calcium channel blocker to normalize his blood pressure, even if we add the ACE inhibitor. Will increase the amlodipine dose to 10 mg daily. 3. CKD: reevaluate kidney function after adding the ACE inhibitor. If this remains stable with plan to increase lisinopril to 40 mg daily in about a month's time. He will see our clinical pharmacist Phillips HayKristin Alvstad in a month and I'll see him back a month after that. 4. HLP: Will get him to have a fasting lipid profile performed after he sees Gasper LloydKristin Alstad in one month 5. OSA: Initiating treatment for this will help with blood pressure management and overall outcome 6. Strongly encouraged to follow-up calorie restricted diet and lose weight, he also needs to  start exercising regularly by walking    Medication Adjustments/Labs and Tests Ordered: Current medicines are reviewed at length with the patient today.  Concerns regarding medicines are outlined above.  Medication changes, Labs and Tests ordered today are listed in the Patient Instructions below. Patient Instructions  Medication Instructions: Dr Royann Shiversroitoru has recommended making the following medication changes: 1. INCREASE Amlodipine to 10 mg daily 2. START Lisinopril 20 mg - take 1 tablet by mouth daily  Labwork: Your physician recommends that you return for lab work in 2 weeks.  Testing/Procedures: NONE ORDERED  Follow-up: Your physician recommends that you schedule a follow-up appointment in 1 month with one of our pharmacists.  Dr Royann Shiversroitoru recommends that you schedule a follow-up appointment first available.  If you need a refill on your cardiac  medications before your next appointment, please call your pharmacy.     Signed, Thurmon Fair, MD  06/22/2016 11:15 AM    [facility] [address] [place], [place], [place]  [address] Phone: [phone]; Fax: [phone]

## 2016-06-22 NOTE — Patient Instructions (Signed)
Medication Instructions: Dr Royann Shiversroitoru has recommended making the following medication changes: 1. INCREASE Amlodipine to 10 mg daily 2. START Lisinopril 20 mg - take 1 tablet by mouth daily  Labwork: Your physician recommends that you return for lab work in 2 weeks.  Testing/Procedures: NONE ORDERED  Follow-up: Your physician recommends that you schedule a follow-up appointment in 1 month with one of our pharmacists.  Dr Royann Shiversroitoru recommends that you schedule a follow-up appointment first available.  If you need a refill on your cardiac medications before your next appointment, please call your pharmacy.

## 2016-06-22 NOTE — Progress Notes (Signed)
You started lisinopril today but he is already on telmisartan 80.  Was he to stop that?

## 2016-07-04 ENCOUNTER — Ambulatory Visit (HOSPITAL_BASED_OUTPATIENT_CLINIC_OR_DEPARTMENT_OTHER): Payer: BC Managed Care – PPO | Attending: Pulmonary Disease | Admitting: Pulmonary Disease

## 2016-07-04 DIAGNOSIS — Z6841 Body Mass Index (BMI) 40.0 and over, adult: Secondary | ICD-10-CM | POA: Diagnosis not present

## 2016-07-04 DIAGNOSIS — E669 Obesity, unspecified: Secondary | ICD-10-CM | POA: Insufficient documentation

## 2016-07-04 DIAGNOSIS — G4733 Obstructive sleep apnea (adult) (pediatric): Secondary | ICD-10-CM | POA: Diagnosis not present

## 2016-07-04 DIAGNOSIS — R0683 Snoring: Secondary | ICD-10-CM | POA: Insufficient documentation

## 2016-07-04 DIAGNOSIS — I10 Essential (primary) hypertension: Secondary | ICD-10-CM | POA: Diagnosis not present

## 2016-07-14 DIAGNOSIS — G4733 Obstructive sleep apnea (adult) (pediatric): Secondary | ICD-10-CM | POA: Diagnosis not present

## 2016-07-14 NOTE — Procedures (Signed)
Patient Name: Steve Carrillo, Merel Study Date: 07/04/2016 Gender: Male D.O.B: [birth year] Age (years): [age] Referring Provider: Cyril Mourningakesh Alahia Whicker MD, ABSM Height (inches): 73 Interpreting Physician: Cyril Mourningakesh Wacey Zieger MD, ABSM Weight (lbs): 312 RPSGT: Shelah LewandowskyGregory, Kenyon BMI: 41 MRN: [id number] Neck Size: 18.50  CLINICAL INFORMATION Sleep Study Type: NPSG Indication for sleep study: Hypertension, Obesity, OSA, Witnessed Apneas Epworth Sleepiness Score: 6   SLEEP STUDY TECHNIQUE As per the AASM Manual for the Scoring of Sleep and Associated Events v2.3 (April 2016) with a hypopnea requiring 4% desaturations. The channels recorded and monitored were frontal, central and occipital EEG, electrooculogram (EOG), submentalis EMG (chin), nasal and oral airflow, thoracic and abdominal wall motion, anterior tibialis EMG, snore microphone, electrocardiogram, and pulse oximetry.   SLEEP ARCHITECTURE The study was initiated at 10:31:01 PM and ended at 4:45:43 AM. Sleep onset time was 67.7 minutes and the sleep efficiency was 68.7%. The total sleep time was 257.5 minutes. Stage REM latency was 107.0 minutes. The patient spent 13.79% of the night in stage N1 sleep, 76.70% in stage N2 sleep, 0.00% in stage N3 and 9.51% in REM. Alpha intrusion was absent. Supine sleep was 8.93%.   RESPIRATORY PARAMETERS The overall apnea/hypopnea index (AHI) was 5.1 per hour. There were 0 total apneas, including 0 obstructive, 0 central and 0 mixed apneas. There were 22 hypopneas and 17 RERAs. The AHI during Stage REM sleep was 36.7 per hour. AHI while supine was 7.8 per hour. The mean oxygen saturation was 95.16%. The minimum SpO2 during sleep was 86.00%. Moderate snoring was noted during this study.   CARDIAC DATA The 2 lead EKG demonstrated sinus rhythm. The mean heart rate was 81.19 beats per minute. Other EKG findings include: None.   LEG MOVEMENT DATA The total PLMS were 0 with a resulting PLMS index of 0.00. Associated  arousal with leg movement index was 0.0 .   IMPRESSIONS - Mild obstructive sleep apnea occurred during this study (AHI = 5.1/h). - No significant central sleep apnea occurred during this study (CAI = 0.0/h). - Mild oxygen desaturation was noted during this study (Min O2 = 86.00%). - The patient snored with Moderate snoring volume. - No cardiac abnormalities were noted during this study. - Clinically significant periodic limb movements did not occur during sleep. No significant associated arousals.   DIAGNOSIS - Obstructive Sleep Apnea (327.23 [G47.33 ICD-10])   RECOMMENDATIONS - Positional therapy avoiding supine position during sleep. - Very mild obstructive sleep apnea. Return to discuss treatment options.The cardiovascular consequences of this degree of sleep-disordered breathing is debatable. - Avoid alcohol, sedatives and other CNS depressants that may worsen sleep apnea and disrupt normal sleep architecture. - Sleep hygiene should be reviewed to assess factors that may improve sleep quality. - Weight management and regular exercise should be initiated or continued if appropriate.   Cyril Mourningakesh Zen Cedillos MD. Tonny BollmanFCCP. [facility]   07/14/2016

## 2016-07-17 ENCOUNTER — Telehealth: Payer: Self-pay | Admitting: *Deleted

## 2016-07-17 NOTE — Telephone Encounter (Signed)
-----   Message from Oretha Milchakesh V Alva, MD sent at 07/17/2016 11:13 AM EDT ----- If NP, let me know whom, that they can discuss plan with them ----- Message ----- From: Karleen HampshireMichelle P Ethelreda Sukhu, CMA Sent: 07/17/2016  10:41 AM To: Oretha Milchakesh V Alva, MD  Can patient see NP or does this appointment need to be with you?  ----- Message ----- From: Oretha Milchakesh V Alva, MD Sent: 07/14/2016   2:53 PM To: Karleen HampshireMichelle P Jodine Muchmore, CMA  Needs follow-up office visit to discuss sleep study and treatment options

## 2016-07-17 NOTE — Telephone Encounter (Signed)
Patient needs OV with TP or RA in [facility] office for Sleep study follow up.   Called and left message to call back.

## 2016-07-18 NOTE — Telephone Encounter (Signed)
[facility]  To RA as requested so he can discuss plans with TP prior to patient's visit.  FYI to TP

## 2016-07-20 ENCOUNTER — Ambulatory Visit: Payer: BC Managed Care – PPO | Admitting: Adult Health

## 2016-07-25 ENCOUNTER — Ambulatory Visit: Payer: BC Managed Care – PPO

## 2016-08-03 ENCOUNTER — Encounter: Payer: Self-pay | Admitting: Cardiovascular Disease

## 2016-08-03 ENCOUNTER — Ambulatory Visit: Payer: BC Managed Care – PPO | Admitting: Adult Health

## 2016-08-03 ENCOUNTER — Ambulatory Visit (INDEPENDENT_AMBULATORY_CARE_PROVIDER_SITE_OTHER): Payer: BC Managed Care – PPO | Admitting: Cardiovascular Disease

## 2016-08-03 ENCOUNTER — Encounter (INDEPENDENT_AMBULATORY_CARE_PROVIDER_SITE_OTHER): Payer: Self-pay

## 2016-08-03 VITALS — BP 139/94 | HR 83 | Ht 73.0 in | Wt 311.0 lb

## 2016-08-03 DIAGNOSIS — G4733 Obstructive sleep apnea (adult) (pediatric): Secondary | ICD-10-CM | POA: Diagnosis not present

## 2016-08-03 DIAGNOSIS — I10 Essential (primary) hypertension: Secondary | ICD-10-CM

## 2016-08-03 DIAGNOSIS — I5022 Chronic systolic (congestive) heart failure: Secondary | ICD-10-CM | POA: Diagnosis not present

## 2016-08-03 DIAGNOSIS — N182 Chronic kidney disease, stage 2 (mild): Secondary | ICD-10-CM | POA: Diagnosis not present

## 2016-08-03 DIAGNOSIS — E785 Hyperlipidemia, unspecified: Secondary | ICD-10-CM

## 2016-08-03 MED ORDER — LISINOPRIL 20 MG PO TABS: 20.0000 mg | ORAL_TABLET | Freq: Every day | ORAL | 11 refills | Status: DC

## 2016-08-03 NOTE — Progress Notes (Signed)
Cardiology Office Note    Date:  08/03/2016   ID:  Steve FoundsScottie [name], DOB 06-Oct-1981, MRN [id number]  PCP:  Sanda Lingerhomas Jones, MD  Cardiologist:   Thurmon FairMihai Carlen Fils, MD   Chief Complaint  Patient presents with  . Follow-up    Pt state no Sx or concerns.    History of Present Illness:  Steve Carrillo is a 35 y.o. male with severe systemic hypertension, hypertensive heart disease (left ventricular hypertrophy and moderately depressed left ventricular systolic function) with compensated heart failure, mild chronic kidney disease, mildly dilated aortic root, hyperlipidemia, obesity and prediabetes. H  He denies shortness of breath, chest pain, edema, syncope or palpitations. His blood pressure remains persistently elevated, Has definitely improved. 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 Sleep study showed very mild obstructive sleep apnea and CPAP was not recommended.  Lisinopril was prescribed at his last appointment, but he has not started taking this medication yet. He is on maximum dose of carvedilol and takes a relatively low dose of furosemide. Renal function has been stable, even shown a slow gradual improvement once his blood pressure treatment began..  Past Medical History:  Diagnosis Date  . 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 Dilated aortic root (HCC)    seen on previous echo 02/2016  . Hyperlipidemia LDL goal <130 2015  . Hypertension   . Prediabetes     Past Surgical History:  Procedure Laterality Date  . HERNIA REPAIR    . LEFT HEART CATHETERIZATION WITH CORONARY ANGIOGRAM N/A 10/22/2014   Procedure: LEFT HEART CATHETERIZATION WITH CORONARY ANGIOGRAM;  Surgeon: Peter M SwazilandJordan, MD; Normal coronary anatomy. Vessels are very large due to hypertensive cardiomyopathy.     Current Medications: Outpatient Medications Prior to Visit  Medication Sig Dispense  Refill  . amLODipine (NORVASC) 10 MG tablet Take 1 tablet (10 mg total) by mouth daily. 90 tablet 3  . carvedilol (COREG) 25 MG tablet Take 1 tablet (25 mg total) by mouth 2 (two) times daily. 180 tablet 3  . furosemide (LASIX) 40 MG tablet Take 1 tablet (40 mg total) by mouth 2 (two) times daily. 180 tablet 1  . potassium chloride (K-DUR) 10 MEQ tablet Take 2 tablets (20 mEq total) by mouth 2 (two) times daily. 270 tablet 1  . lisinopril (PRINIVIL,ZESTRIL) 20 MG tablet Take 1 tablet (20 mg total) by mouth daily. 90 tablet 3   No facility-administered medications prior to visit.      Allergies:   Review of patient's allergies indicates no known allergies.   Social History   Social History  . Marital status: Single    Spouse name: N/A  . Number of children: N/A  . Years of education: N/A   Occupational History  . [employer]   Social History Main Topics  . Smoking status: Former Smoker    Quit date: 09/14/2014  . Smokeless tobacco: Never Used  . Alcohol use Yes     Comment: Twice a month  . Drug use: No  . Sexual activity: Not on file   Other Topics Concern  . Not on file   Social History Narrative   Lives with girlfriend.      Family History:  The patient's family history includes Diabetes Mellitus II in his mother; Hypertension in his mother.   ROS:   Please see the history of present illness.  ROS All other systems reviewed and are negative.   PHYSICAL EXAM:   VS:  BP (!) 139/94   Pulse 83   Ht 6\' 1"  (1.854 m)   Wt (!) 311 lb (141.1 kg)   BMI 41.03 kg/m    GEN: Well nourished, well developed, in no acute distress. Morbidly obese HEENT: normal Neck: no JVD, carotid bruits, or masses Cardiac: Laterally displaced apical impulse, but difficult to define, RRR; no murmurs, rubs; S4 gallop is present,no edema  Respiratory:  clear to auscultation bilaterally, normal work of breathing GI: soft, nontender, nondistended, + BS MS: no deformity or  atrophy Skin: warm and dry, no rash Neuro:  Alert and Oriented x 3, Strength and sensation are intact Psych: euthymic mood, full affect  Wt Readings from Last 3 Encounters:  08/03/16 (!) 311 lb (141.1 kg)  07/04/16 (!) 312 lb (141.5 kg)  06/22/16 (!) 312 lb 3.2 oz (141.6 kg)      Studies/Labs Reviewed:   EKG:  EKG is not ordered today.   Recent Labs: 02/06/2016: B Natriuretic Peptide 242.9 02/07/2016: ALT 30; Hemoglobin 14.0; Platelets 324; TSH 0.567 03/14/2016: BUN 18; Creatinine, Ser 1.38; Magnesium 2.0; Potassium 3.6; Sodium 139   Lipid Panel    Component Value Date/Time   CHOL 229 (H) 03/14/2016 1102   TRIG 167.0 (H) 03/14/2016 1102   HDL 40.00 03/14/2016 1102   CHOLHDL 6 03/14/2016 1102   VLDL 33.4 03/14/2016 1102   LDLCALC 156 (H) 03/14/2016 1102    ASSESSMENT:    1. Essential hypertension   2. Chronic systolic congestive heart failure (HCC)   3. Cardiomyopathy (HCC)      PLAN:  In order of problems listed above:  1. CHF: He appears to be clinically euvolemic. Add ACE inhibitor and recheck labs in a few weeks. Continue max dose carvedilol and low-dose loop diuretic. reevaluate left ventricular ejection fraction once the get his blood pressure controlled  2. Malignant hypertension: I think he will need a calcium channel blocker to normalize his blood pressure, even if we add the ACE inhibitor. Will increase the amlodipine dose to 10 mg daily. 3. CKD: reevaluate kidney function after adding the ACE inhibitor. If this remains stable with plan to increase lisinopril to 40 mg daily in about a month's time. He will see our clinical pharmacist Landis Martins or Phillips Hay in a month and have an echo in a couple of months, then I'll see him back a month after that. 4. HLP: Will get him to have a fasting lipid profile performed after he sees Gasper Lloyd in one month 5. OSA (mild): It appears he does not need CPAP at this point. Discussed weight loss and sleep  hygiene. 6. Strongly encouraged to follow-up calorie restricted diet and lose weight, he also needs to start exercising regularly by walking    Medication Adjustments/Labs and Tests Ordered: Current medicines are reviewed at length with the patient today.  Concerns regarding medicines are outlined above.  Medication changes, Labs and Tests ordered today are listed in the Patient Instructions below. Patient Instructions  MEDICATION ADDITION  LISINOPRIL 20 MG ONE TABLET DAILY BY MOUTH  Your physician recommends that you schedule a follow-up appointment in: 2 WEEKS WITH  [facility]- BLOOD PRESSURE CHECK  SCHEDULE FOR 2 MONTHS Your physician has requested that you have an echocardiogram AT [address]. Echocardiography is a painless test that uses sound waves to create images of your heart. It provides your doctor with  information about the size and shape of your heart and how well your heart's chambers and valves are working. This procedure takes approximately one hour. There are no restrictions for this procedure.  Your physician recommends that you schedule a follow-up appointment in: 3 MONTHS WITH DR Daneya Hartgrove.  If you need a refill on your cardiac medications before your next appointment, please call your pharmacy.         Signed, Thurmon Fair, MD  08/03/2016 1:03 PM    [facility] [address] [place], [place], [place]  [address] Phone: [phone]; Fax: [phone]

## 2016-08-03 NOTE — Patient Instructions (Signed)
MEDICATION ADDITION  LISINOPRIL 20 MG ONE TABLET DAILY BY MOUTH  Your physician recommends that you schedule a follow-up appointment in: 2 WEEKS WITH  [facility]- BLOOD PRESSURE CHECK  SCHEDULE FOR 2 MONTHS Your physician has requested that you have an echocardiogram AT [address]. Echocardiography is a painless test that uses sound waves to create images of your heart. It provides your doctor with information about the size and shape of your heart and how well your heart's chambers and valves are working. This procedure takes approximately one hour. There are no restrictions for this procedure.  Your physician recommends that you schedule a follow-up appointment in: 3 MONTHS WITH DR CROITORU.  If you need a refill on your cardiac medications before your next appointment, please call your pharmacy.

## 2016-08-10 ENCOUNTER — Ambulatory Visit: Payer: BC Managed Care – PPO | Admitting: Adult Health

## 2016-08-17 ENCOUNTER — Ambulatory Visit: Payer: BC Managed Care – PPO

## 2016-08-23 ENCOUNTER — Other Ambulatory Visit (HOSPITAL_COMMUNITY): Payer: BC Managed Care – PPO

## 2016-08-30 ENCOUNTER — Ambulatory Visit: Payer: BC Managed Care – PPO

## 2016-09-18 ENCOUNTER — Ambulatory Visit: Payer: BC Managed Care – PPO

## 2016-09-18 NOTE — Progress Notes (Deleted)
Patient ID: Job FoundsScottie Steve Carrillo                 DOB: 1980/12/30                      MRN: [id number]     HPI: Steve Shelton SilvasJ Carrillo is a 35 y.o. male patient of Dr. Marland Kitchen*** with PMH below who presents today for hypertension evaluation.   Cardiac Hx:   Current HTN meds:    Previously tried:   BP goal:   Family History:   Social History:   Diet:   Exercise:   Home BP readings:   Wt Readings from Last 3 Encounters:  08/03/16 (!) 311 lb (141.1 kg)  07/04/16 (!) 312 lb (141.5 kg)  06/22/16 (!) 312 lb 3.2 oz (141.6 kg)   BP Readings from Last 3 Encounters:  08/03/16 (!) 139/94  06/22/16 (!) 157/113  05/17/16 (!) 146/92   Pulse Readings from Last 3 Encounters:  08/03/16 83  06/22/16 90  05/17/16 69    Renal function: CrCl cannot be calculated (Unknown ideal weight.).  Past Medical History:  Diagnosis Date  . 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 Dilated aortic root (HCC)    seen on previous echo 02/2016  . Hyperlipidemia LDL goal <130 2015  . Hypertension   . Prediabetes     Current Outpatient Prescriptions on File Prior to Visit  Medication Sig Dispense Refill  . amLODipine (NORVASC) 10 MG tablet Take 1 tablet (10 mg total) by mouth daily. 90 tablet 3  . carvedilol (COREG) 25 MG tablet Take 1 tablet (25 mg total) by mouth 2 (two) times daily. 180 tablet 3  . furosemide (LASIX) 40 MG tablet Take 1 tablet (40 mg total) by mouth 2 (two) times daily. 180 tablet 1  . lisinopril (PRINIVIL,ZESTRIL) 20 MG tablet Take 1 tablet (20 mg total) by mouth daily. 30 tablet 11  . potassium chloride (K-DUR) 10 MEQ tablet Take 2 tablets (20 mEq total) by mouth 2 (two) times daily. 270 tablet 1   No current facility-administered medications on file prior to visit.     No Known Allergies  There were no vitals taken for this visit.   Assessment/Plan: Hypertension:    Thank you, Freddie ApleyKelley M. Cleatis PolkaAuten, PharmD  [facility]  09/18/2016 7:19 AM

## 2016-09-27 ENCOUNTER — Telehealth (HOSPITAL_COMMUNITY): Payer: Self-pay | Admitting: Cardiovascular Disease

## 2016-09-27 NOTE — Telephone Encounter (Signed)
Pt has been contacted a few times(9/20,10/5,10/25) to get his Echo rescheduled and have yet to receive a call back. Dr. Erin Hearingroitoru's nurse has been notified and she will be removed from the workqueue. If in fact the patient does call back the test will be reordered by the ordering physician.

## 2016-09-28 ENCOUNTER — Ambulatory Visit: Payer: BC Managed Care – PPO

## 2016-10-18 ENCOUNTER — Ambulatory Visit (HOSPITAL_COMMUNITY): Payer: BC Managed Care – PPO | Attending: Cardiology

## 2016-10-18 ENCOUNTER — Encounter (HOSPITAL_COMMUNITY): Payer: Self-pay | Admitting: Radiology

## 2016-10-18 NOTE — CV Procedure (Signed)
Patient did not show for scheduled echocardiogram. 

## 2016-10-19 ENCOUNTER — Ambulatory Visit (INDEPENDENT_AMBULATORY_CARE_PROVIDER_SITE_OTHER): Payer: BC Managed Care – PPO | Admitting: Pharmacist

## 2016-10-19 ENCOUNTER — Encounter: Payer: Self-pay | Admitting: Pharmacist

## 2016-10-19 VITALS — BP 142/108 | HR 68

## 2016-10-19 DIAGNOSIS — I10 Essential (primary) hypertension: Secondary | ICD-10-CM | POA: Diagnosis not present

## 2016-10-19 DIAGNOSIS — E785 Hyperlipidemia, unspecified: Secondary | ICD-10-CM | POA: Diagnosis not present

## 2016-10-19 LAB — LIPID PANEL
CHOL/HDL RATIO: 5.3 ratio — AB (ref <5.0)
Cholesterol: 197 mg/dL (ref <200)
HDL: 37 mg/dL — AB (ref >40)
LDL CALC: 141 mg/dL — AB (ref <100)
Triglycerides: 94 mg/dL (ref <150)
VLDL: 19 mg/dL (ref <30)

## 2016-10-19 LAB — HEPATIC FUNCTION PANEL
ALBUMIN: 4.1 g/dL (ref 3.6–5.1)
ALK PHOS: 90 U/L (ref 40–115)
ALT: 18 U/L (ref 9–46)
AST: 15 U/L (ref 10–40)
BILIRUBIN TOTAL: 0.3 mg/dL (ref 0.2–1.2)
Bilirubin, Direct: 0.1 mg/dL (ref <=0.2)
Indirect Bilirubin: 0.2 mg/dL (ref 0.2–1.2)
Total Protein: 7 g/dL (ref 6.1–8.1)

## 2016-10-19 LAB — BASIC METABOLIC PANEL
BUN: 13 mg/dL (ref 7–25)
CHLORIDE: 106 mmol/L (ref 98–110)
CO2: 26 mmol/L (ref 20–31)
CREATININE: 1.32 mg/dL (ref 0.60–1.35)
Calcium: 9.5 mg/dL (ref 8.6–10.3)
Glucose, Bld: 108 mg/dL — ABNORMAL HIGH (ref 65–99)
Potassium: 4.1 mmol/L (ref 3.5–5.3)
Sodium: 139 mmol/L (ref 135–146)

## 2016-10-19 MED ORDER — LISINOPRIL 40 MG PO TABS
40.0000 mg | ORAL_TABLET | Freq: Every day | ORAL | 11 refills | Status: DC
Start: 2016-10-19 — End: 2017-10-03

## 2016-10-19 NOTE — Progress Notes (Signed)
Patient ID: Job FoundsScottie J Kinchen                 DOB: [birth year]                      MRN: [id number]     HPI: Steve Shelton SilvasJ Ramaker is a 35 y.o. male patient of Dr. Royann Shiversroitoru with PMH below who presents today for medication titration. He was recently started on lisinopril 20mg .   He takes all of his medications in the morning, including both doses of carvedilol and lasix. He reports that he frequently is somewhat lethargic after taking medications and he states this is why he takes them all together. He reports taking all the doses together also keeps him from missing doses. He states he usually can not remember to take them in the afternoon.   He has not noticed any appreciable different in how he has been feeling since starting lisinopril.   He reports that he has not yet taking his medications this morning and he has not eaten since he got of work yesterday evening at around midnight.   Cardiac Hx: HTN, cardiomyopathy, CHF, OSA, CKD, prolonged QT, hyperlipidemia  Current HTN meds:  Furosemide 40mg  BID - has been taking 80mg  at one time Lisinopril 20mg  daily QAM Carvedilol 25mg  BID - reports taking taking 2 tablets together once a day Amlodipine 10mg  daily QAM  Family History: He reports his mother is still living, but it recently came up that she does have some heart disease.   Social History: He quit smoking about 3 years ago - congratulated him on remaining tobacco free. Denies smokeless tobacco and reports rarely drinking with social occasions or holidays.  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   Diet: He eats most of his meals on the run; therefore, most of them are fast food. He never adds salt to his food. He does endorse drinking 1 bottle of soda per day; otherwise he is drinking mostly water.   Exercise: He just joined a gym and he plans to go on the weekends.   Home BP readings: has not been monitoring at home.   Wt Readings from Last 3 Encounters:  08/03/16 (!) 311  lb (141.1 kg)  07/04/16 (!) 312 lb (141.5 kg)  06/22/16 (!) 312 lb 3.2 oz (141.6 kg)   BP Readings from Last 3 Encounters:  10/19/16 (!) 142/108  08/03/16 (!) 139/94  06/22/16 (!) 157/113   Pulse Readings from Last 3 Encounters:  10/19/16 68  08/03/16 83  06/22/16 90    Renal function: CrCl cannot be calculated (Patient's most recent lab result is older than the maximum 21 days allowed.).  Past Medical History:  Diagnosis Date  . 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 Dilated aortic root (HCC)    seen on previous echo 02/2016  . Hyperlipidemia LDL goal <130 2015  . Hypertension   . Prediabetes     Current Outpatient Prescriptions on File Prior to Visit  Medication Sig Dispense Refill  . amLODipine (NORVASC) 10 MG tablet Take 1 tablet (10 mg total) by mouth daily. 90 tablet 3  . carvedilol (COREG) 25 MG tablet Take 1 tablet (25 mg total) by mouth 2 (two) times daily. 180 tablet 3  . furosemide (LASIX) 40 MG tablet Take 1 tablet (40 mg total) by mouth 2 (two) times daily. 180 tablet 1  .  potassium chloride (K-DUR) 10 MEQ tablet Take 2 tablets (20 mEq total) by mouth 2 (two) times daily. 270 tablet 1   No current facility-administered medications on file prior to visit.     No Known Allergies  Blood pressure (!) 142/108, pulse 68.   Assessment/Plan: Hypertension: BMET, lipid and hepatic panel today. Will increase his lisinopril to 40mg  daily. Have also asked that he take his BID medications twice daily, especially the carvedilol (once in the morning and once in the late afternoon when he is headed to work). We discussed the importance of taking medications as they are prescribed so that the medication is effective and safe. He will try to do this from now on. Follow up with Dr. Salena Saner in 2 weeks and pharmacy clinic as need for additional titration/medication management.    Thank you, Freddie ApleyKelley M. Cleatis PolkaAuten, PharmD    [facility]  10/19/2016 1:42 PM

## 2016-10-19 NOTE — Patient Instructions (Signed)
Return for a follow up appointment in 2 weeks with Dr. Salena Saner  Check your blood pressure at home daily (if able) and keep record of the readings.  Take your BP meds as follows: START taking carvedilol 25mg  TWICE daily INCREASE lisinopril to 40mg  daily (you make take 2 of your current supply until you run out then pick up higher strength from pharmacy and start 1 tablet daily) Continue all other medications as prescribed  Bring all of your meds, your BP cuff and your record of home blood pressures to your next appointment.  Exercise as you're able, try to walk approximately 30 minutes per day.  Keep salt intake to a minimum, especially watch canned and prepared boxed foods.  Eat more fresh fruits and vegetables and fewer canned items.  Avoid eating in fast food restaurants.    HOW TO TAKE YOUR BLOOD PRESSURE: . Rest 5 minutes before taking your blood pressure. .  Don't smoke or drink caffeinated beverages for at least 30 minutes before. . Take your blood pressure before (not after) you eat. . Sit comfortably with your back supported and both feet on the floor (don't cross your legs). . Elevate your arm to heart level on a table or a desk. . Use the proper sized cuff. It should fit smoothly and snugly around your bare upper arm. There should be enough room to slip a fingertip under the cuff. The bottom edge of the cuff should be 1 inch above the crease of the elbow. . Ideally, take 3 measurements at one sitting and record the average.

## 2016-10-19 NOTE — Progress Notes (Signed)
He did show up! MCr

## 2016-11-03 ENCOUNTER — Encounter: Payer: Self-pay | Admitting: *Deleted

## 2016-11-03 ENCOUNTER — Ambulatory Visit: Payer: BC Managed Care – PPO | Admitting: Cardiovascular Disease

## 2017-10-03 ENCOUNTER — Ambulatory Visit (INDEPENDENT_AMBULATORY_CARE_PROVIDER_SITE_OTHER): Payer: BC Managed Care – PPO | Admitting: Internal Medicine

## 2017-10-03 ENCOUNTER — Encounter: Payer: Self-pay | Admitting: Internal Medicine

## 2017-10-03 ENCOUNTER — Other Ambulatory Visit (INDEPENDENT_AMBULATORY_CARE_PROVIDER_SITE_OTHER): Payer: BC Managed Care – PPO

## 2017-10-03 VITALS — BP 156/100 | HR 99 | Temp 98.3°F | Resp 16 | Ht 73.0 in | Wt 312.0 lb

## 2017-10-03 DIAGNOSIS — R7303 Prediabetes: Secondary | ICD-10-CM | POA: Diagnosis not present

## 2017-10-03 DIAGNOSIS — I10 Essential (primary) hypertension: Secondary | ICD-10-CM

## 2017-10-03 DIAGNOSIS — E876 Hypokalemia: Secondary | ICD-10-CM

## 2017-10-03 DIAGNOSIS — N182 Chronic kidney disease, stage 2 (mild): Secondary | ICD-10-CM | POA: Diagnosis not present

## 2017-10-03 DIAGNOSIS — Z0000 Encounter for general adult medical examination without abnormal findings: Secondary | ICD-10-CM | POA: Diagnosis not present

## 2017-10-03 DIAGNOSIS — Z23 Encounter for immunization: Secondary | ICD-10-CM | POA: Diagnosis not present

## 2017-10-03 DIAGNOSIS — R9431 Abnormal electrocardiogram [ECG] [EKG]: Secondary | ICD-10-CM

## 2017-10-03 DIAGNOSIS — E785 Hyperlipidemia, unspecified: Secondary | ICD-10-CM

## 2017-10-03 DIAGNOSIS — T502X5A Adverse effect of carbonic-anhydrase inhibitors, benzothiadiazides and other diuretics, initial encounter: Secondary | ICD-10-CM

## 2017-10-03 LAB — CBC WITH DIFFERENTIAL/PLATELET
BASOS PCT: 1.9 % (ref 0.0–3.0)
Basophils Absolute: 0.1 10*3/uL (ref 0.0–0.1)
Eosinophils Absolute: 0.2 10*3/uL (ref 0.0–0.7)
Eosinophils Relative: 2.5 % (ref 0.0–5.0)
HEMATOCRIT: 45.1 % (ref 39.0–52.0)
Hemoglobin: 14.7 g/dL (ref 13.0–17.0)
LYMPHS PCT: 34.7 % (ref 12.0–46.0)
Lymphs Abs: 2.1 10*3/uL (ref 0.7–4.0)
MCHC: 32.5 g/dL (ref 30.0–36.0)
MCV: 84.1 fl (ref 78.0–100.0)
MONOS PCT: 8.4 % (ref 3.0–12.0)
Monocytes Absolute: 0.5 10*3/uL (ref 0.1–1.0)
NEUTROS ABS: 3.1 10*3/uL (ref 1.4–7.7)
Neutrophils Relative %: 52.5 % (ref 43.0–77.0)
PLATELETS: 322 10*3/uL (ref 150.0–400.0)
RBC: 5.37 Mil/uL (ref 4.22–5.81)
RDW: 14.7 % (ref 11.5–15.5)
WBC: 5.9 10*3/uL (ref 4.0–10.5)

## 2017-10-03 LAB — COMPREHENSIVE METABOLIC PANEL
ALT: 16 U/L (ref 0–53)
AST: 16 U/L (ref 0–37)
Albumin: 4 g/dL (ref 3.5–5.2)
Alkaline Phosphatase: 84 U/L (ref 39–117)
BUN: 15 mg/dL (ref 6–23)
CALCIUM: 9.4 mg/dL (ref 8.4–10.5)
CHLORIDE: 102 meq/L (ref 96–112)
CO2: 28 meq/L (ref 19–32)
CREATININE: 1.15 mg/dL (ref 0.40–1.50)
GFR: 92.62 mL/min (ref >60.00)
Glucose, Bld: 107 mg/dL — ABNORMAL HIGH (ref 70–99)
Potassium: 3.3 mEq/L — ABNORMAL LOW (ref 3.5–5.1)
Sodium: 139 mEq/L (ref 135–145)
Total Bilirubin: 0.5 mg/dL (ref 0.2–1.2)
Total Protein: 7.4 g/dL (ref 6.0–8.3)

## 2017-10-03 LAB — URINALYSIS, ROUTINE W REFLEX MICROSCOPIC
BILIRUBIN URINE: NEGATIVE
KETONES UR: NEGATIVE
LEUKOCYTES UA: NEGATIVE
Nitrite: NEGATIVE
Specific Gravity, Urine: 1.025 (ref 1.000–1.030)
TOTAL PROTEIN, URINE-UPE24: 100 — AB
URINE GLUCOSE: NEGATIVE
UROBILINOGEN UA: 0.2 (ref 0.0–1.0)
pH: 6.5 (ref 5.0–8.0)

## 2017-10-03 LAB — LIPID PANEL
CHOL/HDL RATIO: 6
Cholesterol: 239 mg/dL — ABNORMAL HIGH (ref 0–200)
HDL: 42.3 mg/dL (ref >39.00)
LDL CALC: 177 mg/dL — AB (ref 0–99)
NonHDL: 196.21
TRIGLYCERIDES: 94 mg/dL (ref 0.0–149.0)
VLDL: 18.8 mg/dL (ref 0.0–40.0)

## 2017-10-03 LAB — TSH: TSH: 1.34 u[IU]/mL (ref 0.35–4.50)

## 2017-10-03 LAB — HEMOGLOBIN A1C: Hgb A1c MFr Bld: 5.9 % (ref 4.6–6.5)

## 2017-10-03 MED ORDER — CARVEDILOL 25 MG PO TABS: 25.0000 mg | ORAL_TABLET | Freq: Two times a day (BID) | ORAL | 1 refills | Status: DC

## 2017-10-03 MED ORDER — CHLORTHALIDONE 25 MG PO TABS: 25.0000 mg | ORAL_TABLET | Freq: Every day | ORAL | 0 refills | Status: DC

## 2017-10-03 MED ORDER — POTASSIUM CHLORIDE ER 10 MEQ PO TBCR: 20.0000 meq | EXTENDED_RELEASE_TABLET | Freq: Two times a day (BID) | ORAL | 1 refills | Status: DC

## 2017-10-03 NOTE — Progress Notes (Signed)
Subjective:  Patient ID: Steve Carrillo, male    DOB: September 29, 1981  Age: 36 y.o. MRN: [id number]  CC: Hypertension (has not taken meds in about 2 to 3 days, he's out of medication---is hypertensive today but states he is asymptomatic)   HPI [name] presents for a BP check - he has a history of hypertension with LVH.  He has not recently been compliant with his antihypertensives.  Fortunately, he denies any recent episodes of headache/blurred vision/chest pain/shortness of breath/palpitations/edema/fatigue.  Outpatient Medications Prior to Visit  Medication Sig Dispense Refill  . amLODipine (NORVASC) 10 MG tablet Take 1 tablet (10 mg total) by mouth daily. 90 tablet 3  . carvedilol (COREG) 25 MG tablet Take 1 tablet (25 mg total) by mouth 2 (two) times daily. 180 tablet 3  . furosemide (LASIX) 40 MG tablet Take 1 tablet (40 mg total) by mouth 2 (two) times daily. 180 tablet 1  . potassium chloride (K-DUR) 10 MEQ tablet Take 2 tablets (20 mEq total) by mouth 2 (two) times daily. 270 tablet 1  . lisinopril (PRINIVIL,ZESTRIL) 40 MG tablet Take 1 tablet (40 mg total) by mouth daily. 30 tablet 11   No facility-administered medications prior to visit.     ROS Review of Systems  Constitutional: Negative.  Negative for diaphoresis, fatigue and unexpected weight change.  HENT: Negative.   Eyes: Negative for photophobia and visual disturbance.  Respiratory: Negative.  Negative for apnea, cough, chest tightness, shortness of breath and wheezing.   Cardiovascular: Negative.  Negative for chest pain, palpitations and leg swelling.  Gastrointestinal: Negative for abdominal pain, constipation, diarrhea, nausea and vomiting.  Endocrine: Negative.   Genitourinary: Negative.  Negative for dysuria, hematuria and urgency.  Musculoskeletal: Negative.  Negative for neck pain.  Skin: Negative.   Allergic/Immunologic: Negative.   Neurological: Negative.  Negative for dizziness, weakness, numbness and  headaches.  Hematological: Negative for adenopathy. Does not bruise/bleed easily.  Psychiatric/Behavioral: Negative.     Objective:  BP (!) 156/100   Pulse 99   Temp 98.3 F (36.8 C)   Resp 16   Wt (!) 312 lb (141.5 kg)   SpO2 99%   BMI 41.16 kg/m   BP Readings from Last 3 Encounters:  10/03/17 (!) 156/100  10/19/16 (!) 142/108  08/03/16 (!) 139/94    Wt Readings from Last 3 Encounters:  10/03/17 (!) 312 lb (141.5 kg)  08/03/16 (!) 311 lb (141.1 kg)  07/04/16 (!) 312 lb (141.5 kg)    Physical Exam  Constitutional: He is oriented to person, place, and time. No distress.  HENT:  Mouth/Throat: Oropharynx is clear and moist. No oropharyngeal exudate.  Eyes: Conjunctivae are normal. Right eye exhibits no discharge. Left eye exhibits no discharge. No scleral icterus.  Neck: Normal range of motion. Neck supple. No JVD present. No thyromegaly present.  Cardiovascular: Normal rate, regular rhythm and intact distal pulses.  Exam reveals no gallop and no friction rub.   No murmur heard. EKG--  Sinus  Rhythm  -Horizontal axis for age.   -Right atrial enlargement.   Voltage criteria for LVH  (S(V1)+R(V6) exceeds 3.50 mV).   -  Nonspecific T-abnormality.   ABNORMAL - no change from prior EKG  Pulmonary/Chest: Effort normal and breath sounds normal. No respiratory distress. He has no wheezes. He has no rales. He exhibits no tenderness.  Abdominal: Soft. Bowel sounds are normal. He exhibits no distension and no mass. There is no tenderness. There is no rebound and no  guarding.  Musculoskeletal: Normal range of motion. He exhibits no edema, tenderness or deformity.  Lymphadenopathy:    He has no cervical adenopathy.  Neurological: He is alert and oriented to person, place, and time.  Skin: Skin is warm and dry. No rash noted. He is not diaphoretic. No erythema. No pallor.  Vitals reviewed.   Lab Results  Component Value Date   WBC 5.9 10/03/2017   HGB 14.7 10/03/2017   HCT  45.1 10/03/2017   PLT 322.0 10/03/2017   GLUCOSE 107 (H) 10/03/2017   CHOL 239 (H) 10/03/2017   TRIG 94.0 10/03/2017   HDL 42.30 10/03/2017   LDLCALC 177 (H) 10/03/2017   ALT 16 10/03/2017   AST 16 10/03/2017   NA 139 10/03/2017   K 3.3 (L) 10/03/2017   CL 102 10/03/2017   CREATININE 1.15 10/03/2017   BUN 15 10/03/2017   CO2 28 10/03/2017   TSH 1.34 10/03/2017   INR 1.09 10/22/2014   HGBA1C 5.9 10/03/2017    Dg Chest 2 View  Result Date: 02/07/2016 CLINICAL DATA:  Acute onset of shortness of breath and chest pain. EXAM: CHEST  2 VIEW COMPARISON:  Radiograph 10/19/2014 FINDINGS: Cardiomegaly is unchanged. There is mild pulmonary edema. Minimal fluid in the fissure without large subpulmonic effusion. No confluent airspace disease. No pneumothorax. Osseous structures are intact. IMPRESSION: Stable cardiomegaly.  Mild pulmonary edema.  Suspect mild CHF. Electronically Signed   By: Rubye OaksMelanie  Ehinger M.D.   On: 02/07/2016 00:04    Assessment & Plan:   Tayron was seen today for hypertension.  Diagnoses and all orders for this visit:  Essential hypertension, malignant- his blood pressure is not well controlled due to noncompliance.  Will restart therapy with an alpha beta-blocker as well as a thiazide diuretic.  His EKG shows LVH but his EKG is unchanged compared to his prior EKGs and there is no evidence of fluid overload.  He has hematuria but his other labs are negative for any secondary causes or endorgan damage. I will also treat the hypokalemia. -     Comprehensive metabolic panel; Future -     CBC with Differential/Platelet; Future -     Urinalysis, Routine w reflex microscopic; Future -     carvedilol (COREG) 25 MG tablet; Take 1 tablet (25 mg total) by mouth 2 (two) times daily. -     chlorthalidone (HYGROTON) 25 MG tablet; Take 1 tablet (25 mg total) by mouth daily. -     EKG 12-Lead -     potassium chloride (K-DUR) 10 MEQ tablet; Take 2 tablets (20 mEq total) by mouth 2 (two)  times daily.  Chronic kidney disease, stage 2, mildly decreased GFR- his renal function is normal now. -     Comprehensive metabolic panel; Future -     CBC with Differential/Platelet; Future -     Urinalysis, Routine w reflex microscopic; Future  Hyperlipidemia with target LDL less than 130- statin tx is not indicated -     TSH; Future  Routine general medical examination at a health care facility- exam completed, labs reviewed, he refused a flu vaccine today but Tdap updated, patient education material was given. -     Lipid panel; Future  Prediabetes-his A1c is at 5.9%.  He agrees to improve his lifestyle modifications. -     Hemoglobin A1c; Future  Prolonged Q-T interval on ECG- the QT interval is normal on today's EKG.  Will avoid medications that may complicate this.  Need for Tdap  vaccination -     Tdap vaccine greater than or equal to 7yo IM  Diuretic-induced hypokalemia -     potassium chloride (K-DUR) 10 MEQ tablet; Take 2 tablets (20 mEq total) by mouth 2 (two) times daily.  Hypokalemia  Essential hypertension   I have discontinued Mr. Hensley furosemide, amLODipine, and lisinopril. I am also having him start on chlorthalidone. Additionally, I am having him maintain his carvedilol and potassium chloride.  Meds ordered this encounter  Medications  . carvedilol (COREG) 25 MG tablet    Sig: Take 1 tablet (25 mg total) by mouth 2 (two) times daily.    Dispense:  180 tablet    Refill:  1  . chlorthalidone (HYGROTON) 25 MG tablet    Sig: Take 1 tablet (25 mg total) by mouth daily.    Dispense:  90 tablet    Refill:  0  . potassium chloride (K-DUR) 10 MEQ tablet    Sig: Take 2 tablets (20 mEq total) by mouth 2 (two) times daily.    Dispense:  360 tablet    Refill:  1     Follow-up: Return in about 4 weeks (around 10/31/2017).  Sanda Linger, MD

## 2017-10-03 NOTE — Patient Instructions (Signed)

## 2017-10-04 ENCOUNTER — Encounter: Payer: Self-pay | Admitting: Internal Medicine

## 2017-10-11 ENCOUNTER — Encounter: Payer: BC Managed Care – PPO | Admitting: Internal Medicine

## 2018-02-19 ENCOUNTER — Other Ambulatory Visit: Payer: Self-pay | Admitting: Internal Medicine

## 2018-02-19 DIAGNOSIS — I10 Essential (primary) hypertension: Secondary | ICD-10-CM

## 2018-02-22 ENCOUNTER — Other Ambulatory Visit: Payer: Self-pay | Admitting: Internal Medicine

## 2018-02-22 DIAGNOSIS — I10 Essential (primary) hypertension: Secondary | ICD-10-CM

## 2018-06-14 ENCOUNTER — Other Ambulatory Visit: Payer: Self-pay | Admitting: Internal Medicine

## 2018-06-14 DIAGNOSIS — I10 Essential (primary) hypertension: Secondary | ICD-10-CM

## 2018-07-01 ENCOUNTER — Other Ambulatory Visit: Payer: Self-pay | Admitting: Internal Medicine

## 2018-07-01 DIAGNOSIS — I10 Essential (primary) hypertension: Secondary | ICD-10-CM

## 2018-10-21 ENCOUNTER — Other Ambulatory Visit: Payer: Self-pay | Admitting: Internal Medicine

## 2018-10-21 DIAGNOSIS — E876 Hypokalemia: Secondary | ICD-10-CM

## 2018-10-21 DIAGNOSIS — T502X5A Adverse effect of carbonic-anhydrase inhibitors, benzothiadiazides and other diuretics, initial encounter: Secondary | ICD-10-CM

## 2018-10-21 DIAGNOSIS — I10 Essential (primary) hypertension: Secondary | ICD-10-CM

## 2018-11-05 MED ORDER — POTASSIUM CHLORIDE ER 10 MEQ PO TBCR: 20.0000 meq | EXTENDED_RELEASE_TABLET | Freq: Two times a day (BID) | ORAL | 0 refills | Status: DC

## 2018-11-05 MED ORDER — CHLORTHALIDONE 25 MG PO TABS: 25.0000 mg | ORAL_TABLET | Freq: Every day | ORAL | 0 refills | Status: DC

## 2018-11-05 MED ORDER — CARVEDILOL 25 MG PO TABS: 25.0000 mg | ORAL_TABLET | Freq: Two times a day (BID) | ORAL | 0 refills | Status: DC

## 2018-11-05 NOTE — Addendum Note (Signed)
Addended by: Wilford CornerOVINGTON, Mylinda Brook W on: 11/05/2018 12:47 PM   Modules accepted: Orders

## 2018-11-05 NOTE — Telephone Encounter (Signed)
Pt called and made med refill appointment.  Pt wants to know if he can get enough to get him through to med refill OV.

## 2018-11-05 NOTE — Addendum Note (Signed)
Addended by: Radford PaxAIRRIKIER DAVIDSON, Toini Failla M on: 11/05/2018 02:41 PM   Modules accepted: Orders

## 2018-11-05 NOTE — Telephone Encounter (Signed)
Requested medication (s) are due for refill today: Yes  Requested medication (s) are on the active medication list: Yes  Last refill: Carvedilol 07/01/18; Chlorthalidone 02/19/18; Potassium 10/03/17 (expired)  Future visit scheduled: Yes  Notes to clinic: Requesting enough until his appointment      Requested Prescriptions  Pending Prescriptions Disp Refills   carvedilol (COREG) 25 MG tablet 60 tablet 0    Sig: Take 1 tablet (25 mg total) by mouth 2 (two) times daily.     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     Cardiovascular: Diuretics - Thiazide Failed - 11/05/2018 12:47 PM      Failed - Ca in normal range and within 360 days    Calcium  Date Value Ref Range Status  10/03/2017 9.4 8.4 - 10.5 mg/dL Final         Failed - Cr in normal range and within 360 days    Creat  Date Value Ref Range Status  10/19/2016 1.32 0.60 - 1.35 mg/dL Final   Creatinine, Ser  Date Value Ref Range Status  10/03/2017 1.15  0.40 - 1.50 mg/dL Final         Failed - K in normal range and within 360 days    Potassium  Date Value Ref Range Status  10/03/2017 3.3 (L) 3.5 - 5.1 mEq/L Final         Failed - Na in normal range and within 360 days    Sodium  Date Value Ref Range Status  10/03/2017 139 135 - 145 mEq/L Final         Failed - Last BP in normal range    BP Readings from Last 1 Encounters:  10/03/17 (!) 156/100         Failed - Valid encounter within last 6 months    Recent Outpatient Visits          1 year ago Essential hypertension, malignant   [facility] -Madelin Rear, MD   2 years ago Hypokalemia   [facility] -Madelin Rear, MD   3 years ago Acute upper respiratory infection   [facility] conservation officer at [employer],  [place], NP   4 years ago Routine general medical examination at a health care facility   [facility] -Madelin Rear, MD      Future Appointments            In 2 weeks Etta Grandchild, MD [facility] -[place], [facility]          potassium chloride (K-DUR) 10 MEQ tablet 360 tablet 1    Sig: Take 2 tablets (20 mEq total) by mouth 2 (two) times daily.     Endocrinology:  Minerals - Potassium Supplementation Failed - 11/05/2018 12:47 PM      Failed - K in normal range and within 360 days    Potassium  Date Value Ref Range Status  10/03/2017 3.3 (L) 3.5 - 5.1 mEq/L Final         Failed - Cr in normal range and within 360 days    Creat  Date Value Ref Range Status  10/19/2016 1.32 0.60 - 1.35 mg/dL Final   Creatinine, Ser  Date Value Ref Range Status  10/03/2017 1.15 0.40 - 1.50 mg/dL Final         Failed - Valid encounter within last 12 months    Recent Outpatient Visits          1 year ago Essential hypertension, malignant   [facility] -Madelin Rear, MD   2 years ago Hypokalemia   [facility] -Madelin Rear,  MD   3 years ago Acute upper respiratory infection   Nature conservation officer at [employer], [place], NP   4 years ago Routine general medical examination at a health care facility   [facility] -Madelin Rear, MD      Future Appointments            In 2 weeks Etta Grandchild, MD [facility] -[place], [facility]         Refused Prescriptions Disp Refills   carvedilol (COREG) 25 MG tablet [Pharmacy Med Name: CARVEDILOL 25MG      TAB] 60 tablet 0    Sig: TAKE 1 TABLET BY MOUTH TWICE DAILY     Cardiovascular:  Beta Blockers Failed - 11/05/2018 12:47 PM      Failed - Last BP in normal range    BP Readings from Last 1 Encounters:  10/03/17 (!) 156/100         Failed - Valid encounter within last 6 months    Recent Outpatient Visits          1 year ago Essential hypertension, malignant   [facility] -Madelin Rear, MD   2 years ago Hypokalemia   [facility] -Madelin Rear, MD   3 years ago Acute upper respiratory infection   Nature conservation officer at [employer], [place], NP   4 years ago Routine general medical examination at a health care facility   [facility] -Madelin Rear, MD      Future Appointments            In 2 weeks Etta Grandchild, MD [facility] -[place], [facility]           Passed - Last Heart Rate in normal range    Pulse Readings from Last 1 Encounters:  10/03/17 99        chlorthalidone (HYGROTON) 25 MG tablet [Pharmacy Med Name:  CHLORTHALIDONE 25MG     TAB] 90 tablet 0    Sig: TAKE 1 TABLET BY MOUTH ONCE DAILY     Cardiovascular: Diuretics - Thiazide Failed - 11/05/2018 12:47 PM      Failed - Ca in normal range and within 360 days    Calcium  Date Value Ref Range Status  10/03/2017 9.4 8.4 - 10.5 mg/dL Final         Failed - Cr in normal range and within 360 days    Creat  Date Value Ref Range Status  10/19/2016 1.32  0.60 - 1.35 mg/dL Final   Creatinine, Ser  Date Value Ref Range Status  10/03/2017 1.15 0.40 - 1.50 mg/dL Final         Failed - K in normal range and within 360 days    Potassium  Date Value Ref Range Status  10/03/2017 3.3 (L) 3.5 - 5.1 mEq/L Final         Failed - Na in normal range and within 360 days    Sodium  Date Value Ref Range Status  10/03/2017 139 135 - 145 mEq/L Final         Failed - Last BP in normal range    BP Readings from Last 1 Encounters:  10/03/17 (!) 156/100         Failed - Valid encounter within last 6 months    Recent Outpatient Visits          1 year ago Essential hypertension, malignant   [facility] -[place], Thomas L, MD   2 years ago Hypokalemia   [facility] -[place], Thomas L, MD   3 years ago Acute upper respiratory infection   [facility] [place], [place]   4 years ago Routine general medical examination at a health care facility   [facility] -[place], Thomas L, MD      Future Appointments            In 2 weeks Etta GrandchildJones, Thomas L, [facility]          potassium chloride (K-DUR) 10 MEQ tablet [Pharmacy Med Name: POTA CHLORIDE ER 10MEQ TAB] 360 tablet 1    Sig: TAKE 2 TABLETS BY MOUTH TWICE DAILY     Endocrinology:  Minerals - Potassium Supplementation Failed - 11/05/2018 12:47 PM      Failed - K in normal range and within 360 days    Potassium  Date Value Ref Range Status  10/03/2017 3.3 (L) 3.5 - 5.1 mEq/L Final         Failed - Cr in normal range and within 360 days    Creat  Date Value Ref Range Status  10/19/2016 1.32 0.60 - 1.35 mg/dL Final   Creatinine, Ser  Date Value Ref Range Status  10/03/2017 1.15 0.40 - 1.50 mg/dL Final         Failed - Valid encounter within last 12 months    Recent Outpatient Visits          1 year ago Essential hypertension, malignant   [facility] -[place] Jones, Thomas L, MD   2 years ago Hypokalemia   [facility] -[place] Jones, Thomas L, MD   3 years ago Acute upper respiratory infection   [facility] [place], NP   4 years ago Routine general medical examination at a health care facility  [facility] -Madelin Rear, MD      Future Appointments            In 2 weeks Etta Grandchild, MD [facility] -[place], [facility]

## 2018-11-11 ENCOUNTER — Encounter (HOSPITAL_COMMUNITY): Payer: Self-pay | Admitting: Emergency Medicine

## 2018-11-11 ENCOUNTER — Other Ambulatory Visit: Payer: Self-pay

## 2018-11-11 ENCOUNTER — Emergency Department (HOSPITAL_COMMUNITY): Payer: BC Managed Care – PPO

## 2018-11-11 DIAGNOSIS — Z87891 Personal history of nicotine dependence: Secondary | ICD-10-CM | POA: Insufficient documentation

## 2018-11-11 DIAGNOSIS — I130 Hypertensive heart and chronic kidney disease with heart failure and stage 1 through stage 4 chronic kidney disease, or unspecified chronic kidney disease: Secondary | ICD-10-CM | POA: Diagnosis not present

## 2018-11-11 DIAGNOSIS — Z79899 Other long term (current) drug therapy: Secondary | ICD-10-CM | POA: Diagnosis not present

## 2018-11-11 DIAGNOSIS — N182 Chronic kidney disease, stage 2 (mild): Secondary | ICD-10-CM | POA: Diagnosis not present

## 2018-11-11 DIAGNOSIS — R05 Cough: Secondary | ICD-10-CM | POA: Diagnosis present

## 2018-11-11 DIAGNOSIS — I509 Heart failure, unspecified: Secondary | ICD-10-CM | POA: Diagnosis not present

## 2018-11-11 MED ORDER — ALBUTEROL SULFATE (2.5 MG/3ML) 0.083% IN NEBU
5.0000 mg | INHALATION_SOLUTION | Freq: Once | RESPIRATORY_TRACT | Status: AC
  Administered 2018-11-11: 5 mg via RESPIRATORY_TRACT
  Filled 2018-11-11: qty 6

## 2018-11-11 NOTE — ED Triage Notes (Signed)
Pt reports having cough that has been ongoing for the last 3 days. Pt also reports congestion in chest and pain when coughing.

## 2018-11-12 ENCOUNTER — Other Ambulatory Visit: Payer: Self-pay

## 2018-11-12 ENCOUNTER — Emergency Department (HOSPITAL_COMMUNITY)
Admission: EM | Admit: 2018-11-12 | Discharge: 2018-11-12 | Disposition: A | Discharge status: Home / Self Care | Payer: BC Managed Care – PPO | Attending: Emergency Medicine | Admitting: Emergency Medicine

## 2018-11-12 DIAGNOSIS — I509 Heart failure, unspecified: Secondary | ICD-10-CM

## 2018-11-12 LAB — TROPONIN I
Troponin I: 0.03 ng/mL (ref <0.03)
Troponin I: 0.04 ng/mL (ref <0.03)

## 2018-11-12 LAB — CBC WITH DIFFERENTIAL/PLATELET
Abs Immature Granulocytes: 0.05 10*3/uL (ref 0.00–0.07)
Basophils Absolute: 0.1 10*3/uL (ref 0.0–0.1)
Basophils Relative: 1 %
EOS PCT: 6 %
Eosinophils Absolute: 0.5 10*3/uL (ref 0.0–0.5)
HCT: 42.6 % (ref 39.0–52.0)
Hemoglobin: 13.2 g/dL (ref 13.0–17.0)
Immature Granulocytes: 1 %
Lymphocytes Relative: 25 %
Lymphs Abs: 2.4 10*3/uL (ref 0.7–4.0)
MCH: 25.8 pg — ABNORMAL LOW (ref 26.0–34.0)
MCHC: 31 g/dL (ref 30.0–36.0)
MCV: 83.2 fL (ref 80.0–100.0)
Monocytes Absolute: 1 10*3/uL (ref 0.1–1.0)
Monocytes Relative: 10 %
NRBC: 0 % (ref 0.0–0.2)
Neutro Abs: 5.6 10*3/uL (ref 1.7–7.7)
Neutrophils Relative %: 57 %
Platelets: 328 10*3/uL (ref 150–400)
RBC: 5.12 MIL/uL (ref 4.22–5.81)
RDW: 15 % (ref 11.5–15.5)
WBC: 9.5 10*3/uL (ref 4.0–10.5)

## 2018-11-12 LAB — INFLUENZA PANEL BY PCR (TYPE A & B)
INFLBPCR: NEGATIVE
Influenza A By PCR: NEGATIVE

## 2018-11-12 LAB — BASIC METABOLIC PANEL
Anion gap: 12 (ref 5–15)
BUN: 22 mg/dL — ABNORMAL HIGH (ref 6–20)
CALCIUM: 9 mg/dL (ref 8.9–10.3)
CO2: 24 mmol/L (ref 22–32)
Chloride: 101 mmol/L (ref 98–111)
Creatinine, Ser: 1.51 mg/dL — ABNORMAL HIGH (ref 0.61–1.24)
GFR calc non Af Amer: 59 mL/min — ABNORMAL LOW (ref >60)
Glucose, Bld: 110 mg/dL — ABNORMAL HIGH (ref 70–99)
Potassium: 2.9 mmol/L — ABNORMAL LOW (ref 3.5–5.1)
Sodium: 137 mmol/L (ref 135–145)

## 2018-11-12 LAB — BRAIN NATRIURETIC PEPTIDE: B Natriuretic Peptide: 442.5 pg/mL — ABNORMAL HIGH (ref 0.0–100.0)

## 2018-11-12 MED ORDER — CARVEDILOL 25 MG PO TABS
25.0000 mg | ORAL_TABLET | Freq: Two times a day (BID) | ORAL | Status: DC
  Administered 2018-11-12: 25 mg via ORAL
  Filled 2018-11-12: qty 1

## 2018-11-12 MED ORDER — POTASSIUM CHLORIDE CRYS ER 20 MEQ PO TBCR
40.0000 meq | EXTENDED_RELEASE_TABLET | Freq: Once | ORAL | Status: AC
  Administered 2018-11-12: 40 meq via ORAL
  Filled 2018-11-12: qty 2

## 2018-11-12 MED ORDER — ACETAMINOPHEN 325 MG PO TABS
650.0000 mg | ORAL_TABLET | Freq: Once | ORAL | Status: AC
  Administered 2018-11-12: 650 mg via ORAL
  Filled 2018-11-12: qty 2

## 2018-11-12 MED ORDER — FUROSEMIDE 10 MG/ML IJ SOLN
40.0000 mg | Freq: Once | INTRAMUSCULAR | Status: AC
  Administered 2018-11-12: 40 mg via INTRAVENOUS
  Filled 2018-11-12: qty 4

## 2018-11-12 MED ORDER — POTASSIUM CHLORIDE 10 MEQ/100ML IV SOLN
10.0000 meq | Freq: Once | INTRAVENOUS | Status: AC
  Administered 2018-11-12: 10 meq via INTRAVENOUS
  Filled 2018-11-12: qty 100

## 2018-11-12 MED ORDER — ASPIRIN 81 MG PO CHEW
324.0000 mg | CHEWABLE_TABLET | Freq: Once | ORAL | Status: AC
  Administered 2018-11-12: 324 mg via ORAL
  Filled 2018-11-12: qty 4

## 2018-11-12 MED ORDER — IPRATROPIUM-ALBUTEROL 0.5-2.5 (3) MG/3ML IN SOLN
3.0000 mL | Freq: Once | RESPIRATORY_TRACT | Status: AC
  Administered 2018-11-12: 3 mL via RESPIRATORY_TRACT
  Filled 2018-11-12: qty 3

## 2018-11-12 NOTE — ED Provider Notes (Signed)
[facility]-EMERGENCY DEPT Provider Note   CSN: [id number] Arrival date & time: 11/11/18  2236     History   Chief Complaint Chief Complaint  Patient presents with  . Cough  . Shortness of Breath    HPI Steve Carrillo is a 37 y.o. male.  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 The cough has been mostly nonproductive, however he had some slightly blood tinged sputum one time. He reports right sided chest wall pain that is present only with coughing. He denies pain with inspiration or constant chest pain. Has had some sob when walking, but none currently. No le swelling or calf pain. Denies recent surgery/trauma, recent long travel, hormone use, personal hx of cancer, or hx of DVT/PE. No rhinorrhea, nasal congestion, sore throat, fevers, chills, HA or body aches. Denies sick contacts. Does not use tobacco. Uses marijuana. Does not vape. States he missed a few doses of his diuretic last week, but received a refill by his pcp a few days ago.  Reviewed records, previous left heart cath:  Normal coronary anatomy. Vessels are very large due to hypertensive cardiomyopathy. (2015)  Past Medical History:  Diagnosis Date  . 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 Dilated aortic root (HCC)    seen on previous echo 02/2016  . Hyperlipidemia LDL goal <130 2015  . Hypertension   . Prediabetes     Patient Active Problem List   Diagnosis Date Noted  . Morbid obesity (HCC) 08/03/2016  . Essential hypertension, malignant 06/22/2016  . Chronic kidney disease, stage 2, mildly decreased GFR 06/22/2016  . OSA (obstructive sleep apnea) 05/17/2016  . Diuretic-induced hypokalemia 03/14/2016  . Hyperlipidemia with target LDL less than 130 03/14/2016  . Snoring 03/14/2016  .  Prediabetes 02/20/2016  . Routine general medical examination at a health care facility 11/05/2014  . CHF (congestive heart failure) (HCC) 10/24/2014  . Cardiomyopathy (HCC) 10/21/2014  . Elevated serum creatinine 10/19/2014  . Prolonged Q-T interval on ECG 10/19/2014    Past Surgical History:  Procedure Laterality Date  . HERNIA REPAIR    . LEFT HEART CATHETERIZATION WITH CORONARY ANGIOGRAM N/A 10/22/2014   Procedure: LEFT HEART CATHETERIZATION WITH CORONARY ANGIOGRAM;  Surgeon: Peter M Swaziland, MD; Normal coronary anatomy. Vessels are very large due to hypertensive cardiomyopathy.         Home Medications    Prior to Admission medications   Medication Sig Start Date End Date Taking? Authorizing Provider  carvedilol (COREG) 25 MG tablet Take 1 tablet (25 mg total) by mouth 2 (two) times daily. 11/05/18  Yes Etta Grandchild, MD  chlorthalidone (HYGROTON) 25 MG tablet Take 1 tablet (25 mg total) by mouth daily. 11/05/18  Yes Etta Grandchild, MD  potassium chloride (K-DUR) 10 MEQ tablet Take 2 tablets (20 mEq total) by mouth 2 (two) times daily. 11/05/18  Yes Etta Grandchild, MD    Family History Family History  Problem Relation Age of Onset  . Diabetes Mellitus II Mother   . Hypertension Mother     Social History Social History   Tobacco Use  . Smoking status: Former Smoker    Last attempt to quit: 09/14/2014    Years since quitting: 4.1  . Smokeless tobacco:  Never Used  Substance Use Topics  . Alcohol use: Yes    Comment: Twice a month  . Drug use: No     Allergies   Patient has no known allergies.   Review of Systems Review of Systems  Constitutional: Negative for chills and fever.  HENT: Negative for congestion, rhinorrhea and sore throat.   Eyes: Negative for visual disturbance.  Respiratory: Positive for cough and shortness of breath. Negative for wheezing.   Cardiovascular: Positive for chest pain (with cough only). Negative for leg swelling.    Gastrointestinal: Negative for abdominal pain, constipation, diarrhea, nausea and vomiting.  Genitourinary: Negative for flank pain.  Musculoskeletal: Negative for gait problem.  Skin: Negative for rash.  Neurological: Negative for headaches.    Physical Exam Updated Vital Signs BP (!) 137/97   Pulse 86   Temp 98.8 F (37.1 C) (Oral)   Resp 18   Ht 6\' 1"  (1.854 m)   Wt 117.9 kg   SpO2 98%   BMI 34.30 kg/m   Physical Exam  Constitutional: He appears well-developed and well-nourished.  Non-toxic appearance. He does not appear ill. No distress.  HENT:  Head: Normocephalic and atraumatic.  Eyes: Conjunctivae are normal.  Neck: Neck supple.  Cardiovascular: Normal rate, regular rhythm and normal heart sounds.  No murmur heard. Pulmonary/Chest: Effort normal and breath sounds normal. No respiratory distress. He has no decreased breath sounds. He has no wheezes. He has no rhonchi. He has no rales.  Right sided chest wall TTP  Abdominal: Soft. There is no tenderness.  Musculoskeletal:       Right lower leg: Normal. He exhibits edema (trace). He exhibits no tenderness.       Left lower leg: Normal. He exhibits edema (trace). He exhibits no tenderness.  Neurological: He is alert.  Skin: Skin is warm and dry. Capillary refill takes less than 2 seconds.  Psychiatric: He has a normal mood and affect.  Nursing note and vitals reviewed.    ED Treatments / Results  Labs (all labs ordered are listed, but only abnormal results are displayed) Labs Reviewed  BRAIN NATRIURETIC PEPTIDE - Abnormal; Notable for the following components:      Result Value   B Natriuretic Peptide 442.5 (*)    All other components within normal limits  TROPONIN I - Abnormal; Notable for the following components:   Troponin I 0.03 (*)    All other components within normal limits  BASIC METABOLIC PANEL - Abnormal; Notable for the following components:   Potassium 2.9 (*)    Glucose, Bld 110 (*)    BUN 22  (*)    Creatinine, Ser 1.51 (*)    GFR calc non Af Amer 59 (*)    All other components within normal limits  CBC WITH DIFFERENTIAL/PLATELET - Abnormal; Notable for the following components:   MCH 25.8 (*)    All other components within normal limits  TROPONIN I - Abnormal; Notable for the following components:   Troponin I 0.04 (*)    All other components within normal limits  INFLUENZA PANEL BY PCR (TYPE A & B)    EKG EKG Interpretation  Date/Time:  Tuesday November 12 2018 09:07:34 EST Ventricular Rate:  82 PR Interval:    QRS Duration: 110 QT Interval:  428 QTC Calculation: 500 R Axis:   44 Text Interpretation:  Sinus rhythm Borderline prolonged PR interval Biatrial enlargement LVH with IVCD and secondary repol abnrm Borderline prolonged QT interval Confirmed by Geoffery Lyons (  [id number]) on 11/12/2018 9:40:59 AM   Radiology Dg Chest 2 View  Result Date: 11/11/2018 CLINICAL DATA:  37 year old male with shortness of breath and right-sided chest pain. EXAM: CHEST - 2 VIEW COMPARISON:  Chest radiograph dated 02/06/2016 FINDINGS: There is stable cardiomegaly with mild vascular congestion and interstitial edema. Trace right pleural effusion may be present. No focal consolidation, or pneumothorax. No acute osseous pathology. IMPRESSION: Cardiomegaly with mild vascular congestion and interstitial edema. No focal consolidation. Electronically Signed   By: Elgie Collard M.D.   On: 11/11/2018 23:03    Procedures Procedures (including critical care time)  Medications Ordered in ED Medications  carvedilol (COREG) tablet 25 mg (25 mg Oral Not Given 11/12/18 1220)  albuterol (PROVENTIL) (2.5 MG/3ML) 0.083% nebulizer solution 5 mg (5 mg Nebulization Given 11/11/18 2258)  acetaminophen (TYLENOL) tablet 650 mg (650 mg Oral Given 11/12/18 0625)  aspirin chewable tablet 324 mg (324 mg Oral Given 11/12/18 0746)  potassium chloride SA (K-DUR,KLOR-CON) CR tablet 40 mEq (40 mEq Oral Given 11/12/18  0746)  ipratropium-albuterol (DUONEB) 0.5-2.5 (3) MG/3ML nebulizer solution 3 mL (3 mLs Nebulization Given 11/12/18 1005)  furosemide (LASIX) injection 40 mg (40 mg Intravenous Given 11/12/18 1052)  potassium chloride 10 mEq in 100 mL IVPB (0 mEq Intravenous Stopped 11/12/18 1206)    Initial Impression / Assessment and Plan / ED Course  I have reviewed the triage vital signs and the nursing notes.  Pertinent labs & imaging results that were available during my care of the patient were reviewed by me and considered in my medical decision making (see chart for details).     Final Clinical Impressions(s) / ED Diagnoses   Final diagnoses:  Acute on chronic congestive heart failure, unspecified heart failure type (HCC)   Pt presenting with cough x3 days and some mild sob when walking that began today. Saturating well on RA. No tachypnea. Mild tachycardia and borderline febrile on arrival.   No risk factors for PE. Sxs seem most consistent with URI vs influenza like illness. Will check basic labs, flu panel, cxr, and EKG.  He received neb tx in triage and now lungs are clear without sob. Ambulated with pulse ox and sats maintained above 90%. ambulated without dyspnea.  CBC without leukocytosis or anemia BMP with hypokalemia at 2.9, elevated Bun/creatinine at 22/1.51 -- supplemented potassium with po kdur Trop elevated at 0.03, delta trop remains slightly elevated at 0.04 but grossly stable. BNP elevated at 442 Influenza test negative  CXR without infiltrates to suggest pneumonia.   EKG With sinus tachycardia. Biatrial enlargement, LVH With secondary repolarization abonrmality, prolonged QT and baseline wander leads in V2. No stemi or ischemic changes.  Clinically it appears that patient has some fluid overload likely from a mild acute heart failure exacerbation in setting of recently being off of his lasix. I suspect that is the reason for his elevated BUN, CXR findings and slightly  elevated troponing. I highly doubt ACS, pt has no CP and his sxs seem very atypical for this. Feel that he is safe for d/c with increased dosing of his lasix, adherence to low sodium diet, and close f/u with his pcp or cardiology. He is comfortable with this plan and is in agreement. Strict return precautions were given. Pt voices understanding of plan and reasons to return. All questions answered.    Pt was seen in conjunction with Dr. Judd Lien who evaluated the patient and recommended the above plan.   ED Discharge Orders    None  Karrie MeresCouture, Jamaree Hosier S, PA-C 11/12/18 1500    Geoffery Lyonselo, [place], MD 11/12/18 [phone]

## 2018-11-12 NOTE — Discharge Instructions (Addendum)
Take 80 mg lasix in the morning and 40 mg lasix in the evening for the next 4 days and follow up with your doctor in the next week.  Adhere to a low salt diet.  Return to the emergency room for any new or worsening symptoms in the meantime.

## 2018-11-12 NOTE — ED Notes (Signed)
Pt. Ambulated with no assist. Pt. Ambulated down the hallway and back with a O2 of 92-93%. Pt. Denies dizziness.

## 2018-11-21 ENCOUNTER — Ambulatory Visit: Payer: BC Managed Care – PPO | Admitting: Internal Medicine

## 2018-11-21 ENCOUNTER — Other Ambulatory Visit (INDEPENDENT_AMBULATORY_CARE_PROVIDER_SITE_OTHER): Payer: BC Managed Care – PPO

## 2018-11-21 ENCOUNTER — Encounter: Payer: Self-pay | Admitting: Internal Medicine

## 2018-11-21 VITALS — BP 160/120 | HR 86 | Temp 98.3°F | Resp 16 | Ht 73.0 in | Wt 311.2 lb

## 2018-11-21 DIAGNOSIS — E785 Hyperlipidemia, unspecified: Secondary | ICD-10-CM | POA: Diagnosis not present

## 2018-11-21 DIAGNOSIS — N182 Chronic kidney disease, stage 2 (mild): Secondary | ICD-10-CM

## 2018-11-21 DIAGNOSIS — T502X5A Adverse effect of carbonic-anhydrase inhibitors, benzothiadiazides and other diuretics, initial encounter: Secondary | ICD-10-CM

## 2018-11-21 DIAGNOSIS — I5022 Chronic systolic (congestive) heart failure: Secondary | ICD-10-CM

## 2018-11-21 DIAGNOSIS — I10 Essential (primary) hypertension: Secondary | ICD-10-CM | POA: Diagnosis not present

## 2018-11-21 DIAGNOSIS — Z0000 Encounter for general adult medical examination without abnormal findings: Secondary | ICD-10-CM

## 2018-11-21 DIAGNOSIS — R7989 Other specified abnormal findings of blood chemistry: Secondary | ICD-10-CM

## 2018-11-21 DIAGNOSIS — R7303 Prediabetes: Secondary | ICD-10-CM

## 2018-11-21 DIAGNOSIS — I429 Cardiomyopathy, unspecified: Secondary | ICD-10-CM

## 2018-11-21 DIAGNOSIS — E876 Hypokalemia: Secondary | ICD-10-CM

## 2018-11-21 LAB — HEMOGLOBIN A1C: Hgb A1c MFr Bld: 6.2 % (ref 4.6–6.5)

## 2018-11-21 LAB — BASIC METABOLIC PANEL
BUN: 22 mg/dL (ref 6–23)
CO2: 27 mEq/L (ref 19–32)
Calcium: 9.6 mg/dL (ref 8.4–10.5)
Chloride: 101 mEq/L (ref 96–112)
Creatinine, Ser: 1.63 mg/dL — ABNORMAL HIGH (ref 0.40–1.50)
GFR: 61.54 mL/min (ref >60.00)
Glucose, Bld: 105 mg/dL — ABNORMAL HIGH (ref 70–99)
POTASSIUM: 3.6 meq/L (ref 3.5–5.1)
Sodium: 139 mEq/L (ref 135–145)

## 2018-11-21 LAB — LIPID PANEL
Cholesterol: 244 mg/dL — ABNORMAL HIGH (ref 0–200)
HDL: 40.2 mg/dL (ref >39.00)
LDL Cholesterol: 182 mg/dL — ABNORMAL HIGH (ref 0–99)
NonHDL: 203.39
Total CHOL/HDL Ratio: 6
Triglycerides: 108 mg/dL (ref 0.0–149.0)
VLDL: 21.6 mg/dL (ref 0.0–40.0)

## 2018-11-21 LAB — TSH: TSH: 1.12 u[IU]/mL (ref 0.35–4.50)

## 2018-11-21 LAB — BRAIN NATRIURETIC PEPTIDE: Pro B Natriuretic peptide (BNP): 370 pg/mL — ABNORMAL HIGH (ref 0.0–100.0)

## 2018-11-21 LAB — MAGNESIUM: Magnesium: 2.2 mg/dL (ref 1.5–2.5)

## 2018-11-21 MED ORDER — SPIRONOLACTONE 25 MG PO TABS: 25.0000 mg | ORAL_TABLET | Freq: Every day | ORAL | 0 refills | Status: DC

## 2018-11-21 MED ORDER — CARVEDILOL 25 MG PO TABS: 25.0000 mg | ORAL_TABLET | Freq: Two times a day (BID) | ORAL | 0 refills | Status: DC

## 2018-11-21 NOTE — Progress Notes (Signed)
Subjective:  Patient ID: Steve Carrillo, male    DOB: 1981-02-02  Age: 37 y.o. MRN: [id number]  CC: Hypertension and Congestive Heart Failure   HPI Steve Carrillo presents for f/up - He was recently seen in the ED for mild fluid overload and was found to have a low potassium level.  He seems to be unaware that he has a history of heart failure.  For the most part, he is noncompliant with his medications.  He denies any recent episodes of edema, CP, DOE, palpitations, edema, or fatigue.  Outpatient Medications Prior to Visit  Medication Sig Dispense Refill  . potassium chloride (K-DUR) 10 MEQ tablet Take 2 tablets (20 mEq total) by mouth 2 (two) times daily. 120 tablet 0  . carvedilol (COREG) 25 MG tablet Take 1 tablet (25 mg total) by mouth 2 (two) times daily. 60 tablet 0  . chlorthalidone (HYGROTON) 25 MG tablet Take 1 tablet (25 mg total) by mouth daily. 30 tablet 0   No facility-administered medications prior to visit.     ROS Review of Systems  Constitutional: Negative for appetite change, diaphoresis, fatigue and unexpected weight change.  HENT: Negative.   Eyes: Negative for visual disturbance.  Respiratory: Negative for cough, chest tightness, shortness of breath and wheezing.   Cardiovascular: Negative for chest pain, palpitations and leg swelling.  Gastrointestinal: Negative for abdominal pain, constipation, diarrhea, nausea and vomiting.  Genitourinary: Negative.  Negative for difficulty urinating.  Musculoskeletal: Negative for arthralgias and myalgias.  Skin: Negative.  Negative for color change.  Neurological: Negative for dizziness, syncope, weakness and light-headedness.  Hematological: Negative for adenopathy. Does not bruise/bleed easily.  Psychiatric/Behavioral: Negative.     Objective:  BP (!) 160/120 (BP Location: Left Arm, Patient Position: Sitting, Cuff Size: Large)   Pulse 86   Temp 98.3 F (36.8 C) (Oral)   Resp 16   Ht 6\' 1"  (1.854 m)   Wt (!) 311  lb 4 oz (141.2 kg)   SpO2 98%   BMI 41.06 kg/m   BP Readings from Last 3 Encounters:  11/21/18 (!) 160/120  11/12/18 (!) 137/97  10/03/17 (!) 156/100    Wt Readings from Last 3 Encounters:  11/21/18 (!) 311 lb 4 oz (141.2 kg)  11/11/18 260 lb (117.9 kg)  10/03/17 (!) 312 lb (141.5 kg)    Physical Exam Vitals signs reviewed.  Constitutional:      Appearance: He is obese.  HENT:     Nose: Nose normal.     Mouth/Throat:     Mouth: Mucous membranes are moist.  Eyes:     Conjunctiva/sclera: Conjunctivae normal.  Neck:     Musculoskeletal: Normal range of motion and neck supple. No muscular tenderness.  Cardiovascular:     Rate and Rhythm: Normal rate and regular rhythm.     Heart sounds: No murmur. No gallop.      Comments: EKG ---  Sinus rhythm Voltage criteria for LVH  (R(V6) exceeds 2.26 mV)  -Nonspecific QRS widening.   ABNORMAL - no changes compared to the prior EKG  Pulmonary:     Effort: Pulmonary effort is normal.     Breath sounds: Normal breath sounds. No stridor. No wheezing, rhonchi or rales.  Abdominal:     General: Abdomen is flat. Bowel sounds are normal.     Palpations: There is no hepatomegaly, splenomegaly or mass.     Tenderness: There is no abdominal tenderness.  Musculoskeletal: Normal range of motion.  General: No swelling.     Right lower leg: No edema.     Left lower leg: No edema.  Skin:    General: Skin is warm and dry.     Coloration: Skin is not pale.     Findings: No rash.  Neurological:     General: No focal deficit present.     Mental Status: He is oriented to person, place, and time. Mental status is at baseline.     Lab Results  Component Value Date   WBC 9.5 11/12/2018   HGB 13.2 11/12/2018   HCT 42.6 11/12/2018   PLT 328 11/12/2018   GLUCOSE 105 (H) 11/21/2018   CHOL 244 (H) 11/21/2018   TRIG 108.0 11/21/2018   HDL 40.20 11/21/2018   LDLCALC 182 (H) 11/21/2018   ALT 16 10/03/2017   AST 16 10/03/2017   NA 139  11/21/2018   K 3.6 11/21/2018   CL 101 11/21/2018   CREATININE 1.63 (H) 11/21/2018   BUN 22 11/21/2018   CO2 27 11/21/2018   TSH 1.12 11/21/2018   INR 1.09 10/22/2014   HGBA1C 6.2 11/21/2018    No results found.  Assessment & Plan:   Steve Carrillo was seen today for hypertension and congestive heart failure.  Diagnoses and all orders for this visit:  Essential hypertension, malignant - His blood pressure is not adequately well controlled but he recently had an episode of rather severe hypokalemia.  I have asked him to change from a thiazide diuretic to a potassium sparing diuretic.  Will continue carvedilol at the current dose and I have asked him to start [facility]. -     EKG 12-Lead -     Basic metabolic panel; Future -     spironolactone (ALDACTONE) 25 MG tablet; Take 1 tablet (25 mg total) by mouth daily. -     carvedilol (COREG) 25 MG tablet; Take 1 tablet (25 mg total) by mouth 2 (two) times daily.  Diuretic-induced hypokalemia- He was recently seen in the ED and had a potassium level down to 2.9.  I have therefore asked him to stop taking the thiazide diuretic and to switch to a potassium sparing diuretic. -     Basic metabolic panel; Future -     Magnesium; Future -     spironolactone (ALDACTONE) 25 MG tablet; Take 1 tablet (25 mg total) by mouth daily.  Chronic systolic congestive heart failure (HCC)- I have asked him to see cardiology for follow-up on his systolic heart failure.  I have recommended that he start the sacubitril/valsartan combination as well. -     Brain natriuretic peptide; Future -     spironolactone (ALDACTONE) 25 MG tablet; Take 1 tablet (25 mg total) by mouth daily. -     Ambulatory referral to Cardiology -     carvedilol (COREG) 25 MG tablet; Take 1 tablet (25 mg total) by mouth 2 (two) times daily.  Chronic kidney disease, stage 2, mildly decreased GFR- His renal function is normal.  Hyperlipidemia with target LDL less than 130- His Framingham risk was  only 5% so I do not recommend a statin for CV risk reduction. -     TSH; Future  Prediabetes- His A1c is at 6.2%.  Medical therapy is not indicated.  He was encouraged to improve his lifestyle modifications. -     Hemoglobin A1c; Future  Cardiomyopathy, unspecified type (HCC) -     Ambulatory referral to Cardiology -     carvedilol (COREG) 25 MG  tablet; Take 1 tablet (25 mg total) by mouth 2 (two) times daily.  Routine general medical examination at a health care facility- Exam completed, labs reviewed, he refused a flu vaccine, patient education material was given. -     Lipid panel; Future -     HIV Antibody (routine testing w rflx); Future   I have discontinued Steve Carrillo's chlorthalidone. I am also having him start on spironolactone and sacubitril-valsartan. Additionally, I am having him maintain his potassium chloride and carvedilol.  Meds ordered this encounter  Medications  . spironolactone (ALDACTONE) 25 MG tablet    Sig: Take 1 tablet (25 mg total) by mouth daily.    Dispense:  30 tablet    Refill:  0  . carvedilol (COREG) 25 MG tablet    Sig: Take 1 tablet (25 mg total) by mouth 2 (two) times daily.    Dispense:  60 tablet    Refill:  0  . sacubitril-valsartan (ENTRESTO) 49-51 MG    Sig: Take 1 tablet by mouth 2 (two) times daily.    Dispense:  60 tablet    Refill:  1     Follow-up: Return in about 4 weeks (around 12/19/2018).  Sanda Lingerhomas Rashan Rounsaville, MD

## 2018-11-21 NOTE — Patient Instructions (Signed)

## 2018-11-22 LAB — HIV ANTIBODY (ROUTINE TESTING W REFLEX): HIV 1&2 Ab, 4th Generation: NONREACTIVE

## 2018-11-24 MED ORDER — SACUBITRIL-VALSARTAN 49-51 MG PO TABS: 1.0000 | ORAL_TABLET | Freq: Two times a day (BID) | ORAL | 1 refills | Status: DC

## 2018-11-25 ENCOUNTER — Telehealth: Payer: Self-pay

## 2018-11-25 NOTE — Telephone Encounter (Signed)
Key: [id number]

## 2018-11-26 NOTE — Telephone Encounter (Signed)
Written letter to appeal denial. Pt does have EF of 40%. Letter has been faxed to [phone]

## 2018-12-09 NOTE — Telephone Encounter (Signed)
LVM for pt to call back as soon as possible.   

## 2018-12-09 NOTE — Telephone Encounter (Signed)
I have an appeal form to be signed by patient giving consent. I also have samples for patient.

## 2018-12-09 NOTE — Telephone Encounter (Signed)
Pt called back and stated he would be in tomorrow to take care of that. The form is up front

## 2018-12-10 NOTE — Telephone Encounter (Signed)
Pt came in and signed forms. Forms have been faxed back to [facility].

## 2018-12-12 ENCOUNTER — Encounter: Payer: Self-pay | Admitting: Cardiovascular Disease

## 2018-12-12 ENCOUNTER — Ambulatory Visit (INDEPENDENT_AMBULATORY_CARE_PROVIDER_SITE_OTHER): Payer: BC Managed Care – PPO | Admitting: Cardiovascular Disease

## 2018-12-12 VITALS — BP 126/88 | HR 82 | Ht 73.0 in | Wt 312.0 lb

## 2018-12-12 DIAGNOSIS — N182 Chronic kidney disease, stage 2 (mild): Secondary | ICD-10-CM

## 2018-12-12 DIAGNOSIS — I10 Essential (primary) hypertension: Secondary | ICD-10-CM

## 2018-12-12 DIAGNOSIS — E785 Hyperlipidemia, unspecified: Secondary | ICD-10-CM | POA: Diagnosis not present

## 2018-12-12 DIAGNOSIS — I5022 Chronic systolic (congestive) heart failure: Secondary | ICD-10-CM | POA: Diagnosis not present

## 2018-12-12 DIAGNOSIS — Z79899 Other long term (current) drug therapy: Secondary | ICD-10-CM

## 2018-12-12 MED ORDER — ATORVASTATIN CALCIUM 40 MG PO TABS: 40.0000 mg | ORAL_TABLET | Freq: Every day | ORAL | 3 refills | Status: DC

## 2018-12-12 NOTE — Patient Instructions (Signed)
Medication Instructions:  Dr Royann Shivers has recommended making the following medication changes: 1. START Atorvastatin 40 mg - take 1 tablet daily  If you need a refill on your cardiac medications before your next appointment, please call your pharmacy.   Lab work: Your physician recommends that you return for lab work in 3 months - FASTING.  If you have labs (blood work) drawn today and your tests are completely normal, you will receive your results only by: Marland Kitchen MyChart Message (if you have MyChart) OR . A paper copy in the mail If you have any lab test that is abnormal or we need to change your treatment, we will call you to review the results.  Follow-Up: At [facility], you and your health needs are our priority.  As part of our continuing mission to provide you with exceptional heart care, we have created designated Provider Care Teams.  These Care Teams include your primary Cardiologist (physician) and Advanced Practice Providers (APPs -  Physician Assistants and Nurse Practitioners) who all work together to provide you with the care you need, when you need it. You will need a follow up appointment in 12 months.You may see Thurmon Fair, MD or one of the following Advanced Practice Providers on your designated Care Team: [place], [place] . Micah Flesher, PA-C . You will receive a reminder letter in the mail two months in advance. If you don't receive a letter, please call our office to schedule the follow-up appointment.

## 2018-12-12 NOTE — Progress Notes (Signed)
Cardiology Office Note    Date:  12/12/2018   ID:  Steve Carrillo, DOB [birth year], MRN [id number]  PCP:  Etta Grandchild, MD  Cardiologist:   Thurmon Fair, MD   No chief complaint on file.   History of Present Illness:  Steve Carrillo is a 38 y.o. male with severe systemic hypertension, hypertensive heart disease (left ventricular hypertrophy and moderately depressed left ventricular systolic function) with compensated heart failure, mild chronic kidney disease, mildly dilated aortic root, hyperlipidemia, obesity and prediabetes.   This is a first appointment since 2017.  Ran out of his medications and was seen in the emergency room on December 10 with mild heart failure exacerbation.  He improved quickly with intravenous Lasix.  Has restarted treatment with Entresto, carvedilol and spironolactone.  He is not currently taking a loop diuretic.  He is returned to a fairly physical job without complaints of exertional dyspnea or dyspnea at rest, has not had leg edema, orthopnea, PND, chest pain, syncope or palpitations.  Labs performed by his PCP on December 19 showed a potassium of 3.6, creatinine 1.6 (not far from his baseline), improved proBNP at 370 (down from BNP of 442 a few days earlier in the ED).  His lipid profile continues to show severe hypercholesterolemia with an LDL around 180.  He remains morbidly obese.  His echocardiogram in 2017 showed an ejection fraction of 35-40%.  Catheterization did not show any evidence of coronary disease in 2015.  Sleep study showed very mild obstructive sleep apnea and CPAP was not recommended.   Past Medical History:  Diagnosis Date  . 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 Dilated aortic root (HCC)    seen on previous echo 02/2016  . Hyperlipidemia LDL goal <130 2015  . Hypertension   . Prediabetes     Past Surgical History:  Procedure Laterality Date  . HERNIA  REPAIR    . LEFT HEART CATHETERIZATION WITH CORONARY ANGIOGRAM N/A 10/22/2014   Procedure: LEFT HEART CATHETERIZATION WITH CORONARY ANGIOGRAM;  Surgeon: Peter M Swaziland, MD; Normal coronary anatomy. Vessels are very large due to hypertensive cardiomyopathy.     Current Medications: Outpatient Medications Prior to Visit  Medication Sig Dispense Refill  . carvedilol (COREG) 25 MG tablet Take 1 tablet (25 mg total) by mouth 2 (two) times daily. 60 tablet 0  . sacubitril-valsartan (ENTRESTO) 49-51 MG Take 1 tablet by mouth 2 (two) times daily. 60 tablet 1  . spironolactone (ALDACTONE) 25 MG tablet Take 1 tablet (25 mg total) by mouth daily. 30 tablet 0  . potassium chloride (K-DUR) 10 MEQ tablet Take 2 tablets (20 mEq total) by mouth 2 (two) times daily. 120 tablet 0   No facility-administered medications prior to visit.      Allergies:   Patient has no known allergies.   Social History   Socioeconomic History  . Marital status: Single    Spouse name: Not on file  . Number of children: Not on file  . Years of education: Not on file  . Highest education level: Not on file  Occupational History  . Occupation: Mining engineer: [employer]  Social Needs  . Financial resource strain: Not on file  . Food insecurity:    Worry: Not on file    Inability: Not on file  . Transportation needs:    Medical: Not on file    Non-medical:  Not on file  Tobacco Use  . Smoking status: Former Smoker    Last attempt to quit: 09/14/2014    Years since quitting: 4.2  . Smokeless tobacco: Never Used  Substance and Sexual Activity  . Alcohol use: Yes    Comment: Twice a month  . Drug use: No  . Sexual activity: Not on file  Lifestyle  . Physical activity:    Days per week: Not on file    Minutes per session: Not on file  . Stress: Not on file  Relationships  . Social connections:    Talks on phone: Not on file    Gets together: Not on file    Attends religious service: Not  on file    Active member of club or organization: Not on file    Attends meetings of clubs or organizations: Not on file    Relationship status: Not on file  Other Topics Concern  . Not on file  Social History Narrative   Lives with girlfriend.      Family History:  The patient's family history includes Diabetes Mellitus II in his maternal grandmother and mother; Hypertension in his mother.   ROS:   Please see the history of present illness.    ROS other systems are reviewed and are negative   PHYSICAL EXAM:   VS:  BP 126/88   Pulse 82   Ht 6\' 1"  (1.854 m)   Wt (!) 312 lb (141.5 kg)   BMI 41.16 kg/m     General: Alert, oriented x3, no distress, morbidly obese, looks very comfortable Head: no evidence of trauma, PERRL, EOMI, no exophtalmos or lid lag, no myxedema, no xanthelasma; normal ears, nose and oropharynx Neck: normal jugular venous pulsations and no hepatojugular reflux; brisk carotid pulses without delay and no carotid bruits Chest: clear to auscultation, no signs of consolidation by percussion or palpation, normal fremitus, symmetrical and full respiratory excursions Cardiovascular: normal position and quality of the apical impulse, regular rhythm, normal first and second heart sounds, no murmurs, rubs or gallops Abdomen: no tenderness or distention, no masses by palpation, no abnormal pulsatility or arterial bruits, normal bowel sounds, no hepatosplenomegaly Extremities: no clubbing, cyanosis or edema; 2+ radial, ulnar and brachial pulses bilaterally; 2+ right femoral, posterior tibial and dorsalis pedis pulses; 2+ left femoral, posterior tibial and dorsalis pedis pulses; no subclavian or femoral bruits Neurological: grossly nonfocal Psych: Normal mood and affect   Wt Readings from Last 3 Encounters:  12/12/18 (!) 312 lb (141.5 kg)  11/21/18 (!) 311 lb 4 oz (141.2 kg)  11/11/18 260 lb (117.9 kg)      Studies/Labs Reviewed:   EKG:  EKG is ordered today.  Shows  sinus rhythm, biatrial abnormality, left ventricular hypertrophy with broadened QRS and secondary repolarization changes, QTC 511 ms  Recent Labs: 11/12/2018: B Natriuretic Peptide 442.5; Hemoglobin 13.2; Platelets 328 11/21/2018: BUN 22; Creatinine, Ser 1.63; Magnesium 2.2; Potassium 3.6; Pro B Natriuretic peptide (BNP) 370.0; Sodium 139; TSH 1.12   Lipid Panel    Component Value Date/Time   CHOL 244 (H) 11/21/2018 1255   TRIG 108.0 11/21/2018 1255   HDL 40.20 11/21/2018 1255   CHOLHDL 6 11/21/2018 1255   VLDL 21.6 11/21/2018 1255   LDLCALC 182 (H) 11/21/2018 1255    ASSESSMENT:    1. Chronic systolic congestive heart failure (HCC)   2. Hyperlipidemia with target LDL less than 130   3. Malignant hypertension   4. CKD (chronic kidney disease) stage 2,  GFR 60-89 ml/min   5. Morbid obesity (HCC)   6. Medication management      PLAN:  In order of problems listed above:  1. CHF: He appears to be clinically euvolemic despite not taking the loop diuretic.  Restarted Entresto, carvedilol and spironolactone about a month ago.  After at least 3 months of consistent therapy will need a repeat echocardiogram. 2. Malignant hypertension: Fair control.  Ideally will bring his diastolic blood pressure less than 80. 3. CKD: Stable renal parameters compared to his baseline but needs periodic labs primarily to monitor for hyperkalemia while taking Entresto and spironolactone without loop diuretics.  Creatinine may appear worse than his degree of renal dysfunction since he is a large gentleman with lots of muscle as well as obesity. 4. HLP: Severe hypercholesterolemia.  Start a statin and recheck labs in a few months.  We will recheck renal parameters and potassium level with those labs. 5. Morbid obesity: Study did not show enough evidence of apnea to justify CPAP.  Strongly encouraged to follow-up calorie restricted diet and lose weight, he also needs to start exercising regularly by  walking    Medication Adjustments/Labs and Tests Ordered: Current medicines are reviewed at length with the patient today.  Concerns regarding medicines are outlined above.  Medication changes, Labs and Tests ordered today are listed in the Patient Instructions below. Patient Instructions  Medication Instructions:  Dr Royann Shiversroitoru has recommended making the following medication changes: 1. START Atorvastatin 40 mg - take 1 tablet daily  If you need a refill on your cardiac medications before your next appointment, please call your pharmacy.   Lab work: Your physician recommends that you return for lab work in 3 months - FASTING.  If you have labs (blood work) drawn today and your tests are completely normal, you will receive your results only by: Marland Kitchen. MyChart Message (if you have MyChart) OR . A paper copy in the mail If you have any lab test that is abnormal or we need to change your treatment, we will call you to review the results.  Follow-Up: At [facility], you and your health needs are our priority.  As part of our continuing mission to provide you with exceptional heart care, we have created designated Provider Care Teams.  These Care Teams include your primary Cardiologist (physician) and Advanced Practice Providers (APPs -  Physician Assistants and Nurse Practitioners) who all work together to provide you with the care you need, when you need it. You will need a follow up appointment in 12 months.You may see Thurmon FairMihai Chasen Mendell, MD or one of the following Advanced Practice Providers on your designated Care Team: [place], [place] . Micah FlesherAngela Duke, PA-C . You will receive a reminder letter in the mail two months in advance. If you don't receive a letter, please call our office to schedule the follow-up appointment.    Signed, Thurmon FairMihai Tationa Stech, MD  12/12/2018 5:59 PM    [facility] [address], [place], [place]  [address] Phone: [phone]; Fax: [phone]

## 2018-12-19 ENCOUNTER — Other Ambulatory Visit (INDEPENDENT_AMBULATORY_CARE_PROVIDER_SITE_OTHER): Payer: BC Managed Care – PPO

## 2018-12-19 ENCOUNTER — Ambulatory Visit (INDEPENDENT_AMBULATORY_CARE_PROVIDER_SITE_OTHER): Payer: BC Managed Care – PPO | Admitting: Internal Medicine

## 2018-12-19 ENCOUNTER — Encounter: Payer: Self-pay | Admitting: Internal Medicine

## 2018-12-19 VITALS — BP 122/82 | HR 83 | Temp 99.0°F | Ht 73.0 in | Wt 315.0 lb

## 2018-12-19 DIAGNOSIS — E876 Hypokalemia: Secondary | ICD-10-CM

## 2018-12-19 DIAGNOSIS — I10 Essential (primary) hypertension: Secondary | ICD-10-CM | POA: Diagnosis not present

## 2018-12-19 DIAGNOSIS — T502X5A Adverse effect of carbonic-anhydrase inhibitors, benzothiadiazides and other diuretics, initial encounter: Secondary | ICD-10-CM

## 2018-12-19 DIAGNOSIS — Z23 Encounter for immunization: Secondary | ICD-10-CM

## 2018-12-19 LAB — BASIC METABOLIC PANEL
BUN: 19 mg/dL (ref 6–23)
CO2: 25 mEq/L (ref 19–32)
Calcium: 9.4 mg/dL (ref 8.4–10.5)
Chloride: 106 mEq/L (ref 96–112)
Creatinine, Ser: 1.62 mg/dL — ABNORMAL HIGH (ref 0.40–1.50)
GFR: 61.95 mL/min (ref >60.00)
Glucose, Bld: 83 mg/dL (ref 70–99)
Potassium: 3.9 mEq/L (ref 3.5–5.1)
Sodium: 138 mEq/L (ref 135–145)

## 2018-12-19 NOTE — Patient Instructions (Signed)

## 2018-12-19 NOTE — Progress Notes (Signed)
Subjective:  Patient ID: Steve Carrillo, male    DOB: 06/19/1981  Age: 38 y.o. MRN: [id number]  CC: Hypertension and Congestive Heart Failure   HPI Whyatt J Goetting presents for f/up - He complains of weight gain but otherwise feels well and offers no other complaints.  He tells me his blood pressure has been well controlled and he denies any recent episodes of dizziness, lightheadedness, chest pain, shortness of breath, or edema.  Outpatient Medications Prior to Visit  Medication Sig Dispense Refill  . atorvastatin (LIPITOR) 40 MG tablet Take 1 tablet (40 mg total) by mouth daily. 90 tablet 3  . carvedilol (COREG) 25 MG tablet Take 1 tablet (25 mg total) by mouth 2 (two) times daily. 60 tablet 0  . sacubitril-valsartan (ENTRESTO) 49-51 MG Take 1 tablet by mouth 2 (two) times daily. 60 tablet 1  . spironolactone (ALDACTONE) 25 MG tablet Take 1 tablet (25 mg total) by mouth daily. 30 tablet 0   No facility-administered medications prior to visit.     ROS Review of Systems  Constitutional: Positive for unexpected weight change. Negative for appetite change, diaphoresis and fatigue.  HENT: Negative.   Eyes: Negative for visual disturbance.  Respiratory: Negative for cough, chest tightness, shortness of breath and wheezing.   Cardiovascular: Negative for chest pain, palpitations and leg swelling.  Gastrointestinal: Negative for abdominal pain, constipation, diarrhea, nausea and vomiting.  Endocrine: Negative.   Genitourinary: Negative for decreased urine volume, difficulty urinating and urgency.  Musculoskeletal: Negative.  Negative for arthralgias.  Skin: Negative.  Negative for color change.  Neurological: Negative for dizziness, weakness, light-headedness and headaches.  Hematological: Negative for adenopathy. Does not bruise/bleed easily.  Psychiatric/Behavioral: Negative.     Objective:  BP 122/82 (BP Location: Left Arm, Patient Position: Sitting, Cuff Size: Large)   Pulse 83    Temp 99 F (37.2 C) (Oral)   Ht 6\' 1"  (1.854 m)   Wt (!) 315 lb (142.9 kg)   SpO2 96%   BMI 41.56 kg/m   BP Readings from Last 3 Encounters:  12/19/18 122/82  12/12/18 126/88  11/21/18 (!) 160/120    Wt Readings from Last 3 Encounters:  12/19/18 (!) 315 lb (142.9 kg)  12/12/18 (!) 312 lb (141.5 kg)  11/21/18 (!) 311 lb 4 oz (141.2 kg)    Physical Exam Vitals signs reviewed.  Constitutional:      Appearance: He is obese.  HENT:     Nose: Nose normal. No congestion or rhinorrhea.     Mouth/Throat:     Mouth: Mucous membranes are moist.     Pharynx: No posterior oropharyngeal erythema.  Eyes:     General: No scleral icterus.    Conjunctiva/sclera: Conjunctivae normal.  Neck:     Musculoskeletal: Normal range of motion. No neck rigidity.  Cardiovascular:     Rate and Rhythm: Normal rate and regular rhythm.     Heart sounds: No murmur. No gallop.   Pulmonary:     Effort: Pulmonary effort is normal.     Breath sounds: No stridor. No wheezing, rhonchi or rales.  Abdominal:     Palpations: There is no hepatomegaly, splenomegaly or mass.     Tenderness: There is no abdominal tenderness.     Hernia: No hernia is present.  Musculoskeletal: Normal range of motion.        General: No swelling.     Right lower leg: No edema.     Left lower leg: No edema.  Lymphadenopathy:  Cervical: No cervical adenopathy.  Skin:    General: Skin is warm and dry.     Coloration: Skin is not pale.     Findings: No rash.  Neurological:     General: No focal deficit present.     Mental Status: He is oriented to person, place, and time. Mental status is at baseline.  Psychiatric:        Mood and Affect: Mood normal.        Behavior: Behavior normal.        Thought Content: Thought content normal.        Judgment: Judgment normal.     Lab Results  Component Value Date   WBC 9.5 11/12/2018   HGB 13.2 11/12/2018   HCT 42.6 11/12/2018   PLT 328 11/12/2018   GLUCOSE 83 12/19/2018     CHOL 244 (H) 11/21/2018   TRIG 108.0 11/21/2018   HDL 40.20 11/21/2018   LDLCALC 182 (H) 11/21/2018   ALT 16 10/03/2017   AST 16 10/03/2017   NA 138 12/19/2018   K 3.9 12/19/2018   CL 106 12/19/2018   CREATININE 1.62 (H) 12/19/2018   BUN 19 12/19/2018   CO2 25 12/19/2018   TSH 1.12 11/21/2018   INR 1.09 10/22/2014   HGBA1C 6.2 11/21/2018    No results found.  Assessment & Plan:   Steve Carrillo was seen today for hypertension and congestive heart failure.  Diagnoses and all orders for this visit:  Diuretic-induced hypokalemia- His potassium level is in the normal range now. -     Basic metabolic panel; Future  Essential hypertension, malignant- His blood pressure is adequately well controlled.  Electrolytes and renal function are normal. -     Basic metabolic panel; Future  Need for influenza vaccination -     Flu Vaccine QUAD 36+ mos IM   I am having Steve Carrillo maintain his spironolactone, carvedilol, sacubitril-valsartan, and atorvastatin.  No orders of the defined types were placed in this encounter.    Follow-up: Return in about 4 months (around 04/19/2019).  Steve Carrillo

## 2018-12-20 NOTE — Telephone Encounter (Addendum)
Appeal was approved to 11/29/2019. Pt contacted and informed of same via detailed vm.

## 2019-01-06 ENCOUNTER — Other Ambulatory Visit: Payer: Self-pay | Admitting: Internal Medicine

## 2019-01-06 DIAGNOSIS — T502X5A Adverse effect of carbonic-anhydrase inhibitors, benzothiadiazides and other diuretics, initial encounter: Secondary | ICD-10-CM

## 2019-01-06 DIAGNOSIS — I5022 Chronic systolic (congestive) heart failure: Secondary | ICD-10-CM

## 2019-01-06 DIAGNOSIS — I429 Cardiomyopathy, unspecified: Secondary | ICD-10-CM

## 2019-01-06 DIAGNOSIS — E876 Hypokalemia: Secondary | ICD-10-CM

## 2019-01-06 DIAGNOSIS — I10 Essential (primary) hypertension: Secondary | ICD-10-CM

## 2019-06-16 ENCOUNTER — Other Ambulatory Visit: Payer: Self-pay | Admitting: Internal Medicine

## 2019-06-16 DIAGNOSIS — I5022 Chronic systolic (congestive) heart failure: Secondary | ICD-10-CM

## 2019-06-16 DIAGNOSIS — E876 Hypokalemia: Secondary | ICD-10-CM

## 2019-06-16 DIAGNOSIS — I10 Essential (primary) hypertension: Secondary | ICD-10-CM

## 2019-08-21 ENCOUNTER — Encounter: Payer: Self-pay | Admitting: Internal Medicine

## 2019-08-21 ENCOUNTER — Telehealth: Payer: Self-pay

## 2019-08-21 ENCOUNTER — Other Ambulatory Visit (INDEPENDENT_AMBULATORY_CARE_PROVIDER_SITE_OTHER): Payer: BC Managed Care – PPO

## 2019-08-21 ENCOUNTER — Other Ambulatory Visit: Payer: Self-pay

## 2019-08-21 ENCOUNTER — Ambulatory Visit (INDEPENDENT_AMBULATORY_CARE_PROVIDER_SITE_OTHER): Payer: BC Managed Care – PPO | Admitting: Internal Medicine

## 2019-08-21 VITALS — BP 182/120 | HR 89 | Temp 98.3°F | Resp 16 | Ht 73.0 in | Wt 317.8 lb

## 2019-08-21 DIAGNOSIS — E785 Hyperlipidemia, unspecified: Secondary | ICD-10-CM | POA: Diagnosis not present

## 2019-08-21 DIAGNOSIS — T502X5A Adverse effect of carbonic-anhydrase inhibitors, benzothiadiazides and other diuretics, initial encounter: Secondary | ICD-10-CM

## 2019-08-21 DIAGNOSIS — I10 Essential (primary) hypertension: Secondary | ICD-10-CM | POA: Diagnosis not present

## 2019-08-21 DIAGNOSIS — E781 Pure hyperglyceridemia: Secondary | ICD-10-CM

## 2019-08-21 DIAGNOSIS — R7303 Prediabetes: Secondary | ICD-10-CM

## 2019-08-21 DIAGNOSIS — I5022 Chronic systolic (congestive) heart failure: Secondary | ICD-10-CM | POA: Diagnosis not present

## 2019-08-21 DIAGNOSIS — Z23 Encounter for immunization: Secondary | ICD-10-CM

## 2019-08-21 DIAGNOSIS — E876 Hypokalemia: Secondary | ICD-10-CM

## 2019-08-21 DIAGNOSIS — G4733 Obstructive sleep apnea (adult) (pediatric): Secondary | ICD-10-CM

## 2019-08-21 DIAGNOSIS — I429 Cardiomyopathy, unspecified: Secondary | ICD-10-CM

## 2019-08-21 LAB — CBC WITH DIFFERENTIAL/PLATELET
Basophils Absolute: 0.1 10*3/uL (ref 0.0–0.1)
Basophils Relative: 1.2 % (ref 0.0–3.0)
Eosinophils Absolute: 0.2 10*3/uL (ref 0.0–0.7)
Eosinophils Relative: 2.9 % (ref 0.0–5.0)
HCT: 46 % (ref 39.0–52.0)
Hemoglobin: 15.2 g/dL (ref 13.0–17.0)
Lymphocytes Relative: 36.6 % (ref 12.0–46.0)
Lymphs Abs: 2.4 10*3/uL (ref 0.7–4.0)
MCHC: 33 g/dL (ref 30.0–36.0)
MCV: 84.6 fl (ref 78.0–100.0)
Monocytes Absolute: 0.8 10*3/uL (ref 0.1–1.0)
Monocytes Relative: 11.9 % (ref 3.0–12.0)
Neutro Abs: 3.1 10*3/uL (ref 1.4–7.7)
Neutrophils Relative %: 47.4 % (ref 43.0–77.0)
Platelets: 282 10*3/uL (ref 150.0–400.0)
RBC: 5.44 Mil/uL (ref 4.22–5.81)
RDW: 14.8 % (ref 11.5–15.5)
WBC: 6.4 10*3/uL (ref 4.0–10.5)

## 2019-08-21 LAB — LIPID PANEL
Cholesterol: 245 mg/dL — ABNORMAL HIGH (ref 0–200)
HDL: 35.9 mg/dL — ABNORMAL LOW (ref >39.00)
NonHDL: 209.02
Total CHOL/HDL Ratio: 7
Triglycerides: 257 mg/dL — ABNORMAL HIGH (ref 0.0–149.0)
VLDL: 51.4 mg/dL — ABNORMAL HIGH (ref 0.0–40.0)

## 2019-08-21 LAB — BASIC METABOLIC PANEL
BUN: 20 mg/dL (ref 6–23)
CO2: 25 mEq/L (ref 19–32)
Calcium: 9.5 mg/dL (ref 8.4–10.5)
Chloride: 104 mEq/L (ref 96–112)
Creatinine, Ser: 1.46 mg/dL (ref 0.40–1.50)
GFR: 65.48 mL/min (ref >60.00)
Glucose, Bld: 94 mg/dL (ref 70–99)
Potassium: 3.5 mEq/L (ref 3.5–5.1)
Sodium: 139 mEq/L (ref 135–145)

## 2019-08-21 LAB — HEPATIC FUNCTION PANEL
ALT: 16 U/L (ref 0–53)
AST: 13 U/L (ref 0–37)
Albumin: 4.2 g/dL (ref 3.5–5.2)
Alkaline Phosphatase: 73 U/L (ref 39–117)
Bilirubin, Direct: 0 mg/dL (ref 0.0–0.3)
Total Bilirubin: 0.4 mg/dL (ref 0.2–1.2)
Total Protein: 7.3 g/dL (ref 6.0–8.3)

## 2019-08-21 LAB — LDL CHOLESTEROL, DIRECT: Direct LDL: 184 mg/dL

## 2019-08-21 LAB — HEMOGLOBIN A1C: Hgb A1c MFr Bld: 6.1 % (ref 4.6–6.5)

## 2019-08-21 MED ORDER — SPIRONOLACTONE 25 MG PO TABS: 25.0000 mg | ORAL_TABLET | Freq: Every day | ORAL | 0 refills | Status: DC

## 2019-08-21 MED ORDER — CARVEDILOL 25 MG PO TABS: 25.0000 mg | ORAL_TABLET | Freq: Two times a day (BID) | ORAL | 0 refills | Status: DC

## 2019-08-21 MED ORDER — ATORVASTATIN CALCIUM 40 MG PO TABS: 40.0000 mg | ORAL_TABLET | Freq: Every day | ORAL | 1 refills | Status: DC

## 2019-08-21 MED ORDER — ENTRESTO 24-26 MG PO TABS: 1.0000 | ORAL_TABLET | Freq: Two times a day (BID) | ORAL | 0 refills | Status: DC

## 2019-08-21 MED ORDER — VASCEPA 1 G PO CAPS: 2.0000 | ORAL_CAPSULE | Freq: Two times a day (BID) | ORAL | 1 refills | Status: DC

## 2019-08-21 NOTE — Progress Notes (Signed)
Subjective:  Patient ID: Steve Carrillo, male    DOB: 11/15/1981  Age: 38 y.o. MRN: [id number]  CC: Hypertension   HPI Steve Carrillo presents for f/up - According to his prescription refills he would have run out of his antihypertensives months ago.  He tells me he thinks he is taking some of them but not all of them.  He is not sure which ones.  He tells me he never picked up the prescription for the statin.  He is not using his CPAP machine.  He does not monitor his blood pressure.  He denies any recent episodes of headache, chest pain, shortness of breath, or edema.  Outpatient Medications Prior to Visit  Medication Sig Dispense Refill  . carvedilol (COREG) 25 MG tablet TAKE 1 TABLET BY MOUTH TWICE DAILY 180 tablet 0  . sacubitril-valsartan (ENTRESTO) 49-51 MG Take 1 tablet by mouth 2 (two) times daily. 60 tablet 1  . spironolactone (ALDACTONE) 25 MG tablet TAKE 1 TABLET BY MOUTH ONCE DAILY 90 tablet 0  . atorvastatin (LIPITOR) 40 MG tablet Take 1 tablet (40 mg total) by mouth daily. (Patient not taking: Reported on 08/21/2019) 90 tablet 3   No facility-administered medications prior to visit.     ROS Review of Systems  Constitutional: Positive for unexpected weight change (wt gain). Negative for diaphoresis and fatigue.  HENT: Negative.   Eyes: Negative for visual disturbance.  Respiratory: Positive for apnea. Negative for cough, shortness of breath and wheezing.   Cardiovascular: Negative for chest pain, palpitations and leg swelling.  Gastrointestinal: Negative for abdominal pain, constipation, diarrhea, nausea and vomiting.  Endocrine: Negative.   Genitourinary: Negative.  Negative for difficulty urinating.  Musculoskeletal: Negative.  Negative for arthralgias and myalgias.  Skin: Negative.  Negative for color change and pallor.  Neurological: Negative for dizziness, weakness and light-headedness.  Hematological: Negative for adenopathy. Does not bruise/bleed easily.   Psychiatric/Behavioral: Negative.     Objective:  BP (!) 182/120 (BP Location: Left Arm, Patient Position: Sitting, Cuff Size: Large)   Pulse 89   Temp 98.3 F (36.8 C) (Oral)   Resp 16   Ht 6\' 1"  (1.854 m)   Wt (!) 317 lb 12 oz (144.1 kg)   SpO2 96%   BMI 41.92 kg/m   BP Readings from Last 3 Encounters:  08/21/19 (!) 182/120  12/19/18 122/82  12/12/18 126/88    Wt Readings from Last 3 Encounters:  08/21/19 (!) 317 lb 12 oz (144.1 kg)  12/19/18 (!) 315 lb (142.9 kg)  12/12/18 (!) 312 lb (141.5 kg)    Physical Exam Constitutional:      Appearance: He is obese. He is not ill-appearing or diaphoretic.  HENT:     Nose: Nose normal.     Mouth/Throat:     Mouth: Mucous membranes are moist.  Eyes:     Conjunctiva/sclera: Conjunctivae normal.  Neck:     Musculoskeletal: Normal range of motion and neck supple.  Cardiovascular:     Rate and Rhythm: Normal rate and regular rhythm.  No extrasystoles are present.    Chest Wall: PMI is not displaced.     Heart sounds: S1 normal and S2 normal. No murmur. No gallop.      Comments: EKG --  Sinus  Rhythm  -Nonspecific QRS widening.   -Right atrial enlargement.   BORDERLINE- no sign change from the prior EKG  Pulmonary:     Effort: Pulmonary effort is normal.     Breath sounds:  No stridor. No wheezing, rhonchi or rales.  Abdominal:     General: Abdomen is protuberant. Bowel sounds are normal. There is no distension.     Palpations: There is no hepatomegaly or splenomegaly.     Tenderness: There is no abdominal tenderness.  Musculoskeletal:     Right lower leg: No edema.     Left lower leg: No edema.  Skin:    General: Skin is warm and dry.  Neurological:     General: No focal deficit present.     Mental Status: He is alert.  Psychiatric:        Mood and Affect: Mood normal.        Behavior: Behavior normal.     Lab Results  Component Value Date   WBC 6.4 08/21/2019   HGB 15.2 08/21/2019   HCT 46.0 08/21/2019    PLT 282.0 08/21/2019   GLUCOSE 94 08/21/2019   CHOL 245 (H) 08/21/2019   TRIG 257.0 (H) 08/21/2019   HDL 35.90 (L) 08/21/2019   LDLDIRECT 184.0 08/21/2019   LDLCALC 182 (H) 11/21/2018   ALT 16 08/21/2019   AST 13 08/21/2019   NA 139 08/21/2019   K 3.5 08/21/2019   CL 104 08/21/2019   CREATININE 1.46 08/21/2019   BUN 20 08/21/2019   CO2 25 08/21/2019   TSH 1.12 11/21/2018   INR 1.09 10/22/2014   HGBA1C 6.1 08/21/2019    No results found.  Assessment & Plan:   Steve Carrillo was seen today for hypertension.  Diagnoses and all orders for this visit:  Essential hypertension, malignant- His blood pressure is not well controlled due to noncompliance.  His EKG is unchanged.  His labs are negative for secondary causes or endorgan damage.  I have asked him to restart his antihypertensives. -     CBC with Differential/Platelet; Future -     Basic metabolic panel; Future -     EKG 12-Lead -     spironolactone (ALDACTONE) 25 MG tablet; Take 1 tablet (25 mg total) by mouth daily. -     carvedilol (COREG) 25 MG tablet; Take 1 tablet (25 mg total) by mouth 2 (two) times daily.  Prediabetes- His A1c is at 6.1%.  Medical therapy is not yet indicated. -     Basic metabolic panel; Future -     Hemoglobin A1c; Future  Hyperlipidemia with target LDL less than 130- I have asked him to start taking the statin for CV risk reduction. -     Lipid panel; Future -     Hepatic function panel; Future  Need for influenza vaccination -     Flu Vaccine QUAD 36+ mos IM  Chronic systolic congestive heart failure ([place])- Will restart Entresto at the low dose.  After a month I will increase to the higher dose. -     EKG 12-Lead -     Ambulatory referral to Cardiology -     sacubitril-valsartan (ENTRESTO) 24-26 MG; Take 1 tablet by mouth 2 (two) times daily. -     spironolactone (ALDACTONE) 25 MG tablet; Take 1 tablet (25 mg total) by mouth daily. -     carvedilol (COREG) 25 MG tablet; Take 1 tablet (25 mg  total) by mouth 2 (two) times daily.  OSA (obstructive sleep apnea)- I recommended that he be evaluated and treated for sleep apnea. -     Ambulatory referral to Sleep Studies  Diuretic-induced hypokalemia -     spironolactone (ALDACTONE) 25 MG tablet; Take 1 tablet (  25 mg total) by mouth daily.  Hyperlipidemia LDL goal <130 -     atorvastatin (LIPITOR) 40 MG tablet; Take 1 tablet (40 mg total) by mouth daily.  Cardiomyopathy, unspecified type (HCC)- He appears to have a normal volume status today.  I have asked him to be more compliant with his meds.  I also think he would benefit from a follow-up with cardiology. -     carvedilol (COREG) 25 MG tablet; Take 1 tablet (25 mg total) by mouth 2 (two) times daily.  Pure hypertriglyceridemia- I have asked him to start taking icosapent ethyl to reduce the risk of complications like pancreatitis and for cardiovascular risk reduction. -     Icosapent Ethyl (VASCEPA) 1 g CAPS; Take 2 capsules (2 g total) by mouth 2 (two) times daily.   I have discontinued Reshaun J. Backhaus's sacubitril-valsartan. I have also changed his spironolactone and carvedilol. Additionally, I am having him start on Entresto and Vascepa. Lastly, I am having him maintain his atorvastatin.  Meds ordered this encounter  Medications  . sacubitril-valsartan (ENTRESTO) 24-26 MG    Sig: Take 1 tablet by mouth 2 (two) times daily.    Dispense:  60 tablet    Refill:  0  . spironolactone (ALDACTONE) 25 MG tablet    Sig: Take 1 tablet (25 mg total) by mouth daily.    Dispense:  30 tablet    Refill:  0  . atorvastatin (LIPITOR) 40 MG tablet    Sig: Take 1 tablet (40 mg total) by mouth daily.    Dispense:  90 tablet    Refill:  1  . carvedilol (COREG) 25 MG tablet    Sig: Take 1 tablet (25 mg total) by mouth 2 (two) times daily.    Dispense:  180 tablet    Refill:  0  . Icosapent Ethyl (VASCEPA) 1 g CAPS    Sig: Take 2 capsules (2 g total) by mouth 2 (two) times daily.     Dispense:  360 capsule    Refill:  1     Follow-up: Return in about 3 weeks (around 09/11/2019).  Sanda Lingerhomas Shanetra Blumenstock, MD

## 2019-08-21 NOTE — Telephone Encounter (Signed)
PA was approved. 

## 2019-08-21 NOTE — Patient Instructions (Signed)

## 2019-08-21 NOTE — Telephone Encounter (Signed)
Key: [id number]

## 2019-08-25 NOTE — Telephone Encounter (Signed)
Left detailed message informing patient of same.

## 2019-08-27 ENCOUNTER — Encounter: Payer: Self-pay | Admitting: Cardiovascular Disease

## 2019-08-27 ENCOUNTER — Other Ambulatory Visit: Payer: Self-pay

## 2019-08-27 ENCOUNTER — Ambulatory Visit (INDEPENDENT_AMBULATORY_CARE_PROVIDER_SITE_OTHER): Payer: BC Managed Care – PPO | Admitting: Cardiovascular Disease

## 2019-08-27 VITALS — BP 156/109 | HR 77 | Ht 73.0 in | Wt 316.4 lb

## 2019-08-27 DIAGNOSIS — Z79899 Other long term (current) drug therapy: Secondary | ICD-10-CM

## 2019-08-27 DIAGNOSIS — E782 Mixed hyperlipidemia: Secondary | ICD-10-CM

## 2019-08-27 DIAGNOSIS — N183 Chronic kidney disease, stage 3 unspecified: Secondary | ICD-10-CM

## 2019-08-27 DIAGNOSIS — I5042 Chronic combined systolic (congestive) and diastolic (congestive) heart failure: Secondary | ICD-10-CM | POA: Diagnosis not present

## 2019-08-27 DIAGNOSIS — I10 Essential (primary) hypertension: Secondary | ICD-10-CM

## 2019-08-27 DIAGNOSIS — I429 Cardiomyopathy, unspecified: Secondary | ICD-10-CM

## 2019-08-27 NOTE — Patient Instructions (Addendum)
Medication Instructions:  CARVEDILOL: Take 1/4 tablet twice a day for one week, take 1/2 a tablet twice a day for one week, then take the whole 25 mg tablet twice a day.  If you need a refill on your cardiac medications before your next appointment, please call your pharmacy.   Lab work: Your provider would like for you to return in 6 weeks to have the following labs drawn: BMET. You do not need an appointment for the lab. Once in our office lobby there is a podium where you can sign in and ring the doorbell to alert [place] that you are here. The lab is open from 8:00 am to 4:30 pm; closed for lunch from 12:45pm-1:45pm.  If you have labs (blood work) drawn today and your tests are completely normal, you will receive your results only by: Marland Kitchen MyChart Message (if you have MyChart) OR . A paper copy in the mail If you have any lab test that is abnormal or we need to change your treatment, we will call you to review the results.  Testing/Procedures: None ordered  Follow-Up: Follow up in 3 weeks with Dr. Sallyanne Kuster for a virtual visit  Any Other Special Instructions Will Be Listed Below (If Applicable). We will work on getting a blood pressure cuff for you.  Please check your blood pressure daily and keep a log of these readings.

## 2019-08-27 NOTE — Progress Notes (Signed)
.    Cardiology Office Note    Date:  08/27/2019   ID:  Steve Carrillo, DOB 15-Nov-1981, MRN [id number]  PCP:  Etta Grandchild, MD  Cardiologist:   Thurmon Fair, MD   Chief Complaint  Patient presents with  . Congestive Heart Failure    History of Present Illness:  Steve Carrillo is a 38 y.o. male with severe systemic hypertension, hypertensive heart disease (left ventricular hypertrophy and moderately depressed left ventricular systolic function) with compensated heart failure, mild chronic kidney disease, mildly dilated aortic root, hyperlipidemia, obesity and prediabetes.   He has not had any major health issues since his last visit in January, but continues to be severely hypertensive (182/120 at his visit with Dr. Yetta Barre last week).  All his prescriptions were refilled at that time, but he has not yet picked up his medications.  He is waiting for his paycheck.  Not surprisingly, his blood pressure still severely elevated (156/109 today).  Last seen in the emergency room on December 10 with mild heart failure exacerbation, related to medication noncompliance, improved with intravenous Lasix.  He is morbidly obese.  Interestingly, he has not developed any signs or symptoms of heart failure since that time, although he is not taking loop diuretics  His echocardiogram in 2017 showed an ejection fraction of 35-40%, unchanged since 2015.  Catheterization did not show any evidence of coronary disease in 2015.  Sleep study showed very mild obstructive sleep apnea and CPAP was not recommended.   Past Medical History:  Diagnosis Date  . 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 Dilated aortic root (HCC)    seen on previous echo 02/2016  . Hyperlipidemia LDL goal <130 2015  . Hypertension   . Prediabetes     Past Surgical History:  Procedure Laterality Date  . HERNIA REPAIR    . LEFT HEART CATHETERIZATION WITH CORONARY  ANGIOGRAM N/A 10/22/2014   Procedure: LEFT HEART CATHETERIZATION WITH CORONARY ANGIOGRAM;  Surgeon: Peter M Swaziland, MD; Normal coronary anatomy. Vessels are very large due to hypertensive cardiomyopathy.     Current Medications: Outpatient Medications Prior to Visit  Medication Sig Dispense Refill  . atorvastatin (LIPITOR) 40 MG tablet Take 1 tablet (40 mg total) by mouth daily. 90 tablet 1  . carvedilol (COREG) 25 MG tablet Take 1 tablet (25 mg total) by mouth 2 (two) times daily. 180 tablet 0  . Icosapent Ethyl (VASCEPA) 1 g CAPS Take 2 capsules (2 g total) by mouth 2 (two) times daily. 360 capsule 1  . sacubitril-valsartan (ENTRESTO) 24-26 MG Take 1 tablet by mouth 2 (two) times daily. 60 tablet 0  . spironolactone (ALDACTONE) 25 MG tablet Take 1 tablet (25 mg total) by mouth daily. 30 tablet 0   No facility-administered medications prior to visit.      Allergies:   Patient has no known allergies.   Social History   Socioeconomic History  . Marital status: Single    Spouse name: Not on file  . Number of children: Not on file  . Years of education: Not on file  . Highest education level: Not on file  Occupational History  . Occupation: Mining engineer: [employer]  Social Needs  . Financial resource strain: Not on file  . Food insecurity    Worry: Not on file    Inability: Not on file  . Transportation needs  Medical: Not on file    Non-medical: Not on file  Tobacco Use  . Smoking status: Former Smoker    Quit date: 09/14/2014    Years since quitting: 4.9  . Smokeless tobacco: Never Used  Substance and Sexual Activity  . Alcohol use: Yes    Comment: Twice a month  . Drug use: No  . Sexual activity: Not on file  Lifestyle  . Physical activity    Days per week: Not on file    Minutes per session: Not on file  . Stress: Not on file  Relationships  . Social Herbalist on phone: Not on file    Gets together: Not on file    Attends  religious service: Not on file    Active member of club or organization: Not on file    Attends meetings of clubs or organizations: Not on file    Relationship status: Not on file  Other Topics Concern  . Not on file  Social History Narrative   Lives with girlfriend.      Family History:  The patient's family history includes Diabetes Mellitus II in his maternal grandmother and mother; Hypertension in his mother.   ROS:   Please see the history of present illness.    ROS all other systems are reviewed and are negative   PHYSICAL EXAM:   VS:  BP (!) 156/109   Pulse 77   Ht 6\' 1"  (1.854 m)   Wt (!) 316 lb 6.4 oz (143.5 kg)   SpO2 97%   BMI 41.74 kg/m      General: Alert, oriented x3, no distress, morbidly obese Head: no evidence of trauma, PERRL, EOMI, no exophtalmos or lid lag, no myxedema, no xanthelasma; normal ears, nose and oropharynx Neck: normal jugular venous pulsations and no hepatojugular reflux; brisk carotid pulses without delay and no carotid bruits Chest: clear to auscultation, no signs of consolidation by percussion or palpation, normal fremitus, symmetrical and full respiratory excursions Cardiovascular: normal position and quality of the apical impulse, regular rhythm, normal first and second heart sounds, no murmurs, rubs or gallops Abdomen: no tenderness or distention, no masses by palpation, no abnormal pulsatility or arterial bruits, normal bowel sounds, no hepatosplenomegaly Extremities: no clubbing, cyanosis or edema; 2+ radial, ulnar and brachial pulses bilaterally; 2+ right femoral, posterior tibial and dorsalis pedis pulses; 2+ left femoral, posterior tibial and dorsalis pedis pulses; no subclavian or femoral bruits Neurological: grossly nonfocal Psych: Normal mood and affect    Wt Readings from Last 3 Encounters:  08/27/19 (!) 316 lb 6.4 oz (143.5 kg)  08/21/19 (!) 317 lb 12 oz (144.1 kg)  12/19/18 (!) 315 lb (142.9 kg)      Studies/Labs  Reviewed:   EKG:  EKG is ordered today.  ECG from 08/21/2019 sinus rhythm with nonspecific intraventricular conduction delay (QRS 112 ms)  Recent Labs: 11/12/2018: B Natriuretic Peptide 442.5 11/21/2018: Magnesium 2.2; Pro B Natriuretic peptide (BNP) 370.0; TSH 1.12 08/21/2019: ALT 16; BUN 20; Creatinine, Ser 1.46; Hemoglobin 15.2; Platelets 282.0; Potassium 3.5; Sodium 139   Lipid Panel    Component Value Date/Time   CHOL 245 (H) 08/21/2019 0848   TRIG 257.0 (H) 08/21/2019 0848   HDL 35.90 (L) 08/21/2019 0848   CHOLHDL 7 08/21/2019 0848   VLDL 51.4 (H) 08/21/2019 0848   LDLCALC 182 (H) 11/21/2018 1255   LDLDIRECT 184.0 08/21/2019 0848    ASSESSMENT:    1. Chronic combined systolic and diastolic congestive heart  failure (HCC)   2. Malignant hypertension   3. CKD (chronic kidney disease) stage 3, GFR 30-59 ml/min (HCC)   4. Mixed hyperlipidemia   5. Morbid obesity (HCC)   6. Medication management      PLAN:  In order of problems listed above:  1. CHF: Seems to have good functional status and no signs of hypervolemia although he is not taking a loop diuretic.  Reinforced the importance of daily weight monitoring and avoiding sodium rich foods.  He has not restarted any of his medications.  I believe he needs to restart the beta-blocker gradually.  Plan to start carvedilol 6.25 mg twice daily for a week then 12.5 mg twice daily for another week before he goes to the full dose of 25 mg twice daily. 2. Malignant hypertension: Asked him to call us back with blood pressure readings in about 3 weeks.  We will try to see if we can get him a blood pressure cuff and hook him up with a health coach. 3. CKD: Renal function parameters are stable or improved compared to earlier in the year.  Potassium is low normal range.  Need to recheck both the potassium level and creatinine after he has been on the combination of Entresto and spironolactone. 4. HLP: Severe hypercholesterolemia and mild  hypertriglyceridemia.  To restart atorvastatin and Vascepaa. 5. Morbid obesity: He only had mild evidence of apnea/hypopnea on his sleep study and CPAP was not recommended.  Strongly encouraged to follow-up calorie restricted diet and lose weight, he also needs to start exercising regularly by walking    Medication Adjustments/Labs and Tests Ordered: Current medicines are reviewed at length with the patient today.  Concerns regarding medicines are outlined above.  Medication changes, Labs and Tests ordered today are listed in the Patient Instructions below. Patient Instructions  Medication Instructions:  CARVEDILOL: Take 1/4 tablet twice a day for one week, take 1/2 a tablet twice a day for one week, then take the whole 25 mg tablet twice a day.  If you need a refill on your cardiac medications before your next appointment, please call your pharmacy.   Lab work: Your provider would like for you to return in 6 weeks to have the following labs drawn: BMET. You do not need an appointment for the lab. Once in our office lobby there is a podium where you can sign in and ring the doorbell to alert [place] that you are here. The lab is open from 8:00 am to 4:30 pm; closed for lunch from 12:45pm-1:45pm.  If you have labs (blood work) drawn today and your tests are completely normal, you will receive your results only by: Marland Kitchen MyChart Message (if you have MyChart) OR . A paper copy in the mail If you have any lab test that is abnormal or we need to change your treatment, we will call you to review the results.  Testing/Procedures: None ordered  Follow-Up: Follow up in 3 weeks with Dr. Royann Shivers for a virtual visit  Any Other Special Instructions Will Be Listed Below (If Applicable). We will work on getting a blood pressure cuff for you.  Please check your blood pressure daily and keep a log of these readings.       Signed, Thurmon Fair, MD  08/27/2019 8:34 AM    [facility]  [facility] [address] [place], [place], [place]  [address] Phone: [phone]; Fax: [phone]

## 2019-09-03 ENCOUNTER — Encounter: Payer: Self-pay | Admitting: Neurology

## 2019-09-03 ENCOUNTER — Institutional Professional Consult (permissible substitution): Payer: Self-pay | Admitting: Neurology

## 2019-09-03 NOTE — Progress Notes (Deleted)
NOT A [facility] SLEEP PATIENT_ Patient has had a sleep study in 2017 at [facility] Sleep lab ordered through cardiology. He has not followed up after sleep study and has not rescheduled several other procedures that were ordered through cardiology ( Echo, etc. ). No show for first consult/ visit today. Please do not reschedule.

## 2019-09-22 NOTE — Progress Notes (Deleted)
Cardiology Office Note:    Date:  09/22/2019   ID:  YECHESKEL KUREK, DOB Sep 04, 1981, MRN [id number]  PCP:  Etta Grandchild, MD  Cardiologist:  Thurmon Fair, MD   Referring MD: Etta Grandchild, MD   No chief complaint on file. ***  History of Present Illness:    Steve Carrillo is a 38 y.o. male with a hx of essentioal hypertension, hypertensive heart disease, chronic systolic and diastolic heart failure with EF of 35-40% and grade 2 DD by echo 2017, nonischemic dilated cardiomyopathy with nonobstructive CAD by heart cath in 2015, mild OSA without need for CPAP, obesity, mild CKD, HLD, and prediabetes. Renal artery [place] negative for renal artery stenosis in 2015. He was last seen in clinic with Dr. Royann Shivers on 08/27/19. He has a history of noncompliance with his anti-hypertensives, most recently being unable to pick up his medications until his paycheck was received. Since he had not had is medications, his BP was elevated at this visit. His carvedilol was restarted at 6.25 mg BID with instructions to titrate to 25 mg BID within 3 weeks. He was also started on entresto and spironolactone. Plan included getting him a BP cuff at home and rechecking labs after reinstatement of his medications.      Malignant hypertension   CKD   HLD lipitor and vascepa   chronic systolic and diastolic heart failure     Past Medical History:  Diagnosis Date  . 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 Dilated aortic root (HCC)    seen on previous echo 02/2016  . Hyperlipidemia LDL goal <130 2015  . Hypertension   . Prediabetes     Past Surgical History:  Procedure Laterality Date  . HERNIA REPAIR    . LEFT HEART CATHETERIZATION WITH CORONARY ANGIOGRAM N/A 10/22/2014   Procedure: LEFT HEART CATHETERIZATION WITH CORONARY ANGIOGRAM;  Surgeon: Peter M Swaziland, MD; Normal coronary anatomy. Vessels are very large due to hypertensive  cardiomyopathy.     Current Medications: No outpatient medications have been marked as taking for the 09/23/19 encounter (Appointment) with Marcelino Duster, PA.     Allergies:   Patient has no known allergies.   Social History   Socioeconomic History  . Marital status: Single    Spouse name: Not on file  . Number of children: Not on file  . Years of education: Not on file  . Highest education level: Not on file  Occupational History  . Occupation: Mining engineer: [employer]  Social Needs  . Financial resource strain: Not on file  . Food insecurity    Worry: Not on file    Inability: Not on file  . Transportation needs    Medical: Not on file    Non-medical: Not on file  Tobacco Use  . Smoking status: Former Smoker    Quit date: 09/14/2014    Years since quitting: 5.0  . Smokeless tobacco: Never Used  Substance and Sexual Activity  . Alcohol use: Yes    Comment: Twice a month  . Drug use: No  . Sexual activity: Not on file  Lifestyle  . Physical activity    Days per week: Not on file    Minutes per session: Not on file  . Stress: Not on file  Relationships  . Social Musician on phone: Not on file    Gets  together: Not on file    Attends religious service: Not on file    Active member of club or organization: Not on file    Attends meetings of clubs or organizations: Not on file    Relationship status: Not on file  Other Topics Concern  . Not on file  Social History Narrative   Lives with girlfriend.      Family History: The patient's ***family history includes Diabetes Mellitus II in his maternal grandmother and mother; Hypertension in his mother.  ROS:   Please see the history of present illness.    *** All other systems reviewed and are negative.  EKGs/Labs/Other Studies Reviewed:    The following studies were reviewed today: ***  EKG:  EKG is *** ordered today.  The ekg ordered today demonstrates ***  Recent  Labs: 11/12/2018: B Natriuretic Peptide 442.5 11/21/2018: Magnesium 2.2; Pro B Natriuretic peptide (BNP) 370.0; TSH 1.12 08/21/2019: ALT 16; BUN 20; Creatinine, Ser 1.46; Hemoglobin 15.2; Platelets 282.0; Potassium 3.5; Sodium 139  Recent Lipid Panel    Component Value Date/Time   CHOL 245 (H) 08/21/2019 0848   TRIG 257.0 (H) 08/21/2019 0848   HDL 35.90 (L) 08/21/2019 0848   CHOLHDL 7 08/21/2019 0848   VLDL 51.4 (H) 08/21/2019 0848   LDLCALC 182 (H) 11/21/2018 1255   LDLDIRECT 184.0 08/21/2019 0848    Physical Exam:    VS:  There were no vitals taken for this visit.    Wt Readings from Last 3 Encounters:  08/27/19 (!) 316 lb 6.4 oz (143.5 kg)  08/21/19 (!) 317 lb 12 oz (144.1 kg)  12/19/18 (!) 315 lb (142.9 kg)     GEN: *** Well nourished, well developed in no acute distress HEENT: Normal NECK: No JVD; No carotid bruits LYMPHATICS: No lymphadenopathy CARDIAC: ***RRR, no murmurs, rubs, gallops RESPIRATORY:  Clear to auscultation without rales, wheezing or rhonchi  ABDOMEN: Soft, non-tender, non-distended MUSCULOSKELETAL:  No edema; No deformity  SKIN: Warm and dry NEUROLOGIC:  Alert and oriented x 3 PSYCHIATRIC:  Normal affect   ASSESSMENT:    No diagnosis found. PLAN:    In order of problems listed above:  No diagnosis found.   Medication Adjustments/Labs and Tests Ordered: Current medicines are reviewed at length with the patient today.  Concerns regarding medicines are outlined above.  No orders of the defined types were placed in this encounter.  No orders of the defined types were placed in this encounter.   Signed, Ledora Bottcher, PA  09/22/2019 8:55 PM    [facility]

## 2019-09-23 ENCOUNTER — Telehealth (INDEPENDENT_AMBULATORY_CARE_PROVIDER_SITE_OTHER): Payer: BC Managed Care – PPO | Admitting: Physician Assistant

## 2019-09-23 ENCOUNTER — Encounter: Payer: Self-pay | Admitting: Physician Assistant

## 2019-09-23 VITALS — Ht 73.0 in

## 2019-09-23 DIAGNOSIS — N183 Chronic kidney disease, stage 3 unspecified: Secondary | ICD-10-CM

## 2019-09-23 DIAGNOSIS — I429 Cardiomyopathy, unspecified: Secondary | ICD-10-CM

## 2019-09-23 DIAGNOSIS — I5022 Chronic systolic (congestive) heart failure: Secondary | ICD-10-CM

## 2019-09-23 DIAGNOSIS — I10 Essential (primary) hypertension: Secondary | ICD-10-CM | POA: Diagnosis not present

## 2019-09-23 DIAGNOSIS — E785 Hyperlipidemia, unspecified: Secondary | ICD-10-CM

## 2019-09-23 NOTE — Patient Instructions (Signed)
Medication Instructions:  Your physician recommends that you continue on your current medications as directed. Please refer to the Current Medication list given to you today.  *If you need a refill on your cardiac medications before your next appointment, please call your pharmacy*  Lab Work: Please come to our office for blood work tomorrow, 10/21--BMET If you have labs (blood work) drawn today and your tests are completely normal, you will receive your results only by: Marland Kitchen MyChart Message (if you have MyChart) OR . A paper copy in the mail If you have any lab test that is abnormal or we need to change your treatment, we will call you to review the results.   Follow-Up: At [facility], you and your health needs are our priority.  As part of our continuing mission to provide you with exceptional heart care, we have created designated Provider Care Teams.  These Care Teams include your primary Cardiologist (physician) and Advanced Practice Providers (APPs -  Physician Assistants and Nurse Practitioners) who all work together to provide you with the care you need, when you need it.  Your next appointment:   You have been scheduled for a NURSE VISIT tomorrow at 3:00 PM. Please have blood work done before or after nurse visit.  The format for your next appointment:   Virtual Visit   Provider:   You have been scheduled for a follow-up VIRTUAL visit with Fabian Sharp, [place] on Tuesday, 09/30/19 at 10:00 AM.  Other Instructions I will mail a copy of this AVS to you. Please call to let us know if above appointment date and time will work for you. If not, we can reschedule.

## 2019-09-23 NOTE — Progress Notes (Signed)
Virtual Visit via Telephone Note   This visit type was conducted due to national recommendations for restrictions regarding the COVID-19 Pandemic (e.g. social distancing) in an effort to limit this patient's exposure and mitigate transmission in our community.  Due to his co-morbid illnesses, this patient is at least at moderate risk for complications without adequate follow up.  This format is felt to be most appropriate for this patient at this time.  The patient did not have access to video technology/had technical difficulties with video requiring transitioning to audio format only (telephone).  All issues noted in this document were discussed and addressed.  No physical exam could be performed with this format.  Please refer to the patient's chart for his  consent to telehealth for [facility].   Date:  09/23/2019   ID:  Steve Carrillo, DOB 04/07/1981, MRN [id number]  Patient Location: Home Provider Location: Home  PCP:  Janith Lima, MD  Cardiologist:  Sanda Klein, MD  Electrophysiologist:  None   Evaluation Performed:  Follow-Up Visit  Chief Complaint:  hypertension  History of Present Illness:    Steve Carrillo is a 38 y.o. male with a hx of essentioal hypertension, hypertensive heart disease, chronic systolic and diastolic heart failure with EF of 35-40% and grade 2 DD by echo 2017, nonischemic dilated cardiomyopathy with nonobstructive CAD by heart cath in 2015, mild OSA without need for CPAP, obesity, mild CKD, HLD, and prediabetes. Renal artery [place] negative for renal artery stenosis in 2015. He was last seen in clinic with Dr. Sallyanne Kuster on 08/27/19. He has a history of noncompliance with his anti-hypertensives, most recently being unable to pick up his medications until his paycheck was received. Since he had not had is medications, his BP was elevated at this visit. His carvedilol was restarted at 6.25 mg BID with instructions to titrate to 25 mg BID within 3 weeks. He was  also started on entresto and spironolactone. Plan included getting him a BP cuff at home and rechecking labs after reinstatement of his medications.    He presents today for follow up. Video was attempted but didn't work. 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 He is just now getting goff work and does not have his medications in front of him. He is unsure what dose of coreg he is taking but thinks he is taking 25 mg BID. He still does not have a BP cuff. He hasn't had a headache since restarting his medications. He denies chest pain, dizziness, and near syncope.     The patient does not have symptoms concerning for COVID-19 infection (fever, chills, cough, or new shortness of breath).    Past Medical History:  Diagnosis Date   Cardiomyopathy due to hypertension ([place]) 10/2014   NICM EF 35-40%, global LV strain is abnormal at -10.9%   Dilated aortic root ([place])    seen on previous echo 02/2016   Hyperlipidemia LDL goal <130 2015   Hypertension    Prediabetes    Past Surgical History:  Procedure Laterality Date   HERNIA REPAIR     LEFT HEART CATHETERIZATION WITH CORONARY ANGIOGRAM N/A 10/22/2014   Procedure: LEFT HEART CATHETERIZATION WITH CORONARY ANGIOGRAM;  Surgeon: Peter M Martinique, MD; Normal coronary anatomy. Vessels are very large due to hypertensive cardiomyopathy.      Current Meds  Medication Sig   atorvastatin (LIPITOR) 40 MG tablet Take 1 tablet (40 mg total) by mouth daily.   carvedilol (COREG) 25  MG tablet Take 1 tablet (25 mg total) by mouth 2 (two) times daily.   Icosapent Ethyl (VASCEPA) 1 g CAPS Take 2 capsules (2 g total) by mouth 2 (two) times daily.   sacubitril-valsartan (ENTRESTO) 24-26 MG Take 1 tablet by mouth 2 (two) times daily.   spironolactone (ALDACTONE) 25 MG tablet Take 1 tablet (25 mg total) by mouth daily.     Allergies:   Patient has no known allergies.   Social History   Tobacco Use   Smoking status: Former Smoker    Quit  date: 09/14/2014    Years since quitting: 5.0   Smokeless tobacco: Never Used  Substance Use Topics   Alcohol use: Yes    Comment: Twice a month   Drug use: No     Family Hx: The patient's family history includes Diabetes Mellitus II in his maternal grandmother and mother; Hypertension in his mother.  ROS:   Please see the history of present illness.     All other systems reviewed and are negative.   Prior CV studies:   The following studies were reviewed today:  Echo 2015 Study Conclusions - Left ventricle: The cavity size was severely dilated. Wall   thickness was increased in a pattern of moderate LVH. Systolic   function was moderately reduced. The estimated ejection fraction   was in the range of 35% to 40%. Features are consistent with a   pseudonormal left ventricular filling pattern, with concomitant   abnormal relaxation and increased filling pressure (grade 2   diastolic dysfunction). 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 Proximal ascending aorta   is dilated at 43 mm. - Left atrium: The atrium was moderately dilated.  Impressions: - Poor acoustic windows limit study.  Labs/Other Tests and Data Reviewed:    EKG:  No ECG reviewed.  Recent Labs: 11/12/2018: B Natriuretic Peptide 442.5 11/21/2018: Magnesium 2.2; Pro B Natriuretic peptide (BNP) 370.0; TSH 1.12 08/21/2019: ALT 16; BUN 20; Creatinine, Ser 1.46; Hemoglobin 15.2; Platelets 282.0; Potassium 3.5; Sodium 139   Recent Lipid Panel Lab Results  Component Value Date/Time   CHOL 245 (H) 08/21/2019 08:48 AM   TRIG 257.0 (H) 08/21/2019 08:48 AM   HDL 35.90 (L) 08/21/2019 08:48 AM   CHOLHDL 7 08/21/2019 08:48 AM   LDLCALC 182 (H) 11/21/2018 12:55 PM   LDLDIRECT 184.0 08/21/2019 08:48 AM    Wt Readings from Last 3 Encounters:  08/27/19 (!) 316 lb 6.4 oz (143.5 kg)  08/21/19 (!) 317 lb 12 oz (144.1 kg)  12/19/18 (!) 315 lb (142.9 kg)     Objective:    Vital Signs:  Ht 6\' 1"  (1.854 m)     BMI 41.74 kg/m    VITAL SIGNS:  reviewed GEN:  no acute distress RESPIRATORY:  normal respiratory effort, symmetric expansion NEURO:  alert and oriented x 3, no obvious focal deficit PSYCH:  normal affect  ASSESSMENT & PLAN:     Malignant hypertension Med list includes 25 mg coreg BID, entresto 24-26 mg BID, and 25 mg spironolactone daily. He is unsure what dose of coreg he is taking but is "taking a whole pill twice a day." I have asked him to at least start checking his BP at the nursing home if he can find a nurse/tech to do this for him. I instructed how to keep a BP log. He will do this and follow up with me in 1-2 week. Will attempt to titrate entresto pending BP readings.  CKD He will come for BMP tomorrow morning after his shift ends.    HLD Continue lipitor and vascepa   Chronic systolic and diastolic heart failure He denies SOB, DOE, orthopnea, and lower extremity swelling, Will attempt to titrate entresto pending BP readings.      COVID-19 Education: The signs and symptoms of COVID-19 were discussed with the patient and how to seek care for testing (follow up with PCP or arrange E-visit).  The importance of social distancing was discussed today.  Time:   Today, I have spent 16 minutes with the patient with telehealth technology discussing the above problems.     Medication Adjustments/Labs and Tests Ordered: Current medicines are reviewed at length with the patient today.  Concerns regarding medicines are outlined above.   Tests Ordered: No orders of the defined types were placed in this encounter.   Medication Changes: No orders of the defined types were placed in this encounter.   Follow Up:  Virtual Visit  in 1 week(s)  Signed, Marcelino DusterAngela Nicole Lyrik Dockstader, PA  09/23/2019 8:33 AM    [facility]

## 2019-09-24 ENCOUNTER — Ambulatory Visit: Payer: BC Managed Care – PPO

## 2019-09-29 NOTE — Progress Notes (Signed)
Virtual Visit via Telephone Note   This visit type was conducted due to national recommendations for restrictions regarding the COVID-19 Pandemic (e.g. social distancing) in an effort to limit this patient's exposure and mitigate transmission in our community.  Due to his co-morbid illnesses, this patient is at least at moderate risk for complications without adequate follow up.  This format is felt to be most appropriate for this patient at this time.  The patient did not have access to video technology/had technical difficulties with video requiring transitioning to audio format only (telephone).  All issues noted in this document were discussed and addressed.  No physical exam could be performed with this format.  Please refer to the patient's chart for his  consent to telehealth for [facility].   Date:  09/30/2019   ID:  Steve Carrillo, DOB January 22, 1981, MRN [id number]  Patient Location: Home Provider Location: Home  PCP:  Etta GrandchildJones, Thomas L, MD  Cardiologist:  Thurmon FairMihai Croitoru, MD  Electrophysiologist:  None   Evaluation Performed:  Follow-Up Visit  Chief Complaint:  hyperension  History of Present Illness:    Steve Carrillo is a 38 y.o. male with a hx of essentioal hypertension, hypertensive heart disease, chronic systolic and diastolic heart failure with EF of 35-40% and grade 2 DD by echo 2017, nonischemic dilated cardiomyopathy with nonobstructive CAD by heart cath in 2015, mild OSA without need for CPAP, obesity, mild CKD, HLD, and prediabetes. Renal artery [place] negative for renal artery stenosis in 2015. He was last seen in clinic with Dr. Royann Shiversroitoru on 08/27/19. He has a history of noncompliance with his anti-hypertensives, most recently being unable to pick up his medications until his paycheck was received. Since he had not had is medications, his BP was elevated at this visit. His carvedilol was restarted at 6.25 mg BID with instructions to titrate to 25 mg BID within 3 weeks. He was  also started on entresto and spironolactone. Plan included getting him a BP cuff at home and rechecking labs after reinstatement of his medications.   I saw him in clinic on 09/23/19 for a virtual visit. At that time, he was unsure what medications he was taking and had not acquired a BP cuff. With the help of staff, we were able to mail him a BP cuff shortly after our visit. He was instructed to keep a BP log and bring his medications to his next visit. He was also supposed to present for labs, but this has not happened.   He presents today for follow up. Pt was unwilling to try to a video visit. Visit was completed via telephone.  His pressure is still extremely high - reported as 181/123 at 1-2AM this morning while at work. We reviewed his medications. He forgot that he was supposed to be checking his BP regularly and doesn't have a log. He also states he never received the BP cuff we sent through the mail (I confirmed last week that BP cuffs were available and he was sent one). His med list is correct today. He reports compliance with medications.  Given his BP last night and reported compliance, medications need to be titrated, but he never got his labs done. He is unwilling to add another medication, such as hydralazine, at this time. He assures me he will get labs done this week. He states he will come on Nov 9 - I said he needs labs this week, if possible. He denies chest pain, headache, blurry vision,  reduced urine output, and stroke-like symptoms. We discussed the dangers of letting his BP run this high for prolonged periods of time.   The patient does not have symptoms concerning for COVID-19 infection (fever, chills, cough, or new shortness of breath).    Past Medical History:  Diagnosis Date   Cardiomyopathy due to hypertension ([place]) 10/2014   NICM EF 35-40%, global LV strain is abnormal at -10.9%   Dilated aortic root ([place])    seen on previous echo 02/2016   Hyperlipidemia LDL goal  <130 2015   Hypertension    Prediabetes    Past Surgical History:  Procedure Laterality Date   HERNIA REPAIR     LEFT HEART CATHETERIZATION WITH CORONARY ANGIOGRAM N/A 10/22/2014   Procedure: LEFT HEART CATHETERIZATION WITH CORONARY ANGIOGRAM;  Surgeon: Peter M Martinique, MD; Normal coronary anatomy. Vessels are very large due to hypertensive cardiomyopathy.      Current Meds  Medication Sig   atorvastatin (LIPITOR) 40 MG tablet Take 1 tablet (40 mg total) by mouth daily.   carvedilol (COREG) 25 MG tablet Take 1 tablet (25 mg total) by mouth 2 (two) times daily.   Icosapent Ethyl (VASCEPA) 1 g CAPS Take 2 capsules (2 g total) by mouth 2 (two) times daily.   sacubitril-valsartan (ENTRESTO) 24-26 MG Take 1 tablet by mouth 2 (two) times daily.   spironolactone (ALDACTONE) 25 MG tablet Take 1 tablet (25 mg total) by mouth daily.     Allergies:   Patient has no known allergies.   Social History   Tobacco Use   Smoking status: Former Smoker    Quit date: 09/14/2014    Years since quitting: 5.0   Smokeless tobacco: Never Used  Substance Use Topics   Alcohol use: Yes    Comment: Twice a month   Drug use: No     Family Hx: The patient's family history includes Diabetes Mellitus II in his maternal grandmother and mother; Hypertension in his mother.  ROS:   Please see the history of present illness.     All other systems reviewed and are negative.   Prior CV studies:   The following studies were reviewed today:  Echo 02/07/16: Study Conclusions  - Left ventricle: The cavity size was severely dilated. Wall   thickness was increased in a pattern of moderate LVH. Systolic   function was moderately reduced. The estimated ejection fraction   was in the range of 35% to 40%. Features are consistent with a   pseudonormal left ventricular filling pattern, with concomitant   abnormal relaxation and increased filling pressure (grade 2   diastolic dysfunction). - Aorta:  Aortic root is dilated at 40 mm. Proximal ascending aorta   is dilated at 43 mm. - Left atrium: The atrium was moderately dilated.  Impressions:  - Poor acoustic windows limit study.  Labs/Other Tests and Data Reviewed:    EKG:  No ECG reviewed.  Recent Labs: 11/12/2018: B Natriuretic Peptide 442.5 11/21/2018: Magnesium 2.2; Pro B Natriuretic peptide (BNP) 370.0; TSH 1.12 08/21/2019: ALT 16; BUN 20; Creatinine, Ser 1.46; Hemoglobin 15.2; Platelets 282.0; Potassium 3.5; Sodium 139   Recent Lipid Panel Lab Results  Component Value Date/Time   CHOL 245 (H) 08/21/2019 08:48 AM   TRIG 257.0 (H) 08/21/2019 08:48 AM   HDL 35.90 (L) 08/21/2019 08:48 AM   CHOLHDL 7 08/21/2019 08:48 AM   LDLCALC 182 (H) 11/21/2018 12:55 PM   LDLDIRECT 184.0 08/21/2019 08:48 AM    Wt Readings from Last 3  Encounters:  08/27/19 (!) 316 lb 6.4 oz (143.5 kg)  08/21/19 (!) 317 lb 12 oz (144.1 kg)  12/19/18 (!) 315 lb (142.9 kg)     Objective:    Vital Signs:  BP (!) 181/123    VITAL SIGNS:  reviewed GEN:  no acute distress RESPIRATORY:  normal respiratory effort, symmetric expansion NEURO:  alert and oriented x 3, no obvious focal deficit PSYCH:  normal affect  ASSESSMENT & PLAN:    Malignant hypertension Med list includes 25 mg coreg BID, entresto 24-26 mg BID, and 25 mg spironolactone daily - I checked this against his med bottles at home. Med list is correct and he reports compliance on all medications, including BID medications.  If renal function stable, I will titrate entresto. He will most likely need an additional agent to control his BP, but he does not want to add a medication at this time. I am very concerned about stroke, worsening CHF, and renal failure if his BP is not better controlled soon. He does not seemed concerned about his BP. 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 I have suggested that he at least take his BP once per night at work and keep a log. Its unclear what times of day he  takes his medications given his work schedule - works second and third shifts.  I am checking to see what the status is of his BP cuff we sent him.    CKD stage III He never came for labs. He will come this week for BMP. If stable, I will increase entresto.    HLD Continue lipitor and vascepa   Chronic systolic and diastolic heart failure He denies symptoms and signs of volume overload. Will titrate medications as above.    COVID-19 Education: The signs and symptoms of COVID-19 were discussed with the patient and how to seek care for testing (follow up with PCP or arrange E-visit).  The importance of social distancing was discussed today.  Time:   Today, I have spent 16 minutes with the patient with telehealth technology discussing the above problems.     Medication Adjustments/Labs and Tests Ordered: Current medicines are reviewed at length with the patient today.  Concerns regarding medicines are outlined above.   Tests Ordered: No orders of the defined types were placed in this encounter.   Medication Changes: No orders of the defined types were placed in this encounter.   Follow Up:  In Person in 3 week(s)   HE NEEDS LABS AND A BP LOG - in office visit so we can check his BP.   Signed, Marcelino Duster, PA  09/30/2019 10:04 AM    [facility]

## 2019-09-30 ENCOUNTER — Telehealth: Payer: Self-pay | Admitting: Physician Assistant

## 2019-09-30 ENCOUNTER — Encounter: Payer: Self-pay | Admitting: Physician Assistant

## 2019-09-30 ENCOUNTER — Telehealth (INDEPENDENT_AMBULATORY_CARE_PROVIDER_SITE_OTHER): Payer: BC Managed Care – PPO | Admitting: Physician Assistant

## 2019-09-30 VITALS — BP 181/123

## 2019-09-30 DIAGNOSIS — I5022 Chronic systolic (congestive) heart failure: Secondary | ICD-10-CM

## 2019-09-30 DIAGNOSIS — N183 Chronic kidney disease, stage 3 unspecified: Secondary | ICD-10-CM

## 2019-09-30 DIAGNOSIS — I429 Cardiomyopathy, unspecified: Secondary | ICD-10-CM | POA: Diagnosis not present

## 2019-09-30 DIAGNOSIS — E785 Hyperlipidemia, unspecified: Secondary | ICD-10-CM

## 2019-09-30 DIAGNOSIS — I10 Essential (primary) hypertension: Secondary | ICD-10-CM

## 2019-09-30 NOTE — Patient Instructions (Signed)
Medication Instructions:  No changes *If you need a refill on your cardiac medications before your next appointment, please call your pharmacy*  Lab Work: BMET this week - Thursday 10/29 If you have labs (blood work) drawn today and your tests are completely normal, you will receive your results only by: Marland Kitchen MyChart Message (if you have MyChart) OR . A paper copy in the mail If you have any lab test that is abnormal or we need to change your treatment, we will call you to review the results.  Testing/Procedures: None  Follow-Up: At [facility], you and your health needs are our priority.  As part of our continuing mission to provide you with exceptional heart care, we have created designated Provider Care Teams.  These Care Teams include your primary Cardiologist (physician) and Advanced Practice Providers (APPs -  Physician Assistants and Nurse Practitioners) who all work together to provide you with the care you need, when you need it.  Your next appointment:   Friday 11/20 @ 1130am with Angie PA  Other Instructions  Please have your blood pressure checked each night at work and record your readings

## 2019-09-30 NOTE — Telephone Encounter (Signed)
Patient contact after e-visit with Angie PA. He was advised to have BMET this week and pick up BP cuff. He said he can do this Thursday 10/29. He has been scheduled for f/up with PA on 10/24/19 @ 1130am

## 2019-10-02 ENCOUNTER — Telehealth: Payer: Self-pay | Admitting: *Deleted

## 2019-10-02 LAB — BASIC METABOLIC PANEL
BUN/Creatinine Ratio: 12 (ref 9–20)
BUN: 17 mg/dL (ref 6–20)
CO2: 20 mmol/L (ref 20–29)
Calcium: 9.3 mg/dL (ref 8.7–10.2)
Chloride: 103 mmol/L (ref 96–106)
Creatinine, Ser: 1.37 mg/dL — ABNORMAL HIGH (ref 0.76–1.27)
GFR calc Af Amer: 76 mL/min/{1.73_m2} (ref >59)
GFR calc non Af Amer: 65 mL/min/{1.73_m2} (ref >59)
Glucose: 134 mg/dL — ABNORMAL HIGH (ref 65–99)
Potassium: 4.1 mmol/L (ref 3.5–5.2)
Sodium: 139 mmol/L (ref 134–144)

## 2019-10-02 NOTE — Telephone Encounter (Signed)
-----   Message from Sanda Klein, MD sent at 10/02/2019  3:39 PM EDT ----- Blood glucose is high (in diabetes range, but not sure if this was fasting). Creatinine is very similar to previous labs performed over the last year, mildly elevated.  All other labs normal.

## 2019-10-02 NOTE — Telephone Encounter (Signed)
Left a message for the patient to call back.  

## 2019-10-03 NOTE — Telephone Encounter (Signed)
Left a message for the patient to call back. Results will be mailed.   

## 2019-10-07 ENCOUNTER — Encounter: Payer: Self-pay | Admitting: *Deleted

## 2019-10-17 ENCOUNTER — Other Ambulatory Visit: Payer: Self-pay | Admitting: Internal Medicine

## 2019-10-17 DIAGNOSIS — T502X5A Adverse effect of carbonic-anhydrase inhibitors, benzothiadiazides and other diuretics, initial encounter: Secondary | ICD-10-CM

## 2019-10-17 DIAGNOSIS — E876 Hypokalemia: Secondary | ICD-10-CM

## 2019-10-17 DIAGNOSIS — I10 Essential (primary) hypertension: Secondary | ICD-10-CM

## 2019-10-17 DIAGNOSIS — I5022 Chronic systolic (congestive) heart failure: Secondary | ICD-10-CM

## 2019-10-23 NOTE — Progress Notes (Deleted)
Cardiology Office Note:    Date:  10/23/2019   ID:  ASHTEN PRATS, DOB 1981-08-12, MRN [id number]  PCP:  Janith Lima, MD  Cardiologist:  Sanda Klein, MD   Referring MD: Janith Lima, MD   No chief complaint on file. ***  History of Present Illness:    Steve Carrillo is a 38 y.o. male with a hx of essentioal hypertension, hypertensive heart disease, chronic systolic and diastolic heart failure with EF of 35-40% and grade 2 DD by echo 2017, nonischemic dilated cardiomyopathy with nonobstructive CAD by heart cath in 2015, mild OSA without need for CPAP, obesity, mild CKD, HLD, and prediabetes. Renal artery [place] negative for renal artery stenosis in 2015. He was last seen in clinic with Dr. Sallyanne Kuster on 08/27/19. He has a history of noncompliance with his anti-hypertensives, most recently being unable to pick up his medications until his paycheck was received. Since he had not had is medications, his BP was elevated at this visit. His carvedilol was restarted at 6.25 mg BID with instructions to titrate to 25 mg BID within 3 weeks. He was also started on entresto and spironolactone. Plan included getting him a BP cuff at home and rechecking labs after reinstatement of his medications.   I saw him in clinic on 09/23/19 for a virtual visit. At that time, he was unsure what medications he was taking and had not acquired a BP cuff. With the help of staff, we were able to mail him a BP cuff shortly after our visit. He was instructed to keep a BP log and bring his medications to his next visit. He was also supposed to present for labs, but this did not happen. I saw him in follow up on 09/30/19 for another telephone visit. His pressure was elevated to 181/123. He forgot to do a BP log and had not completed labs.  He did not Carrillo to add another agent, such as hydralazine.   He presents today for an in-office visit.     Malignant hypertension Med list includes 25 mg coreg BID, entresto 24-26 mg  BID, and 25 mg spironolactone daily.   CKD stage III - sCr 1.37, improving/stable  - K was 4.1   HLD Continue lipitor and vascepa   Chronic systolic and diastolic heart failure        Past Medical History:  Diagnosis Date  . Cardiomyopathy due to hypertension ([facility]) 10/2014   NICM EF 35-40%, global LV strain is abnormal at -10.9%  . Dilated aortic root ([facility])    seen on previous echo 02/2016  . Hyperlipidemia LDL goal <130 2015  . Hypertension   . Prediabetes     Past Surgical History:  Procedure Laterality Date  . HERNIA REPAIR    . LEFT HEART CATHETERIZATION WITH CORONARY ANGIOGRAM N/A 10/22/2014   Procedure: LEFT HEART CATHETERIZATION WITH CORONARY ANGIOGRAM;  Surgeon: Peter M Martinique, MD; Normal coronary anatomy. Vessels are very large due to hypertensive cardiomyopathy.     Current Medications: No outpatient medications have been marked as taking for the 10/24/19 encounter (Appointment) with Ledora Bottcher, [place].     Allergies:   Patient has no known allergies.   Social History   Socioeconomic History  . Marital status: Single    Spouse name: Not on file  . Number of children: Not on file  . Years of education: Not on file  . Highest education level: Not on file  Occupational History  . Occupation: Scientist, water quality  Employer: [employer]  Social Needs  . Financial resource strain: Not on file  . Food insecurity    Worry: Not on file    Inability: Not on file  . Transportation needs    Medical: Not on file    Non-medical: Not on file  Tobacco Use  . Smoking status: Former Smoker    Quit date: 09/14/2014    Years since quitting: 5.1  . Smokeless tobacco: Never Used  Substance and Sexual Activity  . Alcohol use: Yes    Comment: Twice a month  . Drug use: No  . Sexual activity: Not on file  Lifestyle  . Physical activity    Days per week: Not on file    Minutes per session: Not on file  . Stress: Not on file  Relationships  .  Social Musician on phone: Not on file    Gets together: Not on file    Attends religious service: Not on file    Active member of club or organization: Not on file    Attends meetings of clubs or organizations: Not on file    Relationship status: Not on file  Other Topics Concern  . Not on file  Social History Narrative   Lives with girlfriend.      Family History: The patient's ***family history includes Diabetes Mellitus II in his maternal grandmother and mother; Hypertension in his mother.  ROS:   Please see the history of present illness.    *** All other systems reviewed and are negative.  EKGs/Labs/Other Studies Reviewed:    The following studies were reviewed today: ***  EKG:  EKG is *** ordered today.  The ekg ordered today demonstrates ***  Recent Labs: 11/12/2018: B Natriuretic Peptide 442.5 11/21/2018: Magnesium 2.2; Pro B Natriuretic peptide (BNP) 370.0; TSH 1.12 08/21/2019: ALT 16; Hemoglobin 15.2; Platelets 282.0 10/02/2019: BUN 17; Creatinine, Ser 1.37; Potassium 4.1; Sodium 139  Recent Lipid Panel    Component Value Date/Time   CHOL 245 (H) 08/21/2019 0848   TRIG 257.0 (H) 08/21/2019 0848   HDL 35.90 (L) 08/21/2019 0848   CHOLHDL 7 08/21/2019 0848   VLDL 51.4 (H) 08/21/2019 0848   LDLCALC 182 (H) 11/21/2018 1255   LDLDIRECT 184.0 08/21/2019 0848    Physical Exam:    VS:  There were no vitals taken for this visit.    Wt Readings from Last 3 Encounters:  08/27/19 (!) 316 lb 6.4 oz (143.5 kg)  08/21/19 (!) 317 lb 12 oz (144.1 kg)  12/19/18 (!) 315 lb (142.9 kg)     GEN: *** Well nourished, well developed in no acute distress HEENT: Normal NECK: No JVD; No carotid bruits LYMPHATICS: No lymphadenopathy CARDIAC: ***RRR, no murmurs, rubs, gallops RESPIRATORY:  Clear to auscultation without rales, wheezing or rhonchi  ABDOMEN: Soft, non-tender, non-distended MUSCULOSKELETAL:  No edema; No deformity  SKIN: Warm and dry NEUROLOGIC:   Alert and oriented x 3 PSYCHIATRIC:  Normal affect   ASSESSMENT:    No diagnosis found. PLAN:    In order of problems listed above:  No diagnosis found.   Medication Adjustments/Labs and Tests Ordered: Current medicines are reviewed at length with the patient today.  Concerns regarding medicines are outlined above.  No orders of the defined types were placed in this encounter.  No orders of the defined types were placed in this encounter.   Signed, Marcelino Duster, PA  10/23/2019 3:08 PM    [facility]

## 2019-10-24 ENCOUNTER — Ambulatory Visit: Payer: BC Managed Care – PPO | Admitting: Physician Assistant

## 2019-12-01 ENCOUNTER — Other Ambulatory Visit: Payer: Self-pay | Admitting: Internal Medicine

## 2019-12-01 DIAGNOSIS — E876 Hypokalemia: Secondary | ICD-10-CM

## 2019-12-01 DIAGNOSIS — I10 Essential (primary) hypertension: Secondary | ICD-10-CM

## 2019-12-01 DIAGNOSIS — I5022 Chronic systolic (congestive) heart failure: Secondary | ICD-10-CM

## 2019-12-01 DIAGNOSIS — T502X5A Adverse effect of carbonic-anhydrase inhibitors, benzothiadiazides and other diuretics, initial encounter: Secondary | ICD-10-CM

## 2019-12-08 ENCOUNTER — Other Ambulatory Visit: Payer: Self-pay | Admitting: Internal Medicine

## 2019-12-08 ENCOUNTER — Telehealth: Payer: Self-pay

## 2019-12-08 DIAGNOSIS — I5022 Chronic systolic (congestive) heart failure: Secondary | ICD-10-CM

## 2019-12-08 NOTE — Telephone Encounter (Signed)
Key: [id number]

## 2019-12-10 NOTE — Telephone Encounter (Signed)
PA was approved. Tried to call pt to inform of same. No vm to leave a message.

## 2020-01-01 ENCOUNTER — Other Ambulatory Visit: Payer: Self-pay | Admitting: Internal Medicine

## 2020-01-01 DIAGNOSIS — I429 Cardiomyopathy, unspecified: Secondary | ICD-10-CM

## 2020-01-01 DIAGNOSIS — I10 Essential (primary) hypertension: Secondary | ICD-10-CM

## 2020-01-01 DIAGNOSIS — I5022 Chronic systolic (congestive) heart failure: Secondary | ICD-10-CM

## 2020-02-21 ENCOUNTER — Other Ambulatory Visit: Payer: Self-pay | Admitting: Internal Medicine

## 2020-02-21 DIAGNOSIS — E876 Hypokalemia: Secondary | ICD-10-CM

## 2020-02-21 DIAGNOSIS — T502X5A Adverse effect of carbonic-anhydrase inhibitors, benzothiadiazides and other diuretics, initial encounter: Secondary | ICD-10-CM

## 2020-02-21 DIAGNOSIS — I5022 Chronic systolic (congestive) heart failure: Secondary | ICD-10-CM

## 2020-02-21 DIAGNOSIS — I10 Essential (primary) hypertension: Secondary | ICD-10-CM

## 2020-03-30 ENCOUNTER — Other Ambulatory Visit: Payer: Self-pay | Admitting: Internal Medicine

## 2020-03-30 DIAGNOSIS — E876 Hypokalemia: Secondary | ICD-10-CM

## 2020-03-30 DIAGNOSIS — T502X5A Adverse effect of carbonic-anhydrase inhibitors, benzothiadiazides and other diuretics, initial encounter: Secondary | ICD-10-CM

## 2020-03-30 DIAGNOSIS — I429 Cardiomyopathy, unspecified: Secondary | ICD-10-CM

## 2020-03-30 DIAGNOSIS — I5022 Chronic systolic (congestive) heart failure: Secondary | ICD-10-CM

## 2020-03-30 DIAGNOSIS — E785 Hyperlipidemia, unspecified: Secondary | ICD-10-CM

## 2020-03-30 DIAGNOSIS — E781 Pure hyperglyceridemia: Secondary | ICD-10-CM

## 2020-03-30 DIAGNOSIS — I10 Essential (primary) hypertension: Secondary | ICD-10-CM

## 2020-05-24 ENCOUNTER — Ambulatory Visit: Payer: BC Managed Care – PPO | Admitting: Internal Medicine

## 2020-05-25 ENCOUNTER — Ambulatory Visit: Payer: Self-pay | Admitting: Internal Medicine

## 2020-05-25 ENCOUNTER — Other Ambulatory Visit: Payer: Self-pay

## 2020-06-08 ENCOUNTER — Ambulatory Visit: Payer: Self-pay | Admitting: Internal Medicine

## 2020-06-08 DIAGNOSIS — Z0289 Encounter for other administrative examinations: Secondary | ICD-10-CM

## 2020-08-06 ENCOUNTER — Encounter (HOSPITAL_COMMUNITY): Payer: Self-pay

## 2020-08-06 ENCOUNTER — Emergency Department (HOSPITAL_COMMUNITY): Payer: Self-pay

## 2020-08-06 ENCOUNTER — Observation Stay (HOSPITAL_COMMUNITY): Payer: Self-pay

## 2020-08-06 ENCOUNTER — Inpatient Hospital Stay (HOSPITAL_COMMUNITY)
Admission: EM | Admit: 2020-08-06 | Discharge: 2020-08-12 | DRG: 286 | Disposition: A | Discharge status: Home / Self Care | Payer: Self-pay | Attending: Internal Medicine | Admitting: Internal Medicine

## 2020-08-06 ENCOUNTER — Other Ambulatory Visit: Payer: Self-pay

## 2020-08-06 DIAGNOSIS — Z8249 Family history of ischemic heart disease and other diseases of the circulatory system: Secondary | ICD-10-CM

## 2020-08-06 DIAGNOSIS — G4733 Obstructive sleep apnea (adult) (pediatric): Secondary | ICD-10-CM | POA: Diagnosis present

## 2020-08-06 DIAGNOSIS — R7989 Other specified abnormal findings of blood chemistry: Secondary | ICD-10-CM

## 2020-08-06 DIAGNOSIS — R079 Chest pain, unspecified: Secondary | ICD-10-CM | POA: Diagnosis present

## 2020-08-06 DIAGNOSIS — I5043 Acute on chronic combined systolic (congestive) and diastolic (congestive) heart failure: Secondary | ICD-10-CM | POA: Diagnosis present

## 2020-08-06 DIAGNOSIS — Z87891 Personal history of nicotine dependence: Secondary | ICD-10-CM

## 2020-08-06 DIAGNOSIS — I43 Cardiomyopathy in diseases classified elsewhere: Secondary | ICD-10-CM | POA: Diagnosis present

## 2020-08-06 DIAGNOSIS — E785 Hyperlipidemia, unspecified: Secondary | ICD-10-CM

## 2020-08-06 DIAGNOSIS — I130 Hypertensive heart and chronic kidney disease with heart failure and stage 1 through stage 4 chronic kidney disease, or unspecified chronic kidney disease: Principal | ICD-10-CM | POA: Diagnosis present

## 2020-08-06 DIAGNOSIS — I10 Essential (primary) hypertension: Secondary | ICD-10-CM

## 2020-08-06 DIAGNOSIS — I160 Hypertensive urgency: Secondary | ICD-10-CM | POA: Diagnosis present

## 2020-08-06 DIAGNOSIS — Z833 Family history of diabetes mellitus: Secondary | ICD-10-CM

## 2020-08-06 DIAGNOSIS — Z9114 Patient's other noncompliance with medication regimen: Secondary | ICD-10-CM

## 2020-08-06 DIAGNOSIS — I248 Other forms of acute ischemic heart disease: Secondary | ICD-10-CM | POA: Diagnosis present

## 2020-08-06 DIAGNOSIS — R9389 Abnormal findings on diagnostic imaging of other specified body structures: Secondary | ICD-10-CM | POA: Insufficient documentation

## 2020-08-06 DIAGNOSIS — Z20822 Contact with and (suspected) exposure to covid-19: Secondary | ICD-10-CM | POA: Diagnosis present

## 2020-08-06 DIAGNOSIS — I441 Atrioventricular block, second degree: Secondary | ICD-10-CM | POA: Diagnosis present

## 2020-08-06 DIAGNOSIS — R7303 Prediabetes: Secondary | ICD-10-CM | POA: Diagnosis present

## 2020-08-06 DIAGNOSIS — I2510 Atherosclerotic heart disease of native coronary artery without angina pectoris: Secondary | ICD-10-CM | POA: Diagnosis present

## 2020-08-06 DIAGNOSIS — I509 Heart failure, unspecified: Secondary | ICD-10-CM

## 2020-08-06 DIAGNOSIS — E876 Hypokalemia: Secondary | ICD-10-CM | POA: Diagnosis present

## 2020-08-06 DIAGNOSIS — I429 Cardiomyopathy, unspecified: Secondary | ICD-10-CM

## 2020-08-06 DIAGNOSIS — N182 Chronic kidney disease, stage 2 (mild): Secondary | ICD-10-CM | POA: Diagnosis present

## 2020-08-06 DIAGNOSIS — I272 Pulmonary hypertension, unspecified: Secondary | ICD-10-CM | POA: Diagnosis present

## 2020-08-06 DIAGNOSIS — I5022 Chronic systolic (congestive) heart failure: Secondary | ICD-10-CM

## 2020-08-06 DIAGNOSIS — Z6841 Body Mass Index (BMI) 40.0 and over, adult: Secondary | ICD-10-CM

## 2020-08-06 DIAGNOSIS — T502X5A Adverse effect of carbonic-anhydrase inhibitors, benzothiadiazides and other diuretics, initial encounter: Secondary | ICD-10-CM

## 2020-08-06 LAB — BRAIN NATRIURETIC PEPTIDE: B Natriuretic Peptide: 485.5 pg/mL — ABNORMAL HIGH (ref 0.0–100.0)

## 2020-08-06 LAB — BASIC METABOLIC PANEL
Anion gap: 10 (ref 5–15)
BUN: 21 mg/dL — ABNORMAL HIGH (ref 6–20)
CO2: 24 mmol/L (ref 22–32)
Calcium: 8.8 mg/dL — ABNORMAL LOW (ref 8.9–10.3)
Chloride: 104 mmol/L (ref 98–111)
Creatinine, Ser: 1.46 mg/dL — ABNORMAL HIGH (ref 0.61–1.24)
GFR calc Af Amer: >60 mL/min (ref >60)
GFR calc non Af Amer: >60 mL/min (ref >60)
Glucose, Bld: 118 mg/dL — ABNORMAL HIGH (ref 70–99)
Potassium: 3.7 mmol/L (ref 3.5–5.1)
Sodium: 138 mmol/L (ref 135–145)

## 2020-08-06 LAB — CBC
HCT: 46.3 % (ref 39.0–52.0)
Hemoglobin: 15.3 g/dL (ref 13.0–17.0)
MCH: 29 pg (ref 26.0–34.0)
MCHC: 33 g/dL (ref 30.0–36.0)
MCV: 87.7 fL (ref 80.0–100.0)
Platelets: 287 10*3/uL (ref 150–400)
RBC: 5.28 MIL/uL (ref 4.22–5.81)
RDW: 13.4 % (ref 11.5–15.5)
WBC: 6.9 10*3/uL (ref 4.0–10.5)
nRBC: 0 % (ref 0.0–0.2)

## 2020-08-06 LAB — LIPID PANEL
Cholesterol: 149 mg/dL (ref 0–200)
HDL: 27 mg/dL — ABNORMAL LOW (ref >40)
LDL Cholesterol: 107 mg/dL — ABNORMAL HIGH (ref 0–99)
Total CHOL/HDL Ratio: 5.5 RATIO
Triglycerides: 74 mg/dL (ref <150)
VLDL: 15 mg/dL (ref 0–40)

## 2020-08-06 LAB — D-DIMER, QUANTITATIVE: D-Dimer, Quant: 0.71 ug/mL-FEU — ABNORMAL HIGH (ref 0.00–0.50)

## 2020-08-06 LAB — TROPONIN I (HIGH SENSITIVITY)
Troponin I (High Sensitivity): 44 ng/L — ABNORMAL HIGH (ref <18)
Troponin I (High Sensitivity): 33 ng/L — ABNORMAL HIGH (ref <18)

## 2020-08-06 LAB — SARS CORONAVIRUS 2 BY RT PCR (HOSPITAL ORDER, PERFORMED IN ~~LOC~~ HOSPITAL LAB): SARS Coronavirus 2: NEGATIVE

## 2020-08-06 MED ORDER — CARVEDILOL 25 MG PO TABS: 25.0000 mg | ORAL_TABLET | Freq: Two times a day (BID) | ORAL | 0 refills | Status: DC

## 2020-08-06 MED ORDER — HYDRALAZINE HCL 25 MG PO TABS: 25.0000 mg | ORAL_TABLET | Freq: Once | ORAL | Status: DC

## 2020-08-06 MED ORDER — CARVEDILOL 25 MG PO TABS
25.0000 mg | ORAL_TABLET | Freq: Two times a day (BID) | ORAL | Status: DC
  Administered 2020-08-06 – 2020-08-12 (×13): 25 mg via ORAL
  Filled 2020-08-06 (×14): qty 1

## 2020-08-06 MED ORDER — ENTRESTO 24-26 MG PO TABS: 1.0000 | ORAL_TABLET | Freq: Two times a day (BID) | ORAL | 0 refills | Status: DC

## 2020-08-06 MED ORDER — NITROGLYCERIN 0.4 MG SL SUBL
0.4000 mg | SUBLINGUAL_TABLET | Freq: Once | SUBLINGUAL | Status: AC
  Administered 2020-08-06: 0.4 mg via SUBLINGUAL
  Filled 2020-08-06: qty 1

## 2020-08-06 MED ORDER — SPIRONOLACTONE 25 MG PO TABS
25.0000 mg | ORAL_TABLET | Freq: Every day | ORAL | Status: DC
  Administered 2020-08-06 – 2020-08-12 (×6): 25 mg via ORAL
  Filled 2020-08-06 (×7): qty 1

## 2020-08-06 MED ORDER — ATORVASTATIN CALCIUM 40 MG PO TABS: 40.0000 mg | ORAL_TABLET | Freq: Every day | ORAL | 0 refills | Status: DC

## 2020-08-06 MED ORDER — ATORVASTATIN CALCIUM 40 MG PO TABS
40.0000 mg | ORAL_TABLET | Freq: Every day | ORAL | Status: DC
  Administered 2020-08-07 – 2020-08-12 (×5): 40 mg via ORAL
  Filled 2020-08-06 (×5): qty 1

## 2020-08-06 MED ORDER — SODIUM CHLORIDE 0.9% FLUSH
3.0000 mL | Freq: Two times a day (BID) | INTRAVENOUS | Status: DC
  Administered 2020-08-07 – 2020-08-12 (×8): 3 mL via INTRAVENOUS

## 2020-08-06 MED ORDER — IOHEXOL 350 MG/ML SOLN
100.0000 mL | Freq: Once | INTRAVENOUS | Status: AC | PRN
  Administered 2020-08-06: 100 mL via INTRAVENOUS

## 2020-08-06 MED ORDER — ACETAMINOPHEN 325 MG PO TABS: 650.0000 mg | ORAL_TABLET | ORAL | Status: DC | PRN

## 2020-08-06 MED ORDER — LABETALOL HCL 5 MG/ML IV SOLN
10.0000 mg | Freq: Once | INTRAVENOUS | Status: AC
  Administered 2020-08-06: 10 mg via INTRAVENOUS
  Filled 2020-08-06 (×2): qty 4

## 2020-08-06 MED ORDER — SODIUM CHLORIDE 0.9% FLUSH: 3.0000 mL | INTRAVENOUS | Status: DC | PRN

## 2020-08-06 MED ORDER — SPIRONOLACTONE 25 MG PO TABS: 25.0000 mg | ORAL_TABLET | Freq: Every day | ORAL | 0 refills | Status: DC

## 2020-08-06 MED ORDER — SODIUM CHLORIDE 0.9 % IV SOLN: 250.0000 mL | INTRAVENOUS | Status: DC | PRN

## 2020-08-06 MED ORDER — LABETALOL HCL 5 MG/ML IV SOLN
10.0000 mg | Freq: Once | INTRAVENOUS | Status: AC
  Administered 2020-08-06: 10 mg via INTRAVENOUS
  Filled 2020-08-06: qty 4

## 2020-08-06 MED ORDER — SACUBITRIL-VALSARTAN 24-26 MG PO TABS
1.0000 | ORAL_TABLET | Freq: Two times a day (BID) | ORAL | Status: DC
  Administered 2020-08-06 – 2020-08-07 (×3): 1 via ORAL
  Filled 2020-08-06 (×5): qty 1

## 2020-08-06 MED ORDER — CARVEDILOL 25 MG PO TABS
25.0000 mg | ORAL_TABLET | Freq: Two times a day (BID) | ORAL | Status: DC
  Administered 2020-08-06: 25 mg via ORAL
  Filled 2020-08-06: qty 2

## 2020-08-06 MED ORDER — SODIUM CHLORIDE (PF) 0.9 % IJ SOLN
INTRAMUSCULAR | Status: AC
  Filled 2020-08-06: qty 50

## 2020-08-06 MED ORDER — FUROSEMIDE 10 MG/ML IJ SOLN
40.0000 mg | Freq: Once | INTRAMUSCULAR | Status: AC
  Administered 2020-08-06: 40 mg via INTRAVENOUS
  Filled 2020-08-06: qty 4

## 2020-08-06 NOTE — H&P (Signed)
History and Physical        Hospital Admission Note Date: 08/06/2020  Patient name: Steve Carrillo Medical record number: [id number] Date of birth: 1981/10/19 Age: 39 y.o. Gender: male  PCP: Etta GrandchildJones, Thomas L, MD  Patient coming from: Home Lives with: Wife At baseline, ambulates: Independently  Chief Complaint    Chief Complaint  Patient presents with  . Shortness of Breath  . Cough  . Chest Pain  . Hypertension      HPI:   This is a 39 year old male with past medical history of severe systemic hypertension, hypertensive cardiomyopathy, combined systolic and diastolic heart failure (last EF 35 to 40% in 2017, sees Dr. Jamse Belfastroitoru [facility]), hyperlipidemia, morbid obesity who presented to the ED this a.m. with chest pain, shortness of breath and cough for the last 2 or 3 days.  Chest pain has been central and intermittent.  Has had associated dyspnea on exertion as well as a dry cough.  Also needing to elevate the head of his recliner where he sleeps more often.  Admitted to frequently eating fried foods.  Also has not had his heart failure medications for the past 2 months due to financial/insurance issues and has not seen his PCP for the same reasons.  ED Course: Vitals: T 98.6, HR 114, RR 22, BP 193/147 (BP at cardio office in October 181/123).  Notable labs: Glucose 118, creatinine 1.46 (at baseline), BNP 45, troponin 44-> 33, D-dimer 0.71, COVID-19 negative.  CXR: Cardiomegaly, central pulmonary vascular congestion and interstitial pulmonary edema.  In the ED he was restarted on his home Coreg, Entresto, spironolactone and given nitroglycerin as well as labetalol 10 mg x 1.  Vitals:   08/06/20 1630 08/06/20 1700  BP: (!) 179/148 (!) 181/117  Pulse: (!) 101 96  Resp: 20   Temp:    SpO2: 98% 99%     Review of Systems:  Review of Systems  Constitutional: Negative for chills and  fever.  HENT: Negative.   Respiratory: Positive for cough and shortness of breath. Negative for sputum production.   Cardiovascular: Positive for chest pain. Negative for palpitations and leg swelling.  Gastrointestinal: Negative for abdominal pain, nausea and vomiting.  Genitourinary: Negative for dysuria.  Musculoskeletal: Negative for myalgias.  Neurological: Negative.  Negative for weakness.  Psychiatric/Behavioral: Negative.   All other systems reviewed and are negative.   Medical/Social/Family History   Past Medical History: Past Medical History:  Diagnosis Date  . 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 Dilated aortic root ([facility])    seen on previous echo 02/2016  . Hyperlipidemia LDL goal <130 2015  . Hypertension   . Prediabetes     Past Surgical History:  Procedure Laterality Date  . HERNIA REPAIR    . LEFT HEART CATHETERIZATION WITH CORONARY ANGIOGRAM N/A 10/22/2014   Procedure: LEFT HEART CATHETERIZATION WITH CORONARY ANGIOGRAM;  Surgeon: Peter M SwazilandJordan, MD; Normal coronary anatomy. Vessels are very large due to hypertensive cardiomyopathy.     Medications: Prior to Admission medications   Medication Sig Start Date End Date Taking? Authorizing Provider  Icosapent Ethyl (VASCEPA) 1 g CAPS Take 2 capsules (  2 g total) by mouth 2 (two) times daily. 08/21/19  Yes Etta Grandchild, MD  atorvastatin (LIPITOR) 40 MG tablet Take 1 tablet (40 mg total) by mouth daily. 08/06/20 08/01/21  Milagros Loll, MD  carvedilol (COREG) 25 MG tablet Take 1 tablet (25 mg total) by mouth 2 (two) times daily. 08/06/20   Milagros Loll, MD  sacubitril-valsartan (ENTRESTO) 24-26 MG Take 1 tablet by mouth 2 (two) times daily. 08/06/20   Milagros Loll, MD  spironolactone (ALDACTONE) 25 MG tablet Take 1 tablet (25 mg total) by mouth daily. 08/06/20   Milagros Loll, MD    Allergies:  No Known Allergies  Social  History:  reports that he quit smoking about 5 years ago. He has never used smokeless tobacco. He reports current alcohol use. He reports that he does not use drugs.  Family History: Family History  Problem Relation Age of Onset  . Diabetes Mellitus II Mother   . Hypertension Mother   . Diabetes Mellitus II Maternal Grandmother      Objective   Physical Exam: Blood pressure (!) 181/117, pulse 96, temperature 98.6 F (37 C), temperature source Oral, resp. rate 20, height 6\' 1"  (1.854 m), weight (!) 149.7 kg, SpO2 99 %.  Physical Exam Vitals and nursing note reviewed.  Constitutional:      Appearance: Normal appearance.  HENT:     Head: Normocephalic and atraumatic.  Eyes:     Conjunctiva/sclera: Conjunctivae normal.  Cardiovascular:     Rate and Rhythm: Normal rate and regular rhythm.  Pulmonary:     Effort: Pulmonary effort is normal.     Breath sounds: Normal breath sounds.     Comments: Positive orthopnea Abdominal:     General: Abdomen is flat.     Palpations: Abdomen is soft.  Musculoskeletal:        General: No swelling or tenderness.     Right lower leg: No edema.     Left lower leg: No edema.  Skin:    Coloration: Skin is not jaundiced or pale.  Neurological:     Mental Status: He is alert. Mental status is at baseline.  Psychiatric:        Mood and Affect: Mood normal.        Behavior: Behavior normal.     LABS on Admission: I have personally reviewed all the labs and imaging below    Basic Metabolic Panel: Recent Labs  Lab 08/06/20 1144  NA 138  K 3.7  CL 104  CO2 24  GLUCOSE 118*  BUN 21*  CREATININE 1.46*  CALCIUM 8.8*   Liver Function Tests: No results for input(s): AST, ALT, ALKPHOS, BILITOT, PROT, ALBUMIN in the last 168 hours. No results for input(s): LIPASE, AMYLASE in the last 168 hours. No results for input(s): AMMONIA in the last 168 hours. CBC: Recent Labs  Lab 08/06/20 1144  WBC 6.9  HGB 15.3  HCT 46.3  MCV 87.7  PLT 287    Cardiac Enzymes: No results for input(s): CKTOTAL, CKMB, CKMBINDEX, TROPONINI in the last 168 hours. BNP: Invalid input(s): POCBNP CBG: No results for input(s): GLUCAP in the last 168 hours.  Radiological Exams on Admission:  DG Chest 2 View  Result Date: 08/06/2020 CLINICAL DATA:  Shortness of breath EXAM: CHEST - 2 VIEW COMPARISON:  11/11/2018 chest radiograph and prior. FINDINGS: Cardiomegaly with central pulmonary venous congestion. Diffuse interstitial prominence. No focal consolidation. No pneumothorax. Trace left pleural effusion. No acute osseous abnormalities. IMPRESSION:  Cardiomegaly, central pulmonary vascular congestion and interstitial pulmonary edema. Electronically Signed   By: Stana Bunting M.D.   On: 08/06/2020 08:46      EKG: Independently reviewed.  At baseline   A & P   Principal Problem:   CHF (congestive heart failure) (HCC) Active Problems:   Cardiomyopathy ([facility])   Prediabetes   Morbid obesity (HCC)   Chest pain   Hyperlipidemia   Positive D dimer    1. Mild combined heart failure exacerbation, likely multifactorial: Medication noncompliance and dietary indiscretion  Hypertensive cardiomyopathy a. Has not taken his medications for the past 2 months due to insurance/financial issues and is requesting assistance with this. Eats fried foods frequently -patient and his wife are both educated on proper diet b. Last echo in 2017 with EF 35 to 40% c. BNP near 500 d. Currently appears at/near euvolemia since receiving Lasix in the ED e. Nutrition consult for formal education f. Restart Home Meds: Carvedilol, Entresto, spironolactone g. Trend I's/O and daily weights h. SW consulted for medication assistance and assistance with helping get set up with a community health PCP i. Update echo j. Low sodium diet k. Could consider adding on Jardiance as this was recently approved for HFrEF treatment even in patients without diabetes according to the  EMPEROR-Reduced trial (if he can afford this)  2. Atypical chest pain likely secondary to heart failure exacerbation a. Afebrile, hypertensive and tolerating room air, currently chest pain free b. Troponin remained flat without concerning EKG changes c. Continue telemetry d. Repeat EKG in a.m.  3. Hypertensive urgency vs. Emergency a. BP up to 190/140s with minimal improvement with medications -this seems at/near baseline compared to October BP from cardio note b. Restart home meds c. Additional labetalol 10 mg x 1 d. Plan as above  4. Elevated D-dimer a. D-dimer: 0.71, No evidence of right heart strain on EKG b. In setting of atypical chest pain and tachycardia, PE is not ruled out -> CTA chest  5. Hyperlipidemia a. Lipid panel b. Continue atorvastatin, probably can increase pending lipid panel  6. Morbid Obesity Body mass index is 43.54 kg/m. a. Nutritionist consulted    DVT prophylaxis: SCDs   Code Status: Prior  Diet: low sodium Family Communication: Admission, patients condition and plan of care including tests being ordered have been discussed with the patient who indicates understanding and agrees with the plan and Code Status. Patient's wife was updated  Disposition Plan: The appropriate patient status for this patient is OBSERVATION. Observation status is judged to be reasonable and necessary in order to provide the required intensity of service to ensure the patient's safety. The patient's presenting symptoms, physical exam findings, and initial radiographic and laboratory data in the context of their medical condition is felt to place them at decreased risk for further clinical deterioration. Furthermore, it is anticipated that the patient will be medically stable for discharge from the hospital within 2 midnights of admission. The following factors support the patient status of observation.   " The patient's presenting symptoms include chest pain, DOE. " The physical  exam findings include dry cough. " The initial radiographic and laboratory data are pulmonary edema on CXR, elevated BNP.    Status is: Observation  The patient remains OBS appropriate and will d/c before 2 midnights.  Dispo: The patient is from: Home              Anticipated d/c is to: Home  Anticipated d/c date is: 1 day              Patient currently is not medically stable to d/c.    Consultants  . none  Procedures  . none  Time Spent on Admission: 64 minutes    Jae Dire, DO [facility] Pager [phone] 08/06/2020, 5:45 PM

## 2020-08-06 NOTE — Plan of Care (Signed)

## 2020-08-06 NOTE — ED Provider Notes (Addendum)
[facility]-EMERGENCY DEPT Provider Note   CSN: [id number] Arrival date & time: 08/06/20  0805     History Chief Complaint  Patient presents with  . Shortness of Breath  . Cough  . Chest Pain  . Hypertension    Steve Carrillo is a 39 y.o. male.  Presents here with concern for chest pain, shortness of breath, cough.  Patient reports that he has been having symptoms over the last 2 or 3 days.  Chest pain is mild, central, intermittent.  Not currently having pain. Not associated with exertion.  Shortness of breath is worse with exertion.  Cough started a couple days ago, nonproductive.  He reports that he has been out of his blood pressure medications for the past 2 months.  Patient has a history of hypertension, cardiomyopathy due to hypertension.  HPI     Past Medical History:  Diagnosis Date  . 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 Dilated aortic root (HCC)    seen on previous echo 02/2016  . Hyperlipidemia LDL goal <130 2015  . Hypertension   . Prediabetes     Patient Active Problem List   Diagnosis Date Noted  . Morbid obesity (HCC) 08/03/2016  . Essential hypertension, malignant 06/22/2016  . OSA (obstructive sleep apnea) 05/17/2016  . Diuretic-induced hypokalemia 03/14/2016  . Hyperlipidemia with target LDL less than 130 03/14/2016  . Prediabetes 02/20/2016  . Routine general medical examination at a health care facility 11/05/2014  . CHF (congestive heart failure) (HCC) 10/24/2014  . Cardiomyopathy (HCC) 10/21/2014  . Elevated serum creatinine 10/19/2014  . Prolonged Q-T interval on ECG 10/19/2014    Past Surgical History:  Procedure Laterality Date  . HERNIA REPAIR    . LEFT HEART CATHETERIZATION WITH CORONARY ANGIOGRAM N/A 10/22/2014   Procedure: LEFT HEART CATHETERIZATION WITH CORONARY ANGIOGRAM;  Surgeon: Peter M Sw[place], MD; Normal coronary anatomy. Vessels are very  large due to hypertensive cardiomyopathy.        Family History  Problem Relation Age of Onset  . Diabetes Mellitus II Mother   . Hypertension Mother   . Diabetes Mellitus II Maternal Grandmother     Social History   Tobacco Use  . Smoking status: Former Smoker    Quit date: 09/14/2014    Years since quitting: 5.8  . Smokeless tobacco: Never Used  Vaping Use  . Vaping Use: Never used  Substance Use Topics  . Alcohol use: Yes    Comment: Twice a month  . Drug use: No    Home Medications Prior to Admission medications   Medication Sig Start Date End Date Taking? Authorizing Provider  Icosapent Ethyl (VASCEPA) 1 g CAPS Take 2 capsules (2 g total) by mouth 2 (two) times daily. 08/21/19  Yes Etta Grandchild, MD  atorvastatin (LIPITOR) 40 MG tablet Take 1 tablet (40 mg total) by mouth daily. 08/06/20 08/01/21  Milagros Loll, MD  carvedilol (COREG) 25 MG tablet Take 1 tablet (25 mg total) by mouth 2 (two) times daily. 08/06/20   Milagros Loll, MD  sacubitril-valsartan (ENTRESTO) 24-26 MG Take 1 tablet by mouth 2 (two) times daily. 08/06/20   Milagros Loll, MD  spironolactone (ALDACTONE) 25 MG tablet Take 1 tablet (25 mg total) by mouth daily. 08/06/20   Milagros Loll, MD    Allergies    Patient has no known allergies.  Review of Systems  Review of Systems  Constitutional: Negative for chills and fever.  HENT: Negative for ear pain and sore throat.   Eyes: Negative for pain and visual disturbance.  Respiratory: Positive for cough and shortness of breath.   Cardiovascular: Positive for chest pain. Negative for palpitations.  Gastrointestinal: Negative for abdominal pain and vomiting.  Genitourinary: Negative for dysuria and hematuria.  Musculoskeletal: Negative for arthralgias and back pain.  Skin: Negative for color change and rash.  Neurological: Negative for seizures and syncope.  All other systems reviewed and are negative.   Physical Exam Updated Vital  Signs BP (!) 157/113   Pulse 99   Temp 98.6 F (37 C) (Oral)   Resp 18   Ht 6\' 1"  (1.854 m)   Wt (!) 149.7 kg   SpO2 96%   BMI 43.54 kg/m   Physical Exam Vitals and nursing note reviewed.  Constitutional:      Appearance: He is well-developed.  HENT:     Head: Normocephalic and atraumatic.  Eyes:     Conjunctiva/sclera: Conjunctivae normal.  Cardiovascular:     Rate and Rhythm: Normal rate and regular rhythm.     Heart sounds: No murmur heard.   Pulmonary:     Effort: No respiratory distress.     Breath sounds: Normal breath sounds.     Comments: Mild rales at bases Abdominal:     Palpations: Abdomen is soft.     Tenderness: There is no abdominal tenderness.  Musculoskeletal:     Cervical back: Neck supple.  Skin:    General: Skin is warm and dry.     Capillary Refill: Capillary refill takes less than 2 seconds.  Neurological:     General: No focal deficit present.     Mental Status: He is alert and oriented to person, place, and time.  Psychiatric:        Mood and Affect: Mood normal.        Behavior: Behavior normal.     ED Results / Procedures / Treatments   Labs (all labs ordered are listed, but only abnormal results are displayed) Labs Reviewed  BASIC METABOLIC PANEL - Abnormal; Notable for the following components:      Result Value   Glucose, Bld 118 (*)    BUN 21 (*)    Creatinine, Ser 1.46 (*)    Calcium 8.8 (*)    All other components within normal limits  BRAIN NATRIURETIC PEPTIDE - Abnormal; Notable for the following components:   B Natriuretic Peptide 485.5 (*)    All other components within normal limits  D-DIMER, QUANTITATIVE (NOT AT [facility]) - Abnormal; Notable for the following components:   D-Dimer, Quant 0.71 (*)    All other components within normal limits  TROPONIN I (HIGH SENSITIVITY) - Abnormal; Notable for the following components:   Troponin I (High Sensitivity) 44 (*)    All other components within normal limits  TROPONIN I (HIGH  SENSITIVITY) - Abnormal; Notable for the following components:   Troponin I (High Sensitivity) 33 (*)    All other components within normal limits  SARS CORONAVIRUS 2 BY RT PCR [facility] ORDER, PERFORMED IN [facility])  CBC    EKG EKG Interpretation  Date/Time:  Friday August 06 2020 08:14:43 EDT Ventricular Rate:  115 PR Interval:    QRS Duration: 108 QT Interval:  349 QTC Calculation: 483 R Axis:   18 Text Interpretation: Sinus tachycardia Biatrial enlargement Nonspecific T abnormalities, lateral leads Borderline prolonged QT interval  12 Lead; Mason-Likar Confirmed by Marianna Fussykstra, Jaggar Benko ([id number]) on 08/06/2020 8:27:19 AM   Radiology DG Chest 2 View  Result Date: 08/06/2020 CLINICAL DATA:  Shortness of breath EXAM: CHEST - 2 VIEW COMPARISON:  11/11/2018 chest radiograph and prior. FINDINGS: Cardiomegaly with central pulmonary venous congestion. Diffuse interstitial prominence. No focal consolidation. No pneumothorax. Trace left pleural effusion. No acute osseous abnormalities. IMPRESSION: Cardiomegaly, central pulmonary vascular congestion and interstitial pulmonary edema. Electronically Signed   By: Stana Buntinghikanele  Emekauwa M.D.   On: 08/06/2020 08:46    Procedures Procedures (including critical care time)  Medications Ordered in ED Medications  carvedilol (COREG) tablet 25 mg (25 mg Oral Given 08/06/20 1152)  sacubitril-valsartan (ENTRESTO) 24-26 mg per tablet (1 tablet Oral Given 08/06/20 1534)  spironolactone (ALDACTONE) tablet 25 mg (25 mg Oral Given 08/06/20 1534)  labetalol (NORMODYNE) injection 10 mg (10 mg Intravenous Given 08/06/20 1243)  furosemide (LASIX) injection 40 mg (40 mg Intravenous Given 08/06/20 1311)  nitroGLYCERIN (NITROSTAT) SL tablet 0.4 mg (0.4 mg Sublingual Given 08/06/20 1312)    ED Course  I have reviewed the triage vital signs and the nursing notes.  Pertinent labs & imaging results that were available during my care of the patient were reviewed by me  and considered in my medical decision making (see chart for details).    MDM Rules/Calculators/A&P                          39 year old male who presents to ER with concern for chest pain, shortness of breath, cough, high blood pressure.  Patient has history of difficult to control hypertension, nonischemic cardiomyopathy. On exam, patient is well appearing in no distress.  No acute ischemic changes, his troponin is mildly elevated but based on review of chart, I suspect this is chronic.  No active chest pain and repeat troponin stable.  BNP is elevated and CXR shows cardiomegaly with increased vascular congestion.  D-dimer was initially ordered as part of broad work-up for undifferentiated chest pain, shortness of breath; however after more detailed history, chest x-ray, BNP, I have a very low suspicion for acute pulmonary embolism and given the alternative diagnosis is much more likely, do not feel patient requires CT imaging to rule out PE today.  Patient was provided Lasix, some blood pressure control.  He reports his symptoms improved.  However his repeat blood pressures were still elevated.  Given his history, persistently elevated blood pressures, offered  admission for close observation, titration of blood pressures and IV diuresis. Discussed risks/benefits. He wishes to pursue outpatient therapy with his home medications and is agreeable to close follow-up with his primary doctor and cardiologist.  Will provide his home med prescription, stressed need for close outpatient follow-up and need to return should his symptoms worsen.  Initially discharge patient per request however at time of signout, patient changed his mind.  Please refer to Dr. Sandi Carneelo's note for final plan and disposition.  Either admission or discharge with close outpatient follow-up.  Final Clinical Impression(s) / ED Diagnoses Final diagnoses:  Hypertension, unspecified type  Heart failure, unspecified HF chronicity, unspecified  heart failure type (HCC)    Rx / DC Orders ED Discharge Orders         Ordered    sacubitril-valsartan (ENTRESTO) 24-26 MG  2 times daily        08/06/20 1538    carvedilol (COREG) 25 MG tablet  2 times daily  08/06/20 1538    atorvastatin (LIPITOR) 40 MG tablet  Daily        08/06/20 1538    spironolactone (ALDACTONE) 25 MG tablet  Daily        08/06/20 1538           Milagros Loll, MD 08/06/20 1532    Milagros Loll, MD 08/06/20 1539

## 2020-08-06 NOTE — ED Notes (Signed)
Dr. Stevie Kern aware pts BP 187/101, provider aware of pts recent elevated BP's.

## 2020-08-06 NOTE — Discharge Instructions (Addendum)
Please take your blood pressure medicine as prescribed.  Please get a close follow-up appointment with your primary doctor and your cardiologist.  If you have any worsening of your breathing, recurrent chest pain, fever, please return to ER for reassessment.

## 2020-08-06 NOTE — ED Triage Notes (Addendum)
Patient c/o constant chest pain, SOB, and a cough since 0100 today.  Patient's BP in triage- 193/147.  Patient states he has been out of his meds for a few months.

## 2020-08-07 ENCOUNTER — Observation Stay (HOSPITAL_COMMUNITY): Payer: Self-pay

## 2020-08-07 DIAGNOSIS — I5043 Acute on chronic combined systolic (congestive) and diastolic (congestive) heart failure: Secondary | ICD-10-CM

## 2020-08-07 DIAGNOSIS — I509 Heart failure, unspecified: Secondary | ICD-10-CM | POA: Insufficient documentation

## 2020-08-07 DIAGNOSIS — R9389 Abnormal findings on diagnostic imaging of other specified body structures: Secondary | ICD-10-CM

## 2020-08-07 DIAGNOSIS — E876 Hypokalemia: Secondary | ICD-10-CM

## 2020-08-07 LAB — HEMOGLOBIN A1C
Hgb A1c MFr Bld: 5.7 % — ABNORMAL HIGH (ref 4.8–5.6)
Mean Plasma Glucose: 116.89 mg/dL

## 2020-08-07 LAB — ECHOCARDIOGRAM COMPLETE
Area-P 1/2: 6.83 cm2
Calc EF: 25.1 %
Height: 73 in
MV M vel: 4.54 m/s
MV Peak grad: 82.4 mmHg
S' Lateral: 6.5 cm
Single Plane A2C EF: 17.5 %
Single Plane A4C EF: 24.6 %
Weight: 5326.31 oz

## 2020-08-07 LAB — HIV ANTIBODY (ROUTINE TESTING W REFLEX): HIV Screen 4th Generation wRfx: NONREACTIVE

## 2020-08-07 LAB — BASIC METABOLIC PANEL
Anion gap: 8 (ref 5–15)
BUN: 19 mg/dL (ref 6–20)
CO2: 25 mmol/L (ref 22–32)
Calcium: 8.9 mg/dL (ref 8.9–10.3)
Chloride: 105 mmol/L (ref 98–111)
Creatinine, Ser: 1.4 mg/dL — ABNORMAL HIGH (ref 0.61–1.24)
GFR calc Af Amer: >60 mL/min (ref >60)
GFR calc non Af Amer: >60 mL/min (ref >60)
Glucose, Bld: 111 mg/dL — ABNORMAL HIGH (ref 70–99)
Potassium: 3.4 mmol/L — ABNORMAL LOW (ref 3.5–5.1)
Sodium: 138 mmol/L (ref 135–145)

## 2020-08-07 LAB — TROPONIN I (HIGH SENSITIVITY): Troponin I (High Sensitivity): 28 ng/L — ABNORMAL HIGH (ref <18)

## 2020-08-07 LAB — TSH: TSH: 1.728 u[IU]/mL (ref 0.350–4.500)

## 2020-08-07 MED ORDER — FUROSEMIDE 10 MG/ML IJ SOLN
40.0000 mg | Freq: Once | INTRAMUSCULAR | Status: AC
  Administered 2020-08-07: 40 mg via INTRAVENOUS
  Filled 2020-08-07: qty 4

## 2020-08-07 MED ORDER — LABETALOL HCL 5 MG/ML IV SOLN
10.0000 mg | INTRAVENOUS | Status: DC | PRN
  Administered 2020-08-09: 10 mg via INTRAVENOUS
  Filled 2020-08-07: qty 4

## 2020-08-07 MED ORDER — HYDRALAZINE HCL 25 MG PO TABS
25.0000 mg | ORAL_TABLET | ORAL | Status: DC | PRN
  Administered 2020-08-09 – 2020-08-10 (×2): 25 mg via ORAL
  Filled 2020-08-07 (×2): qty 1

## 2020-08-07 MED ORDER — ENOXAPARIN SODIUM 40 MG/0.4ML ~~LOC~~ SOLN
40.0000 mg | SUBCUTANEOUS | Status: DC
  Administered 2020-08-07 – 2020-08-09 (×3): 40 mg via SUBCUTANEOUS
  Filled 2020-08-07 (×3): qty 0.4

## 2020-08-07 NOTE — Assessment & Plan Note (Signed)
-  Due to noncompliance with home meds from loss of insurance -Continue Coreg and [place] -Use labetalol or hydralazine as needed -will add additional agents as needed

## 2020-08-07 NOTE — Assessment & Plan Note (Signed)
-   replete and recheck as needed 

## 2020-08-07 NOTE — Assessment & Plan Note (Addendum)
-   considered due to CHF exac and hypertensive urgency/emergency - CTA negative for PE - trop downtrending; EKG reviewed, no ischemic changes - repeat EKG if worsening CP

## 2020-08-07 NOTE — Progress Notes (Signed)
Patient went with wife downstairs to cafeteria and then went outside for a walk.  Patient and wife off the unit from 1300-1600.  Security notified at 13:10 when RN could not locate patient in room or on unit.  Patient and wife informed upon return not to leave the floor due to patient on telemetry monitor.  Patient and wife apologized and both did not know patients could not leave unit floor. Both patient and wife verbalizes understanding in agree for patient not to leave the floor again.

## 2020-08-07 NOTE — Assessment & Plan Note (Addendum)
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 Separate 0.5 cm sub solid nodule in the right lower lobe. These may represent areas of slightly more confluent edema are nonspecific.  - Based on current guidelines, CT chest without contrast is recommended in 6 months in order to assess for persistence.

## 2020-08-07 NOTE — Assessment & Plan Note (Addendum)
2017 echo: EF 35-40%, Gr 2 DD - follow up repeat echo: EF further reduced, 20 to 25% and now grade 3 diastolic dysfunction.  Not surprising with ongoing medication noncompliance at home due to financial constraints unfortunately -Given further worsening of EF, will reengage cardiology while patient hospitalized.  Now at higher risk for spontaneous arrhythmia; may need consideration for AICD however suspect he may need further trial of medication compliance to see if EF responds again.  He does need significant medication assistance especially with Entresto - appreciate cardiology consult; patient tentatively planned for LHC/RHC on Tuesday given worsened EF/ischemic evaluation  -patient has been resumed on entresto (increased further per cardiology), coreg, spironolactone (doubtful for entresto affordability at discharge so likely plan on giving losartan script instead) - continue lipitor - continue lasix daily per cardiology - strict I&O; has not been measured, discussed again with patient and staff - SW consult for financial assistance at discharge; will need to follow up with [facility] -Consideration of starting Jardiance, however will await further plans especially given financial constraints with medications

## 2020-08-07 NOTE — Assessment & Plan Note (Addendum)
-   continue lipitor -CHFL 149, LDL 107, TG 74 -In the past was also on Vascepa per cardiology

## 2020-08-07 NOTE — Progress Notes (Signed)
PROGRESS NOTE    TYRIK Carrillo   LKT:[id number]  DOB: [birth year]  DOA: 08/06/2020     0  PCP: Etta Grandchild, MD  CC: SOB, chest tightness  Hospital Course: Steve Carrillo is a 39 yo AA male with PMH combined systolic diastolic CHF (previous EF 35-40%, Gr II DD on March 2017 echo), CKDII, obesity, mild dilated aortic root, HTN, HLD, prediabetes who presented to the hospital with tightness in his chest, SOB, and overall feeling bad. He has lost his insurance recently and has been out of medications for approximately 2 months he stated.  He was found to have uncontrolled BP on workup in the ER 193/147 with tachycardia and tachypnea.  He had an elevated d-dimer 0.71 and trop 33, thus underwent CTA to rule out PE which was negative for PE but did show small bilateral pleural effusions, moderate cardiomegaly, and pulmonary venous hypertension with interstitial edema.  COVID-19 negative  Last cardiology note reviewed from Dr. Royann Shivers on 08/27/19.  He has previously undergone a sleep study which showed mild OSA and CPAP was not recommended. 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  His BNP was also elevated 485 (not likely to be due to [facility] given off med ~2 mths) and given his presentation he was admitted for a CHF exacerbation.  He was given Lasix 40 mg IV in the ER but does not think he voided a significant amount from this dose. He also does not weigh himself routinely at home nor check his blood pressure.  Does not have a cuff or scale but states that he plans to get them at discharge if needed.   Interval History:  Admitted yesterday with worsening shortness of breath at home and some chest tightness.  Found to have worsening of his CHF.  This morning he is breathing a little better and chest does not feel as tight.  Does not know his usual weight as he does not weigh himself at home. Wife also present bedside this  morning.  He has been significantly noncompliant with medications and has run out recently due to loss of insurance.  He wants to get plugged back in with a physician and getting back on his medications.  Old records reviewed in assessment of this patient  ROS: Constitutional: negative for chills and fevers, Respiratory: negative for cough, Cardiovascular: positive for chest pressure/discomfort, dyspnea and exertional chest pressure/discomfort, negative for chest pain and Gastrointestinal: negative for abdominal pain  Assessment & Plan: Acute on chronic combined systolic and diastolic CHF (congestive heart failure) (HCC) 2017 echo: EF 35-40%, Gr 2 DD - follow up repeat echo: EF further reduced, 20 to 25% and now grade 3 diastolic dysfunction.  Not unsurprising with ongoing medication noncompliance at home due to financial constraints unfortunately -Given further worsening of EF, will reengage cardiology while patient hospitalized.  Now at higher risk for spontaneous arrhythmia; may need consideration for AICD however suspect he may need further trial of medication compliance to see if EF responds again.  He does need significant medication assistance especially with Entresto -patient has been resumed on entresto, coreg, spironolactone - continue lipitor - lasix PRN.  Given worsening EF, will give repeat dose today and monitor output response - strict I&O - SW consult for financial assistance at discharge -Consideration of starting Jardiance, however will await further plans especially given financial constraints with medications  Chest pain - considered due to CHF exac and hypertensive urgency/emergency -  CTA negative for PE - trop downtrending; EKG reviewed, no ischemic changes - repeat EKG if worsening CP -Repeat troponin this morning  Hyperlipidemia - continue lipitor -CHFL 149, LDL 107, TG 74 -In the past was also on Vascepa per cardiology  Morbid obesity (HCC) - Body mass index  is 43.54 kg/m. 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 Separate 0.5 cm sub solid nodule in the right lower lobe. These may represent areas of slightly more confluent edema are nonspecific.  - Based on current guidelines, CT chest without contrast is recommended in 6 months in order to assess for persistence.  Prediabetes - repeat A1c  Hypokalemia - replete and recheck as needed  Chronic kidney disease, stage 2, mildly decreased GFR - baseline creatinine 1.4-1.6, currently at baseline. Likely due to HTN - avoid nephrotoxic agents as able   Hypertensive urgency, malignant -Due to noncompliance with home meds from loss of insurance -Continue Coreg and Entresto -Use labetalol or hydralazine as needed -will add additional agents as needed  Essential hypertension, malignant -See hypertensive urgency - will continue on chronic regimen once blood pressure stabilized  OSA (obstructive sleep apnea) -Per cardiology notes, has had a sleep study in the past and was found to have mild OSA with no indication for CPAP -May need repeat sleep study outpatient at some point   Antimicrobials: None  DVT prophylaxis: Lovenox Code Status: Full Family Communication: Wife bedside Disposition Plan: Status is: Inpatient  Remains inpatient appropriate because:IV treatments appropriate due to intensity of illness or inability to take PO and Inpatient level of care appropriate due to severity of illness   Dispo: The patient is from: Home              Anticipated d/c is to: Home              Anticipated d/c date is: 2 days              Patient currently is not medically stable to d/c.       Objective: Blood pressure (!) 151/100, pulse 85, temperature 98.2 F (36.8 C), temperature source Oral, resp.  rate (!) 22, height 6\' 1"  (1.854 m), weight (!) 151 kg, SpO2 97 %.  Examination: General appearance: alert, cooperative, no distress and morbidly obese Head: Normocephalic, without obvious abnormality, atraumatic Eyes: EOMI Lungs: clear to auscultation bilaterally Heart: regular rate and rhythm and S1, S2 normal Abdomen: obese, soft, NT, ND, BS present Extremities: no obvious edema Skin: mobility and turgor normal Neurologic: Grossly normal  Consultants:   none  Procedures:   08/07/20, TTE  Data Reviewed: I have personally reviewed following labs and imaging studies Results for orders placed or performed during the hospital encounter of 08/06/20 (from the past 24 hour(s))  HIV Antibody (routine testing w rflx)     Status: None   Collection Time: 08/07/20  5:57 AM  Result Value Ref Range   HIV Screen 4th Generation wRfx Non Reactive Non Reactive  Basic metabolic panel     Status: Abnormal   Collection Time: 08/07/20  5:57 AM  Result Value Ref Range   Sodium 138 135 - 145 mmol/L   Potassium 3.4 (L) 3.5 - 5.1 mmol/L   Chloride 105 98 - 111 mmol/L   CO2 25 22 - 32 mmol/L  Glucose, Bld 111 (H) 70 - 99 mg/dL   BUN 19 6 - 20 mg/dL   Creatinine, Ser 1.61 (H) 0.61 - 1.24 mg/dL   Calcium 8.9 8.9 - 09.6 mg/dL   GFR calc non Af Amer >60 >60 mL/min   GFR calc Af Amer >60 >60 mL/min   Anion gap 8 5 - 15    Recent Results (from the past 240 hour(s))  SARS Coronavirus 2 by RT PCR (hospital order, performed in [facility] hospital lab) Nasopharyngeal Nasopharyngeal Swab     Status: None   Collection Time: 08/06/20 11:44 AM   Specimen: Nasopharyngeal Swab  Result Value Ref Range Status   SARS Coronavirus 2 NEGATIVE NEGATIVE Final    Comment: (NOTE) SARS-CoV-2 target nucleic acids are NOT DETECTED.  The SARS-CoV-2 RNA is generally detectable in upper and lower respiratory specimens during the acute phase of infection. The lowest concentration of SARS-CoV-2 viral copies this assay can  detect is 250 copies / mL. A negative result does not preclude SARS-CoV-2 infection and should not be used as the sole basis for treatment or other patient management decisions.  A negative result may occur with improper specimen collection / handling, submission of specimen other than nasopharyngeal swab, presence of viral mutation(s) within the areas targeted by this assay, and inadequate number of viral copies (<250 copies / mL). A negative result must be combined with clinical observations, patient history, and epidemiological information.  Fact Sheet for Patients:   BoilerBrush.com.cy  Fact Sheet for Healthcare Providers: https://pope.com/  This test is not yet approved or  cleared by the [place] FDA and has been authorized for detection and/or diagnosis of SARS-CoV-2 by FDA under an Emergency Use Authorization (EUA).  This EUA will remain in effect (meaning this test can be used) for the duration of the COVID-19 declaration under Section 564(b)(1) of the Act, 21 U.S.C. section 360bbb-3(b)(1), unless the authorization is terminated or revoked sooner.  Performed at [facility], [address] [place], [place] [address]      Radiology Studies: DG Chest 2 View  Result Date: 08/06/2020 CLINICAL DATA:  Shortness of breath EXAM: CHEST - 2 VIEW COMPARISON:  11/11/2018 chest radiograph and prior. FINDINGS: Cardiomegaly with central pulmonary venous congestion. Diffuse interstitial prominence. No focal consolidation. No pneumothorax. Trace left pleural effusion. No acute osseous abnormalities. IMPRESSION: Cardiomegaly, central pulmonary vascular congestion and interstitial pulmonary edema. Electronically Signed   By: Stana Bunting M.D.   On: 08/06/2020 08:46   CT ANGIO CHEST PE W OR WO CONTRAST  Result Date: 08/06/2020 CLINICAL DATA:  Elevated D-dimer. Chest pain and shortness of breath EXAM: CT ANGIOGRAPHY  CHEST WITH CONTRAST TECHNIQUE: Multidetector CT imaging of the chest was performed using the standard protocol during bolus administration of intravenous contrast. Multiplanar CT image reconstructions and MIPs were obtained to evaluate the vascular anatomy. CONTRAST:  OMNIPAQUE IOHEXOL 350 MG/ML SOLN COMPARISON:  Chest radiograph 08/06/2020 FINDINGS: Cardiovascular: No filling defect is identified in the pulmonary arterial tree to suggest pulmonary embolus. Moderate cardiomegaly especially involving the left heart. Mediastinum/Nodes: Prevascular node 1.0 cm in short axis on image 45/4. Suspected additional mildly enlarged paratracheal and subcarinal lymph nodes, although indistinctly marginated. Lungs/Pleura: Small bilateral pleural effusions, nonspecific for transudative or exudative etiology. Subsegmental atelectasis or scarring anteriorly in the left lower lobe. Prominence of vessels favoring pulmonary venous hypertension Ground-glass density posteriorly in the right upper lobe measuring 1.5 by 0.9 cm on image 65/6. Sub solid right lower lobe nodule  0.5 cm in diameter on image 89/6. Subtle interstitial accentuation. Upper Abdomen: Unremarkable Musculoskeletal: Unremarkable Review of the MIP images confirms the above findings. IMPRESSION: 1. No filling defect is identified in the pulmonary arterial tree to suggest pulmonary embolus. 2. Moderate cardiomegaly especially involving the left heart. Pulmonary venous hypertension and faint interstitial edema. 3. Small bilateral pleural effusions, nonspecific for transudative or exudative etiology. 4. 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 Separate 0.5 cm sub solid nodule in the right lower lobe. These may represent areas of slightly more confluent edema are nonspecific. Based on current guidelines, CT chest without contrast is recommended in 6 months in order to assess for persistence. 5.  Subsegmental atelectasis or scarring anteriorly in the left lower lobe. Electronically Signed   By: Gaylyn Rong M.D.   On: 08/06/2020 18:18   ECHOCARDIOGRAM COMPLETE  Result Date: 08/07/2020    ECHOCARDIOGRAM REPORT   Patient Name:   Steve Carrillo Date of Exam: 08/07/2020 Medical Rec #:  [id number]      Height:       73.0 in Accession #:    [id number]     Weight:       330.0 lb Date of Birth:  [birth year]     BSA:          2.662 m Patient Age:    38 years       BP:           141/95 mmHg Patient Gender: M              HR:           90 bpm. Exam Location:  Inpatient Procedure: 2D Echo, Cardiac Doppler and Color Doppler Indications:    Congestive Heart Failure 428.0 / I50.9  History:        Patient has prior history of Echocardiogram examinations, most                 recent 02/07/2016. CHF and Cardiomyopathy, Signs/Symptoms:Chest                 Pain; Risk Factors:Hypertension, Dyslipidemia and Former Smoker.                 Dilated aortic root.  Sonographer:    Renella Cunas RDCS Referring Phys: [id number] JARED E SEGAL IMPRESSIONS  1. Left ventricular ejection fraction, by estimation, is 20 to 25%. The left ventricle has severely decreased function. The left ventricle demonstrates global hypokinesis. The left ventricular internal cavity size was severely dilated. There is mild left ventricular hypertrophy. Left ventricular diastolic parameters are consistent with Grade III diastolic dysfunction (restrictive). Elevated left atrial pressure.  2. Right ventricular systolic function is normal. The right ventricular size is normal. Tricuspid regurgitation signal is inadequate for assessing PA pressure.  3. The mitral valve is normal in structure. Mild mitral valve regurgitation.  4. The aortic valve was not well visualized. Aortic valve regurgitation is mild. No aortic stenosis is present.  5. Aortic dilatation noted. There is mild dilatation of the aortic root measuring 40 mm.  6. The inferior vena cava is dilated in  size with <50% respiratory variability, suggesting right atrial pressure of 15 mmHg. FINDINGS  Left Ventricle: Left ventricular ejection fraction, by estimation, is 20 to 25%. The left ventricle has severely decreased function. The left ventricle demonstrates global hypokinesis. The left ventricular internal cavity size was severely dilated. There is mild left ventricular hypertrophy.  Left ventricular diastolic parameters are consistent with Grade III diastolic dysfunction (restrictive). Elevated left atrial pressure. Right Ventricle: The right ventricular size is normal. Right vetricular wall thickness was not assessed. Right ventricular systolic function is normal. Tricuspid regurgitation signal is inadequate for assessing PA pressure. Left Atrium: Left atrial size was normal in size. Right Atrium: Right atrial size was normal in size. Pericardium: Trivial pericardial effusion is present. Mitral Valve: The mitral valve is normal in structure. Mild mitral valve regurgitation. Tricuspid Valve: The tricuspid valve is normal in structure. Tricuspid valve regurgitation is trivial. Aortic Valve: The aortic valve was not well visualized. Aortic valve regurgitation is mild. No aortic stenosis is present. Pulmonic Valve: The pulmonic valve was not well visualized. Pulmonic valve regurgitation is trivial. Aorta: Aortic dilatation noted. There is mild dilatation of the aortic root measuring 40 mm. Venous: The inferior vena cava is dilated in size with less than 50% respiratory variability, suggesting right atrial pressure of 15 mmHg. IAS/Shunts: The interatrial septum was not well visualized.  LEFT VENTRICLE PLAX 2D LVIDd:         7.20 cm      Diastology LVIDs:         6.50 cm      LV e' lateral:   7.80 cm/s LV PW:         1.10 cm      LV E/e' lateral: 11.5 LV IVS:        1.10 cm      LV e' medial:    6.05 cm/s LVOT diam:     2.50 cm      LV E/e' medial:  14.8 LV SV:         66 LV SV Index:   25 LVOT Area:     4.91 cm  LV  Volumes (MOD) LV vol d, MOD A2C: 297.0 ml LV vol d, MOD A4C: 309.0 ml LV vol s, MOD A2C: 245.0 ml LV vol s, MOD A4C: 233.0 ml LV SV MOD A2C:     52.0 ml LV SV MOD A4C:     309.0 ml LV SV MOD BP:      80.0 ml RIGHT VENTRICLE RV S prime:     12.40 cm/s TAPSE (M-mode): 2.1 cm LEFT ATRIUM             Index       RIGHT ATRIUM           Index LA diam:        5.40 cm 2.03 cm/m  RA Area:     14.90 cm LA Vol (A2C):   63.8 ml 23.97 ml/m RA Volume:   37.10 ml  13.94 ml/m LA Vol (A4C):   66.0 ml 24.79 ml/m LA Biplane Vol: 65.4 ml 24.57 ml/m  AORTIC VALVE LVOT Vmax:   96.00 cm/s LVOT Vmean:  67.900 cm/s LVOT VTI:    0.135 m  AORTA Ao Root diam: 4.00 cm MITRAL VALVE MV Area (PHT): 6.83 cm    SHUNTS MV Decel Time: 111 msec    Systemic VTI:  0.14 m MR Peak grad: 82.4 mmHg    Systemic Diam: 2.50 cm MR Vmax:      454.00 cm/s MV E velocity: 89.50 cm/s MV A velocity: 38.60 cm/s MV E/A ratio:  2.32 Epifanio Lesches MD Electronically signed by Epifanio Lesches MD Signature Date/Time: 08/07/2020/1:44:19 PM    Final    CT ANGIO CHEST PE W OR WO CONTRAST  Final Result    DG  Chest 2 View  Final Result      Scheduled Meds: . atorvastatin  40 mg Oral Daily  . carvedilol  25 mg Oral BID WC  . sacubitril-valsartan  1 tablet Oral BID  . sodium chloride flush  3 mL Intravenous Q12H  . spironolactone  25 mg Oral Daily   PRN Meds: sodium chloride, acetaminophen, hydrALAZINE, labetalol, sodium chloride flush Continuous Infusions: . sodium chloride        LOS: 0 days  Time spent: Greater than 50% of the 35 minute visit was spent in counseling/coordination of care for the patient as laid out in the A&P.   Lewie Chamber, MD [facility] 08/07/2020, 4:03 PM  Contact via secure chat.  To contact the attending provider between 7A-7P or the covering provider during after hours 7P-7A, please log into the web site [email or web address] and access using universal [place] password for that web site. If you do not  have the password, please call the hospital operator.

## 2020-08-07 NOTE — Assessment & Plan Note (Addendum)
-  Per cardiology notes, has had a sleep study in the past and was found to have mild OSA with no indication for CPAP -May need repeat sleep study outpatient at some point

## 2020-08-07 NOTE — Assessment & Plan Note (Signed)
-  See hypertensive urgency - will continue on chronic regimen once blood pressure stabilized

## 2020-08-07 NOTE — Progress Notes (Signed)
  Echocardiogram 2D Echocardiogram has been performed.  Steve Carrillo 08/07/2020, 11:33 AM

## 2020-08-07 NOTE — Plan of Care (Signed)
Nutrition Education Note  RD consulted for nutrition education regarding CHF.  39 year old male with past medical history of severe systemic hypertension, hypertensive cardiomyopathy, combined systolic and diastolic heart failure (last EF 35 to 40% in 2017, sees Dr. Jamse Belfast), hyperlipidemia, morbid obesity who is admitted with CHF.  RD provided "Low Sodium Nutrition Therapy" handout from the Academy of [employer] and Dietetics. Reviewed patient's dietary recall. Provided examples on ways to decrease sodium intake in diet. Discouraged intake of processed foods and use of salt shaker. Encouraged fresh fruits and vegetables as well as whole grain sources of carbohydrates to maximize fiber intake.   RD discussed why it is important for patient to adhere to diet recommendations, and emphasized the role of fluids, foods to avoid, and importance of weighing self daily. Teach back method used.  Expect poor compliance.  Body mass index is 43.92 kg/m. Pt meets criteria for morbid obeseity based on current BMI.  Current diet order is 2 gram sodium restricted, patient is consuming approximately 100% of meals at this time. Labs and medications reviewed. No further nutrition interventions warranted at this time. RD contact information provided. If additional nutrition issues arise, please re-consult RD.   Betsey Holiday MS, RD, LDN Please refer to [facility] for RD and/or RD on-call/weekend/after hours pager

## 2020-08-07 NOTE — Assessment & Plan Note (Signed)
-   baseline creatinine 1.4-1.6, currently at baseline. Likely due to HTN - avoid nephrotoxic agents as able

## 2020-08-07 NOTE — Assessment & Plan Note (Addendum)
-   repeat A1c = 5.7%

## 2020-08-07 NOTE — Assessment & Plan Note (Signed)
-   Body mass index is 43.54 kg/m. - RD consulted for further patient education  - patient does not follow heart healthy diet

## 2020-08-07 NOTE — Hospital Course (Addendum)
Steve Carrillo is a 39 yo AA male with PMH combined systolic diastolic CHF (previous EF 35-40%, Gr II DD on March 2017 echo), CKDII, obesity, mild dilated aortic root, HTN, HLD, prediabetes who presented to the hospital with tightness in his chest, SOB, and overall feeling bad. He has lost his insurance recently and has been out of medications for approximately 2 months he stated.  He was found to have uncontrolled BP on workup in the ER 193/147 with tachycardia and tachypnea.  He had an elevated d-dimer 0.71 and trop 33, thus underwent CTA to rule out PE which was negative for PE but did show small bilateral pleural effusions, moderate cardiomegaly, and pulmonary venous hypertension with interstitial edema.  COVID-19 negative  Last cardiology note reviewed from Dr. Royann Shivers on 08/27/19.  He has previously undergone a sleep study which showed mild OSA and CPAP was not recommended. 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  His BNP was also elevated 485 (not likely to be due to [facility] given off med ~2 mths) and given his presentation he was admitted for a CHF exacerbation.  He was given Lasix 40 mg IV in the ER but does not think he voided a significant amount from this dose. He also does not weigh himself routinely at home nor check his blood pressure.  Does not have a cuff or scale but states that he plans to get them at discharge if needed.  Cardiology was consulted during hospitalization and recommended LHC/RHC to evaluate for ischemic workup and due to his worsening EF on echo (20-25%). Planned for 08/10/20.

## 2020-08-08 DIAGNOSIS — I160 Hypertensive urgency: Secondary | ICD-10-CM

## 2020-08-08 DIAGNOSIS — I5043 Acute on chronic combined systolic (congestive) and diastolic (congestive) heart failure: Secondary | ICD-10-CM

## 2020-08-08 DIAGNOSIS — I248 Other forms of acute ischemic heart disease: Secondary | ICD-10-CM

## 2020-08-08 LAB — CBC WITH DIFFERENTIAL/PLATELET
Abs Immature Granulocytes: 0.02 10*3/uL (ref 0.00–0.07)
Basophils Absolute: 0.1 10*3/uL (ref 0.0–0.1)
Basophils Relative: 1 %
Eosinophils Absolute: 0.3 10*3/uL (ref 0.0–0.5)
Eosinophils Relative: 5 %
HCT: 43.2 % (ref 39.0–52.0)
Hemoglobin: 14 g/dL (ref 13.0–17.0)
Immature Granulocytes: 0 %
Lymphocytes Relative: 38 %
Lymphs Abs: 2.3 10*3/uL (ref 0.7–4.0)
MCH: 29 pg (ref 26.0–34.0)
MCHC: 32.4 g/dL (ref 30.0–36.0)
MCV: 89.6 fL (ref 80.0–100.0)
Monocytes Absolute: 0.6 10*3/uL (ref 0.1–1.0)
Monocytes Relative: 11 %
Neutro Abs: 2.8 10*3/uL (ref 1.7–7.7)
Neutrophils Relative %: 45 %
Platelets: 269 10*3/uL (ref 150–400)
RBC: 4.82 MIL/uL (ref 4.22–5.81)
RDW: 13.4 % (ref 11.5–15.5)
WBC: 6.1 10*3/uL (ref 4.0–10.5)
nRBC: 0 % (ref 0.0–0.2)

## 2020-08-08 LAB — BASIC METABOLIC PANEL
Anion gap: 11 (ref 5–15)
BUN: 21 mg/dL — ABNORMAL HIGH (ref 6–20)
CO2: 26 mmol/L (ref 22–32)
Calcium: 9 mg/dL (ref 8.9–10.3)
Chloride: 106 mmol/L (ref 98–111)
Creatinine, Ser: 1.5 mg/dL — ABNORMAL HIGH (ref 0.61–1.24)
GFR calc Af Amer: >60 mL/min (ref >60)
GFR calc non Af Amer: 58 mL/min — ABNORMAL LOW (ref >60)
Glucose, Bld: 119 mg/dL — ABNORMAL HIGH (ref 70–99)
Potassium: 3.5 mmol/L (ref 3.5–5.1)
Sodium: 143 mmol/L (ref 135–145)

## 2020-08-08 LAB — MAGNESIUM: Magnesium: 2.3 mg/dL (ref 1.7–2.4)

## 2020-08-08 MED ORDER — FUROSEMIDE 10 MG/ML IJ SOLN
40.0000 mg | Freq: Every day | INTRAMUSCULAR | Status: DC
  Administered 2020-08-08 – 2020-08-11 (×3): 40 mg via INTRAVENOUS
  Filled 2020-08-08 (×3): qty 4

## 2020-08-08 MED ORDER — SACUBITRIL-VALSARTAN 49-51 MG PO TABS
1.0000 | ORAL_TABLET | Freq: Two times a day (BID) | ORAL | Status: DC
  Administered 2020-08-08 – 2020-08-12 (×8): 1 via ORAL
  Filled 2020-08-08 (×10): qty 1

## 2020-08-08 NOTE — Progress Notes (Signed)
PROGRESS NOTE    Steve Carrillo   NIO:[id number]  DOB: Jun 07, 1981  DOA: 08/06/2020     1  PCP: Etta Grandchild, MD  CC: SOB, chest tightness  Hospital Course: Steve Carrillo is a 38 yo AA male with PMH combined systolic diastolic CHF (previous EF 35-40%, Gr II DD on March 2017 echo), CKDII, obesity, mild dilated aortic root, HTN, HLD, prediabetes who presented to the hospital with tightness in his chest, SOB, and overall feeling bad. He has lost his insurance recently and has been out of medications for approximately 2 months he stated.  He was found to have uncontrolled BP on workup in the ER 193/147 with tachycardia and tachypnea.  He had an elevated d-dimer 0.71 and trop 33, thus underwent CTA to rule out PE which was negative for PE but did show small bilateral pleural effusions, moderate cardiomegaly, and pulmonary venous hypertension with interstitial edema.  COVID-19 negative  Last cardiology note reviewed from Dr. Royann Shivers on 08/27/19.  He has previously undergone a sleep study which showed mild OSA and CPAP was not recommended. 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  His BNP was also elevated 485 (not likely to be due to [facility] given off med ~2 mths) and given his presentation he was admitted for a CHF exacerbation.  He was given Lasix 40 mg IV in the ER but does not think he voided a significant amount from this dose. He also does not weigh himself routinely at home nor check his blood pressure.  Does not have a cuff or scale but states that he plans to get them at discharge if needed.   Interval History:  Admitted with worsening shortness of breath at home and some chest tightness.  Found to have worsening of his CHF.   Echo showed worsening EF on repeat this admission.  Overall, he is breathing and feeling better with diuresis and lowering of blood pressure. Cardiology consulted and plan is for inpatient  heart cath possibly on Tuesday.   Old records reviewed in assessment of this patient  ROS: Constitutional: negative for chills and fevers, Respiratory: negative for cough, Cardiovascular: positive for chest pressure/discomfort, dyspnea and exertional chest pressure/discomfort, negative for chest pain and Gastrointestinal: negative for abdominal pain  Assessment & Plan: Acute on chronic combined systolic and diastolic CHF (congestive heart failure) (HCC) 2017 echo: EF 35-40%, Gr 2 DD - follow up repeat echo: EF further reduced, 20 to 25% and now grade 3 diastolic dysfunction.  Not surprising with ongoing medication noncompliance at home due to financial constraints unfortunately -Given further worsening of EF, will reengage cardiology while patient hospitalized.  Now at higher risk for spontaneous arrhythmia; may need consideration for AICD however suspect he may need further trial of medication compliance to see if EF responds again.  He does need significant medication assistance especially with Entresto - appreciate cardiology consult; patient tentatively planned for LHC/RHC on Tuesday given worsened EF/ischemic evaluation  -patient has been resumed on entresto (increased further per cardiology), coreg, spironolactone - continue lipitor - continue lasix daily per cardiology - strict I&O - SW consult for financial assistance at discharge -Consideration of starting Jardiance, however will await further plans especially given financial constraints with medications  Chest pain - considered due to CHF exac and hypertensive urgency/emergency - CTA negative for PE - trop downtrending; EKG reviewed, no ischemic changes - repeat EKG if worsening CP  Hyperlipidemia - continue lipitor -CHFL  149, LDL 107, TG 74 -In the past was also on Vascepa per cardiology  Morbid obesity (HCC) - Body mass index is 43.54 kg/m. 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 Separate 0.5 cm sub solid nodule in the right lower lobe. These may represent areas of slightly more confluent edema are nonspecific.  - Based on current guidelines, CT chest without contrast is recommended in 6 months in order to assess for persistence.  Prediabetes - repeat A1c = 5.7%  Hypokalemia - replete and recheck as needed  Chronic kidney disease, stage 2, mildly decreased GFR - baseline creatinine 1.4-1.6, currently at baseline. Likely due to HTN - avoid nephrotoxic agents as able   Hypertensive urgency, malignant -Due to noncompliance with home meds from loss of insurance -Continue Coreg and Entresto -Use labetalol or hydralazine as needed -will add additional agents as needed  Essential hypertension, malignant -See hypertensive urgency - will continue on chronic regimen once blood pressure stabilized  OSA (obstructive sleep apnea) -Per cardiology notes, has had a sleep study in the past and was found to have mild OSA with no indication for CPAP -May need repeat sleep study outpatient at some point   Antimicrobials: None  DVT prophylaxis: Lovenox Code Status: Full Family Communication: Wife  Disposition Plan: Status is: Inpatient  Remains inpatient appropriate because:IV treatments appropriate due to intensity of illness or inability to take PO and Inpatient level of care appropriate due to severity of illness   Dispo: The patient is from: Home              Anticipated d/c is to: Home              Anticipated d/c date is: 2 days              Patient currently is not medically stable to d/c.   Objective: Blood pressure 122/84, pulse 91, temperature 98.1 F (36.7 C), temperature source Oral, resp. rate 20, height  (1.854 m), weight (!) 150 kg, SpO2 95 %.  Examination: General appearance: alert,  cooperative, no distress and morbidly obese Head: Normocephalic, without obvious abnormality, atraumatic Eyes: EOMI Lungs: clear to auscultation bilaterally Heart: regular rate and rhythm and S1, S2 normal Abdomen: obese, soft, NT, ND, BS present Extremities: no obvious edema Skin: mobility and turgor normal Neurologic: Grossly normal  Consultants:   none  Procedures:   08/07/20, TTE  Data Reviewed: I have personally reviewed following labs and imaging studies Results for orders placed or performed during the hospital encounter of 08/06/20 (from the past 24 hour(s))  TSH     Status: None   Collection Time: 08/07/20  3:52 PM  Result Value Ref Range   TSH 1.728 0.350 - 4.500 uIU/mL  Hemoglobin A1c     Status: Abnormal   Collection Time: 08/07/20  3:52 PM  Result Value Ref Range   Hgb A1c MFr Bld 5.7 (H) 4.8 - 5.6 %   Mean Plasma Glucose 116.89 mg/dL  Troponin I (High Sensitivity)     Status: Abnormal   Collection Time: 08/07/20  3:52 PM  Result Value Ref Range   Troponin I (High Sensitivity) 28 (H) <18 ng/L  Basic metabolic panel     Status: Abnormal   Collection Time: 08/08/20  6:03 AM  Result Value Ref  Range   Sodium 143 135 - 145 mmol/L   Potassium 3.5 3.5 - 5.1 mmol/L   Chloride 106 98 - 111 mmol/L   CO2 26 22 - 32 mmol/L   Glucose, Bld 119 (H) 70 - 99 mg/dL   BUN 21 (H) 6 - 20 mg/dL   Creatinine, Ser 4.09 (H) 0.61 - 1.24 mg/dL   Calcium 9.0 8.9 - 73.5 mg/dL   GFR calc non Af Amer 58 (L) >60 mL/min   GFR calc Af Amer >60 >60 mL/min   Anion gap 11 5 - 15  CBC with Differential/Platelet     Status: None   Collection Time: 08/08/20  6:03 AM  Result Value Ref Range   WBC 6.1 4.0 - 10.5 K/uL   RBC 4.82 4.22 - 5.81 MIL/uL   Hemoglobin 14.0 13.0 - 17.0 g/dL   HCT 32.9 39 - 52 %   MCV 89.6 80.0 - 100.0 fL   MCH 29.0 26.0 - 34.0 pg   MCHC 32.4 30.0 - 36.0 g/dL   RDW 92.4 26.8 - 34.1 %   Platelets 269 150 - 400 K/uL   nRBC 0.0 0.0 - 0.2 %   Neutrophils Relative % 45  %   Neutro Abs 2.8 1.7 - 7.7 K/uL   Lymphocytes Relative 38 %   Lymphs Abs 2.3 0.7 - 4.0 K/uL   Monocytes Relative 11 %   Monocytes Absolute 0.6 0 - 1 K/uL   Eosinophils Relative 5 %   Eosinophils Absolute 0.3 0 - 0 K/uL   Basophils Relative 1 %   Basophils Absolute 0.1 0 - 0 K/uL   Immature Granulocytes 0 %   Abs Immature Granulocytes 0.02 0.00 - 0.07 K/uL  Magnesium     Status: None   Collection Time: 08/08/20  6:03 AM  Result Value Ref Range   Magnesium 2.3 1.7 - 2.4 mg/dL    Recent Results (from the past 240 hour(s))  SARS Coronavirus 2 by RT PCR (hospital order, performed in [facility] hospital lab) Nasopharyngeal Nasopharyngeal Swab     Status: None   Collection Time: 08/06/20 11:44 AM   Specimen: Nasopharyngeal Swab  Result Value Ref Range Status   SARS Coronavirus 2 NEGATIVE NEGATIVE Final    Comment: (NOTE) SARS-CoV-2 target nucleic acids are NOT DETECTED.  The SARS-CoV-2 RNA is generally detectable in upper and lower respiratory specimens during the acute phase of infection. The lowest concentration of SARS-CoV-2 viral copies this assay can detect is 250 copies / mL. A negative result does not preclude SARS-CoV-2 infection and should not be used as the sole basis for treatment or other patient management decisions.  A negative result may occur with improper specimen collection / handling, submission of specimen other than nasopharyngeal swab, presence of viral mutation(s) within the areas targeted by this assay, and inadequate number of viral copies (<250 copies / mL). A negative result must be combined with clinical observations, patient history, and epidemiological information.  Fact Sheet for Patients:   BoilerBrush.com.cy  Fact Sheet for Healthcare Providers: https://pope.com/  This test is not yet approved or  cleared by the [place] FDA and has been authorized for detection and/or diagnosis of SARS-CoV-2  by FDA under an Emergency Use Authorization (EUA).  This EUA will remain in effect (meaning this test can be used) for the duration of the COVID-19 declaration under Section 564(b)(1) of the Act, 21 U.S.C. section 360bbb-3(b)(1), unless the authorization is terminated or revoked sooner.  Performed at [facility], [address] [place], [place] [address]      Radiology Studies: CT ANGIO CHEST PE W OR WO CONTRAST  Result Date: 08/06/2020 CLINICAL DATA:  Elevated D-dimer. Chest pain and shortness of breath EXAM: CT ANGIOGRAPHY CHEST WITH CONTRAST TECHNIQUE: Multidetector CT imaging of the chest was performed using the standard protocol during bolus administration of intravenous contrast. Multiplanar CT image reconstructions and MIPs were obtained to evaluate the vascular anatomy. CONTRAST:  OMNIPAQUE IOHEXOL 350 MG/ML SOLN COMPARISON:  Chest radiograph 08/06/2020 FINDINGS: Cardiovascular: No filling defect is identified in the pulmonary arterial tree to suggest pulmonary embolus. Moderate cardiomegaly especially involving the left heart. Mediastinum/Nodes: Prevascular node 1.0 cm in short axis on image 45/4. Suspected additional mildly enlarged paratracheal and subcarinal lymph nodes, although indistinctly marginated. Lungs/Pleura: Small bilateral pleural effusions, nonspecific for transudative or exudative etiology. Subsegmental atelectasis or scarring anteriorly in the left lower lobe. Prominence of vessels favoring pulmonary venous hypertension Ground-glass density posteriorly in the right upper lobe measuring 1.5 by 0.9 cm on image 65/6. Sub solid right lower lobe nodule 0.5 cm in diameter on image 89/6. Subtle interstitial accentuation. Upper Abdomen: Unremarkable Musculoskeletal: Unremarkable Review of the MIP images confirms the above findings. IMPRESSION: 1. No filling defect is identified in the pulmonary arterial tree to suggest pulmonary embolus. 2. Moderate  cardiomegaly especially involving the left heart. Pulmonary venous hypertension and faint interstitial edema. 3. Small bilateral pleural effusions, nonspecific for transudative or exudative etiology. 4. 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 Separate 0.5 cm sub solid nodule in the right lower lobe. These may represent areas of slightly more confluent edema are nonspecific. Based on current guidelines, CT chest without contrast is recommended in 6 months in order to assess for persistence. 5. Subsegmental atelectasis or scarring anteriorly in the left lower lobe. Electronically Signed   By: Gaylyn Rong M.D.   On: 08/06/2020 18:18   ECHOCARDIOGRAM COMPLETE  Result Date: 08/07/2020    ECHOCARDIOGRAM REPORT   Patient Name:   NIVAN MELENDREZ Date of Exam: 08/07/2020 Medical Rec #:  [id number]      Height:       73.0 in Accession #:    [id number]     Weight:       330.0 lb Date of Birth:  1981-04-19     BSA:          2.662 m Patient Age:    38 years       BP:           141/95 mmHg Patient Gender: M              HR:           90 bpm. Exam Location:  Inpatient Procedure: 2D Echo, Cardiac Doppler and Color Doppler Indications:    Congestive Heart Failure 428.0 / I50.9  History:        Patient has prior history of Echocardiogram examinations, most                 recent 02/07/2016. CHF and Cardiomyopathy, Signs/Symptoms:Chest                 Pain; Risk Factors:Hypertension, Dyslipidemia and Former Smoker.                 Dilated aortic root.  Sonographer:    Renella Cunas RDCS Referring Phys: [id number] JARED E SEGAL IMPRESSIONS  1. Left ventricular ejection  fraction, by estimation, is 20 to 25%. The left ventricle has severely decreased function. The left ventricle demonstrates global hypokinesis. The left ventricular internal cavity size was severely dilated. There is mild left ventricular hypertrophy. Left ventricular diastolic parameters are  consistent with Grade III diastolic dysfunction (restrictive). Elevated left atrial pressure.  2. Right ventricular systolic function is normal. The right ventricular size is normal. Tricuspid regurgitation signal is inadequate for assessing PA pressure.  3. The mitral valve is normal in structure. Mild mitral valve regurgitation.  4. The aortic valve was not well visualized. Aortic valve regurgitation is mild. No aortic stenosis is present.  5. Aortic dilatation noted. There is mild dilatation of the aortic root measuring 40 mm.  6. The inferior vena cava is dilated in size with <50% respiratory variability, suggesting right atrial pressure of 15 mmHg. FINDINGS  Left Ventricle: Left ventricular ejection fraction, by estimation, is 20 to 25%. The left ventricle has severely decreased function. The left ventricle demonstrates global hypokinesis. The left ventricular internal cavity size was severely dilated. There is mild left ventricular hypertrophy. Left ventricular diastolic parameters are consistent with Grade III diastolic dysfunction (restrictive). Elevated left atrial pressure. Right Ventricle: The right ventricular size is normal. Right vetricular wall thickness was not assessed. Right ventricular systolic function is normal. Tricuspid regurgitation signal is inadequate for assessing PA pressure. Left Atrium: Left atrial size was normal in size. Right Atrium: Right atrial size was normal in size. Pericardium: Trivial pericardial effusion is present. Mitral Valve: The mitral valve is normal in structure. Mild mitral valve regurgitation. Tricuspid Valve: The tricuspid valve is normal in structure. Tricuspid valve regurgitation is trivial. Aortic Valve: The aortic valve was not well visualized. Aortic valve regurgitation is mild. No aortic stenosis is present. Pulmonic Valve: The pulmonic valve was not well visualized. Pulmonic valve regurgitation is trivial. Aorta: Aortic dilatation noted. There is mild  dilatation of the aortic root measuring 40 mm. Venous: The inferior vena cava is dilated in size with less than 50% respiratory variability, suggesting right atrial pressure of 15 mmHg. IAS/Shunts: The interatrial septum was not well visualized.  LEFT VENTRICLE PLAX 2D LVIDd:         7.20 cm      Diastology LVIDs:         6.50 cm      LV e' lateral:   7.80 cm/s LV PW:         1.10 cm      LV E/e' lateral: 11.5 LV IVS:        1.10 cm      LV e' medial:    6.05 cm/s LVOT diam:     2.50 cm      LV E/e' medial:  14.8 LV SV:         66 LV SV Index:   25 LVOT Area:     4.91 cm  LV Volumes (MOD) LV vol d, MOD A2C: 297.0 ml LV vol d, MOD A4C: 309.0 ml LV vol s, MOD A2C: 245.0 ml LV vol s, MOD A4C: 233.0 ml LV SV MOD A2C:     52.0 ml LV SV MOD A4C:     309.0 ml LV SV MOD BP:      80.0 ml RIGHT VENTRICLE RV S prime:     12.40 cm/s TAPSE (M-mode): 2.1 cm LEFT ATRIUM             Index       RIGHT ATRIUM  Index LA diam:        5.40 cm 2.03 cm/m  RA Area:     14.90 cm LA Vol (A2C):   63.8 ml 23.97 ml/m RA Volume:   37.10 ml  13.94 ml/m LA Vol (A4C):   66.0 ml 24.79 ml/m LA Biplane Vol: 65.4 ml 24.57 ml/m  AORTIC VALVE LVOT Vmax:   96.00 cm/s LVOT Vmean:  67.900 cm/s LVOT VTI:    0.135 m  AORTA Ao Root diam: 4.00 cm MITRAL VALVE MV Area (PHT): 6.83 cm    SHUNTS MV Decel Time: 111 msec    Systemic VTI:  0.14 m MR Peak grad: 82.4 mmHg    Systemic Diam: 2.50 cm MR Vmax:      454.00 cm/s MV E velocity: 89.50 cm/s MV A velocity: 38.60 cm/s MV E/A ratio:  2.32 Epifanio Lesches MD Electronically signed by Epifanio Lesches MD Signature Date/Time: 08/07/2020/1:44:19 PM    Final    CT ANGIO CHEST PE W OR WO CONTRAST  Final Result    DG Chest 2 View  Final Result      Scheduled Meds: . atorvastatin  40 mg Oral Daily  . carvedilol  25 mg Oral BID WC  . enoxaparin (LOVENOX) injection  40 mg Subcutaneous Q24H  . furosemide  40 mg Intravenous Daily  . sacubitril-valsartan  1 tablet Oral BID  . sodium  chloride flush  3 mL Intravenous Q12H  . spironolactone  25 mg Oral Daily   PRN Meds: sodium chloride, acetaminophen, hydrALAZINE, labetalol, sodium chloride flush Continuous Infusions: . sodium chloride        LOS: 1 day  Time spent: Greater than 50% of the 35 minute visit was spent in counseling/coordination of care for the patient as laid out in the A&P.   Lewie Chamber, MD [facility] 08/08/2020, 2:47 PM  Contact via secure chat.  To contact the attending provider between 7A-7P or the covering provider during after hours 7P-7A, please log into the web site [email or web address] and access using universal [place] password for that web site. If you do not have the password, please call the hospital operator.

## 2020-08-08 NOTE — Consult Note (Signed)
Cardiology Consultation:   Patient ID: Steve Carrillo MRN: [id number]; DOB: [birth year]  Admit date: 08/06/2020 Date of Consult: 08/08/2020  Primary Care Provider: Etta Grandchild, MD [facility] Cardiologist: Thurmon Fair, MD  [facility] Electrophysiologist:  None    Patient Profile:   Steve Carrillo is a 39 y.o. male with a hx of chronic combined systolic and diastolic heart failure, CKD, obesity, hypertension, hyperlipidemia, prediabetes who is being seen today for the evaluation of heart failure at the request of Dr Frederick Peers.  History of Present Illness:   [name] presented on 08/06/2020 to [facility] ED with chest pain and shortness of breath.  He reports that he had lost his insurance and been off all his medications for about 2 months.  In the ED, vital signs notable for BP up to 193/147.  D-dimer was elevated, he underwent CTPA which showed no evidence of PE but did show bilateral pleural effusions and interstitial edema.  COVID-19 was negative.  Labs notable for creatinine 1.46, BNP 486, high-sensitivity troponin 44 >33 >28, hemoglobin 15.3, LDL 107, A1c 5.7, TSH 1.7.  Echocardiogram on 9/4 showed LVEF 20 to 25%, grade 3 diastolic dysfunction, normal RV function, mild MR, mild aortic root dilatation, IVC fixed/dilated.  He was restarted on Entresto 24-26 mg twice daily, spironolactone 25 mg daily, carvedilol 25 mg twice daily.  He has been diuresed with IV Lasix 40 mg daily.  I's and O's have not been recorded.  BP has improved but remains elevated (140/106 this morning).  He reports dyspnea has improved.   Past Medical History:  Diagnosis Date  . 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 Dilated aortic root (HCC)    seen on previous echo 02/2016  . Hyperlipidemia LDL goal <130 2015  . Hypertension   . Prediabetes     Past Surgical History:  Procedure Laterality Date  . HERNIA REPAIR    . LEFT HEART  CATHETERIZATION WITH CORONARY ANGIOGRAM N/A 10/22/2014   Procedure: LEFT HEART CATHETERIZATION WITH CORONARY ANGIOGRAM;  Surgeon: Peter M Swaziland, MD; Normal coronary anatomy. Vessels are very large due to hypertensive cardiomyopathy.     Inpatient Medications: Scheduled Meds: . atorvastatin  40 mg Oral Daily  . carvedilol  25 mg Oral BID WC  . enoxaparin (LOVENOX) injection  40 mg Subcutaneous Q24H  . sacubitril-valsartan  1 tablet Oral BID  . sodium chloride flush  3 mL Intravenous Q12H  . spironolactone  25 mg Oral Daily   Continuous Infusions: . sodium chloride     PRN Meds: sodium chloride, acetaminophen, hydrALAZINE, labetalol, sodium chloride flush  Allergies:   No Known Allergies  Social History:   Social History   Socioeconomic History  . Marital status: Married    Spouse name: Not on file  . Number of children: Not on file  . Years of education: Not on file  . Highest education level: Not on file  Occupational History  . Occupation: Mining engineer: [employer]  Tobacco Use  . Smoking status: Former Smoker    Quit date: 09/14/2014    Years since quitting: 5.9  . Smokeless tobacco: Never Used  Vaping Use  . Vaping Use: Never used  Substance and Sexual Activity  . Alcohol use: Yes    Comment: Twice a month  . Drug use: No  . Sexual activity: Not on file  Other Topics Concern  .  Not on file  Social History Narrative   Lives with girlfriend.    Social Determinants of Health   Financial Resource Strain:   . Difficulty of Paying Living Expenses: Not on file  Food Insecurity:   . Worried About Programme researcher, broadcasting/film/videounning Out of Food in the Last Year: Not on file  . Ran Out of Food in the Last Year: Not on file  Transportation Needs:   . Lack of Transportation (Medical): Not on file  . Lack of Transportation (Non-Medical): Not on file  Physical Activity:   . Days of Exercise per Week: Not on file  . Minutes of Exercise per Session: Not on file  Stress:     . Feeling of Stress : Not on file  Social Connections:   . Frequency of Communication with Friends and Family: Not on file  . Frequency of Social Gatherings with Friends and Family: Not on file  . Attends Religious Services: Not on file  . Active Member of Clubs or Organizations: Not on file  . Attends BankerClub or Organization Meetings: Not on file  . Marital Status: Not on file  Intimate Partner Violence:   . Fear of Current or Ex-Partner: Not on file  . Emotionally Abused: Not on file  . Physically Abused: Not on file  . Sexually Abused: Not on file    Family History:    Family History  Problem Relation Age of Onset  . Diabetes Mellitus II Mother   . Hypertension Mother   . Diabetes Mellitus II Maternal Grandmother      ROS:  Please see the history of present illness.   All other ROS reviewed and negative.     Physical Exam/Data:   Vitals:   08/07/20 1250 08/07/20 1726 08/07/20 2004 08/08/20 0615  BP: (!) 151/100 (!) 147/106 (!) 146/115 (!) 140/106  Pulse: 85 88 95 93  Resp: (!) 22  18   Temp: 98.2 F (36.8 C)  98.3 F (36.8 C) 98.3 F (36.8 C)  TempSrc: Oral  Oral Oral  SpO2:   96% 97%  Weight:      Height:       No intake or output data in the 24 hours ending 08/08/20 0813 Last 3 Weights 08/07/2020 08/06/2020 08/27/2019  Weight (lbs) 332 lb 14.3 oz 330 lb 316 lb 6.4 oz  Weight (kg) 151 kg 149.687 kg 143.518 kg     Body mass index is 43.92 kg/m.  General:  in no acute distress HEENT: normal Lymph: no adenopathy Neck: no JVD appreciated but difficult to assess given habitus Cardiac:  normal S1, S2; RRR; no murmur  Lungs:  clear to auscultation bilaterally Abd: soft, nontender, no hepatomegaly  Ext: no edema Musculoskeletal:  No deformities, BUE and BLE strength normal and equal Skin: warm and dry  Neuro:   no focal abnormalities noted Psych:  Normal affect   EKG:  The EKG was personally reviewed and demonstrates:  NSR, rate 90, LVH, QTc 521 Telemetry:   Telemetry was personally reviewed and demonstrates:  NSR rate 90s  Relevant CV Studies: Echo 08/07/20: 1. Left ventricular ejection fraction, by estimation, is 20 to 25%. The  left ventricle has severely decreased function. The left ventricle  demonstrates global hypokinesis. The left ventricular internal cavity size  was severely dilated. There is mild  left ventricular hypertrophy. Left ventricular diastolic parameters are  consistent with Grade III diastolic dysfunction (restrictive). Elevated  left atrial pressure.  2. Right ventricular systolic function is normal. The right ventricular  size is normal. Tricuspid regurgitation signal is inadequate for assessing  PA pressure.  3. The mitral valve is normal in structure. Mild mitral valve  regurgitation.  4. The aortic valve was not well visualized. Aortic valve regurgitation  is mild. No aortic stenosis is present.  5. Aortic dilatation noted. There is mild dilatation of the aortic root  measuring 40 mm.  6. The inferior vena cava is dilated in size with <50% respiratory  variability, suggesting right atrial pressure of 15 mmHg.   Laboratory Data:  High Sensitivity Troponin:   Recent Labs  Lab 08/06/20 1144 08/06/20 1410 08/07/20 1552  TROPONINIHS 44* 33* 28*     Chemistry Recent Labs  Lab 08/06/20 1144 08/07/20 0557 08/08/20 0603  NA 138 138 143  K 3.7 3.4* 3.5  CL 104 105 106  CO2 GLUCOSE 118* 111* 119*  BUN 21* 19 21*  CREATININE 1.46* 1.40* 1.50*  CALCIUM 8.8* 8.9 9.0  GFRNONAA >60 >60 58*  GFRAA >60 >60 >60  ANIONGAP No results for input(s): PROT, ALBUMIN, AST, ALT, ALKPHOS, BILITOT in the last 168 hours. Hematology Recent Labs  Lab 08/06/20 1144 08/08/20 0603  WBC 6.9 6.1  RBC 5.28 4.82  HGB 15.3 14.0  HCT 46.3 43.2  MCV 87.7 89.6  MCH 29.0 29.0  MCHC 33.0 32.4  RDW 13.4 13.4  PLT 287 269   BNP Recent Labs  Lab 08/06/20 1144  BNP 485.5*    DDimer  Recent Labs    Lab 08/06/20 1144  DDIMER 0.71*     Radiology/Studies:  DG Chest 2 View  Result Date: 08/06/2020 CLINICAL DATA:  Shortness of breath EXAM: CHEST - 2 VIEW COMPARISON:  11/11/2018 chest radiograph and prior. FINDINGS: Cardiomegaly with central pulmonary venous congestion. Diffuse interstitial prominence. No focal consolidation. No pneumothorax. Trace left pleural effusion. No acute osseous abnormalities. IMPRESSION: Cardiomegaly, central pulmonary vascular congestion and interstitial pulmonary edema. Electronically Signed   By: Stana Bunting M.D.   On: 08/06/2020 08:46   CT ANGIO CHEST PE W OR WO CONTRAST  Result Date: 08/06/2020 CLINICAL DATA:  Elevated D-dimer. Chest pain and shortness of breath EXAM: CT ANGIOGRAPHY CHEST WITH CONTRAST TECHNIQUE: Multidetector CT imaging of the chest was performed using the standard protocol during bolus administration of intravenous contrast. Multiplanar CT image reconstructions and MIPs were obtained to evaluate the vascular anatomy. CONTRAST:  OMNIPAQUE IOHEXOL 350 MG/ML SOLN COMPARISON:  Chest radiograph 08/06/2020 FINDINGS: Cardiovascular: No filling defect is identified in the pulmonary arterial tree to suggest pulmonary embolus. Moderate cardiomegaly especially involving the left heart. Mediastinum/Nodes: Prevascular node 1.0 cm in short axis on image 45/4. Suspected additional mildly enlarged paratracheal and subcarinal lymph nodes, although indistinctly marginated. Lungs/Pleura: Small bilateral pleural effusions, nonspecific for transudative or exudative etiology. Subsegmental atelectasis or scarring anteriorly in the left lower lobe. Prominence of vessels favoring pulmonary venous hypertension Ground-glass density posteriorly in the right upper lobe measuring 1.5 by 0.9 cm on image 65/6. Sub solid right lower lobe nodule 0.5 cm in diameter on image 89/6. Subtle interstitial accentuation. Upper Abdomen: Unremarkable Musculoskeletal: Unremarkable  Review of the MIP images confirms the above findings. IMPRESSION: 1. No filling defect is identified in the pulmonary arterial tree to suggest pulmonary embolus. 2. Moderate cardiomegaly especially involving the left heart. Pulmonary venous hypertension and faint interstitial edema. 3. Small bilateral pleural effusions, nonspecific for transudative or exudative etiology. 4. Ground-glass density posteriorly in the right upper lobe, 1.5  by 0.9 cm in diameter, probably from alveolitis. Separate 0.5 cm sub solid nodule in the right lower lobe. These may represent areas of slightly more confluent edema are nonspecific. Based on current guidelines, CT chest without contrast is recommended in 6 months in order to assess for persistence. 5. Subsegmental atelectasis or scarring anteriorly in the left lower lobe. Electronically Signed   By: Gaylyn Rong M.D.   On: 08/06/2020 18:18   ECHOCARDIOGRAM COMPLETE  Result Date: 08/07/2020    ECHOCARDIOGRAM REPORT   Patient Name:   Steve Carrillo Date of Exam: 08/07/2020 Medical Rec #:  [id number]      Height:       73.0 in Accession #:    [id number]     Weight:       330.0 lb Date of Birth:  1981-11-26     BSA:          2.662 m Patient Age:    38 years       BP:           141/95 mmHg Patient Gender: M              HR:           90 bpm. Exam Location:  Inpatient Procedure: 2D Echo, Cardiac Doppler and Color Doppler Indications:    Congestive Heart Failure 428.0 / I50.9  History:        Patient has prior history of Echocardiogram examinations, most                 recent 02/07/2016. CHF and Cardiomyopathy, Signs/Symptoms:Chest                 Pain; Risk Factors:Hypertension, Dyslipidemia and Former Smoker.                 Dilated aortic root.  Sonographer:    Renella Cunas RDCS Referring Phys: [id number] JARED E SEGAL IMPRESSIONS  1. Left ventricular ejection fraction, by estimation, is 20 to 25%. The left ventricle has severely decreased function. The left ventricle demonstrates  global hypokinesis. The left ventricular internal cavity size was severely dilated. There is mild left ventricular hypertrophy. Left ventricular diastolic parameters are consistent with Grade III diastolic dysfunction (restrictive). Elevated left atrial pressure.  2. Right ventricular systolic function is normal. The right ventricular size is normal. Tricuspid regurgitation signal is inadequate for assessing PA pressure.  3. The mitral valve is normal in structure. Mild mitral valve regurgitation.  4. The aortic valve was not well visualized. Aortic valve regurgitation is mild. No aortic stenosis is present.  5. Aortic dilatation noted. There is mild dilatation of the aortic root measuring 40 mm.  6. The inferior vena cava is dilated in size with <50% respiratory variability, suggesting right atrial pressure of 15 mmHg. FINDINGS  Left Ventricle: Left ventricular ejection fraction, by estimation, is 20 to 25%. The left ventricle has severely decreased function. The left ventricle demonstrates global hypokinesis. The left ventricular internal cavity size was severely dilated. There is mild left ventricular hypertrophy. Left ventricular diastolic parameters are consistent with Grade III diastolic dysfunction (restrictive). Elevated left atrial pressure. Right Ventricle: The right ventricular size is normal. Right vetricular wall thickness was not assessed. Right ventricular systolic function is normal. Tricuspid regurgitation signal is inadequate for assessing PA pressure. Left Atrium: Left atrial size was normal in size. Right Atrium: Right atrial size was normal in size. Pericardium: Trivial pericardial effusion is present. Mitral Valve: The mitral valve is normal  in structure. Mild mitral valve regurgitation. Tricuspid Valve: The tricuspid valve is normal in structure. Tricuspid valve regurgitation is trivial. Aortic Valve: The aortic valve was not well visualized. Aortic valve regurgitation is mild. No aortic  stenosis is present. Pulmonic Valve: The pulmonic valve was not well visualized. Pulmonic valve regurgitation is trivial. Aorta: Aortic dilatation noted. There is mild dilatation of the aortic root measuring 40 mm. Venous: The inferior vena cava is dilated in size with less than 50% respiratory variability, suggesting right atrial pressure of 15 mmHg. IAS/Shunts: The interatrial septum was not well visualized.  LEFT VENTRICLE PLAX 2D LVIDd:         7.20 cm      Diastology LVIDs:         6.50 cm      LV e' lateral:   7.80 cm/s LV PW:         1.10 cm      LV E/e' lateral: 11.5 LV IVS:        1.10 cm      LV e' medial:    6.05 cm/s LVOT diam:     2.50 cm      LV E/e' medial:  14.8 LV SV:         66 LV SV Index:   25 LVOT Area:     4.91 cm  LV Volumes (MOD) LV vol d, MOD A2C: 297.0 ml LV vol d, MOD A4C: 309.0 ml LV vol s, MOD A2C: 245.0 ml LV vol s, MOD A4C: 233.0 ml LV SV MOD A2C:     52.0 ml LV SV MOD A4C:     309.0 ml LV SV MOD BP:      80.0 ml RIGHT VENTRICLE RV S prime:     12.40 cm/s TAPSE (M-mode): 2.1 cm LEFT ATRIUM             Index       RIGHT ATRIUM           Index LA diam:        5.40 cm 2.03 cm/m  RA Area:     14.90 cm LA Vol (A2C):   63.8 ml 23.97 ml/m RA Volume:   37.10 ml  13.94 ml/m LA Vol (A4C):   66.0 ml 24.79 ml/m LA Biplane Vol: 65.4 ml 24.57 ml/m  AORTIC VALVE LVOT Vmax:   96.00 cm/s LVOT Vmean:  67.900 cm/s LVOT VTI:    0.135 m  AORTA Ao Root diam: 4.00 cm MITRAL VALVE MV Area (PHT): 6.83 cm    SHUNTS MV Decel Time: 111 msec    Systemic VTI:  0.14 m MR Peak grad: 82.4 mmHg    Systemic Diam: 2.50 cm MR Vmax:      454.00 cm/s MV E velocity: 89.50 cm/s MV A velocity: 38.60 cm/s MV E/A ratio:  2.32 Epifanio Lesches MD Electronically signed by Epifanio Lesches MD Signature Date/Time: 08/07/2020/1:44:19 PM    Final    {   Assessment and Plan:   Acute on chronic combined systolic and diastolic heart failure: EF 35 to 40% on prior echocardiogram 02/2016.  Cardiac catheterization in  2015 did not show any coronary artery disease.  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  Currently presenting with uncontrolled hypertension after being off his medications for last 2 months.  Echocardiogram this admission shows EF 20 to 25%.  He was restarted on Entresto, spironolactone, and carvedilol. -Has been diuresing with IV Lasix 40 mg daily.  I/Os and weight not recorded, but reports  symptomatic improvement and stable renal function.  Continue IV diuresis for today, check strict I/O's and daily weights -Continue carvedilol 25 mg twice daily -Continue spironolactone 25 mg daily -Will increase Entresto to 49-51 mg twice daily.  Will need to discuss with case manager to ensure patient can get his meds as outpatient, if unable to get entresto would switch to losartan -Given worsening systolic function and presentation with chest pain, and considering last evaluation of coronary arteries in 2015, recommend cardiac catheterization to rule out obstructive CAD as cause of worsening heart failure.  Will plan RHC/LHC likely on Tuesday  Hypertension: Has been uncontrolled, up to 193/147 on admission.  Had been off antihypertensives for past 2 months.  -BP improved but remains elevated.  Continue carvedilol and spironolactone, will increase Entresto as above -Suspect untreated OSA likely contributing to uncontrolled hypertension, will need sleep study as outpatient  Troponin elevation: High-sensitivity troponin mildly elevated on admission, not consistent with acute coronary syndrome.  Likely demand ischemia in setting of decompensated heart failure as above.  However does report has been having chest pain, and considering worsening systolic heart failure, will plan for cardiac catheterization as above.  Hyperlipidemia: LDL 107, continue atorvastatin 40 mg  Morbid obesity: Body mass index is 43.92 kg/m.  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  Will monitor with  diuresis  QT prolongation: QTc 521 on EKG.  Maintain K>4, Mag>2  For questions or updates, please contact [facility] Please consult [email or web address] for contact info under    Signed, Little Ishikawa, MD  08/08/2020 8:13 AM

## 2020-08-09 DIAGNOSIS — G4733 Obstructive sleep apnea (adult) (pediatric): Secondary | ICD-10-CM

## 2020-08-09 DIAGNOSIS — I10 Essential (primary) hypertension: Secondary | ICD-10-CM

## 2020-08-09 DIAGNOSIS — N182 Chronic kidney disease, stage 2 (mild): Secondary | ICD-10-CM

## 2020-08-09 DIAGNOSIS — E876 Hypokalemia: Secondary | ICD-10-CM

## 2020-08-09 DIAGNOSIS — E785 Hyperlipidemia, unspecified: Secondary | ICD-10-CM

## 2020-08-09 LAB — BASIC METABOLIC PANEL
Anion gap: 10 (ref 5–15)
BUN: 22 mg/dL — ABNORMAL HIGH (ref 6–20)
CO2: 24 mmol/L (ref 22–32)
Calcium: 8.9 mg/dL (ref 8.9–10.3)
Chloride: 103 mmol/L (ref 98–111)
Creatinine, Ser: 1.49 mg/dL — ABNORMAL HIGH (ref 0.61–1.24)
GFR calc Af Amer: >60 mL/min (ref >60)
GFR calc non Af Amer: 59 mL/min — ABNORMAL LOW (ref >60)
Glucose, Bld: 119 mg/dL — ABNORMAL HIGH (ref 70–99)
Potassium: 3.4 mmol/L — ABNORMAL LOW (ref 3.5–5.1)
Sodium: 137 mmol/L (ref 135–145)

## 2020-08-09 LAB — CBC WITH DIFFERENTIAL/PLATELET
Abs Immature Granulocytes: 0.02 10*3/uL (ref 0.00–0.07)
Basophils Absolute: 0.1 10*3/uL (ref 0.0–0.1)
Basophils Relative: 1 %
Eosinophils Absolute: 0.3 10*3/uL (ref 0.0–0.5)
Eosinophils Relative: 4 %
HCT: 45.9 % (ref 39.0–52.0)
Hemoglobin: 14.5 g/dL (ref 13.0–17.0)
Immature Granulocytes: 0 %
Lymphocytes Relative: 38 %
Lymphs Abs: 2.4 10*3/uL (ref 0.7–4.0)
MCH: 28.2 pg (ref 26.0–34.0)
MCHC: 31.6 g/dL (ref 30.0–36.0)
MCV: 89.1 fL (ref 80.0–100.0)
Monocytes Absolute: 0.7 10*3/uL (ref 0.1–1.0)
Monocytes Relative: 10 %
Neutro Abs: 3 10*3/uL (ref 1.7–7.7)
Neutrophils Relative %: 47 %
Platelets: 296 10*3/uL (ref 150–400)
RBC: 5.15 MIL/uL (ref 4.22–5.81)
RDW: 13.3 % (ref 11.5–15.5)
WBC: 6.4 10*3/uL (ref 4.0–10.5)
nRBC: 0 % (ref 0.0–0.2)

## 2020-08-09 LAB — MAGNESIUM: Magnesium: 2.2 mg/dL (ref 1.7–2.4)

## 2020-08-09 MED ORDER — POTASSIUM CHLORIDE CRYS ER 20 MEQ PO TBCR
40.0000 meq | EXTENDED_RELEASE_TABLET | Freq: Once | ORAL | Status: AC
  Administered 2020-08-09: 40 meq via ORAL
  Filled 2020-08-09: qty 2

## 2020-08-09 MED ORDER — ISOSORB DINITRATE-HYDRALAZINE 20-37.5 MG PO TABS
1.0000 | ORAL_TABLET | Freq: Three times a day (TID) | ORAL | Status: DC
  Administered 2020-08-09 – 2020-08-12 (×8): 1 via ORAL
  Filled 2020-08-09 (×9): qty 1

## 2020-08-09 NOTE — Progress Notes (Signed)
Progress Note  Patient Name: Steve Carrillo Date of Encounter: 08/09/2020  [facility] Cardiologist: Thurmon Fair, MD   Subjective   Feels better. In/out not recorded. Weight yesterday unchanged from admission. No weight today. BUN and creat unchanged. For first time since admission, DBP<100.  Inpatient Medications    Scheduled Meds: . atorvastatin  40 mg Oral Daily  . carvedilol  25 mg Oral BID WC  . enoxaparin (LOVENOX) injection  40 mg Subcutaneous Q24H  . furosemide  40 mg Intravenous Daily  . sacubitril-valsartan  1 tablet Oral BID  . sodium chloride flush  3 mL Intravenous Q12H  . spironolactone  25 mg Oral Daily   Continuous Infusions: . sodium chloride     PRN Meds: sodium chloride, acetaminophen, hydrALAZINE, labetalol, sodium chloride flush   Vital Signs    Vitals:   08/08/20 2041 08/09/20 0516 08/09/20 0611 08/09/20 0648  BP: (!) 148/104 (!) 152/106 (!) 153/113 131/90  Pulse: 90 85 86 83  Resp: (!) 24     Temp: 98.2 F (36.8 C) 98.2 F (36.8 C)    TempSrc: Oral Oral    SpO2: 97% 95%    Weight:      Height:       No intake or output data in the 24 hours ending 08/09/20 0827 Last 3 Weights 08/08/2020 08/07/2020 08/06/2020  Weight (lbs) 330 lb 11 oz 332 lb 14.3 oz 330 lb  Weight (kg) 150 kg 151 kg 149.687 kg      Telemetry    NSR; one brief episode of Mobitz I second degree AV block around 8AM today - Personally Reviewed  ECG    NSR, biatrial enlargement, LVH, long QT - Personally Reviewed  Physical Exam  Obese GEN: No acute distress.   Neck: No JVD Cardiac: RRR, no murmurs, rubs, or gallops.  Respiratory: Clear to auscultation bilaterally. GI: Soft, nontender, non-distended  MS: No edema; No deformity. Neuro:  Nonfocal  Psych: Normal affect   Labs    High Sensitivity Troponin:   Recent Labs  Lab 08/06/20 1144 08/06/20 1410 08/07/20 1552  TROPONINIHS 44* 33* 28*      Chemistry Recent Labs  Lab 08/07/20 0557 08/08/20 0603  08/09/20 0621  NA 138 143 137  K 3.4* 3.5 3.4*  CL 105 106 103  CO2 25 26 24   GLUCOSE 111* 119* 119*  BUN 19 21* 22*  CREATININE 1.40* 1.50* 1.49*  CALCIUM 8.9 9.0 8.9  GFRNONAA >60 58* 59*  GFRAA >60 >60 >60  ANIONGAP 8 11 10      Hematology Recent Labs  Lab 08/06/20 1144 08/08/20 0603 08/09/20 0621  WBC 6.9 6.1 6.4  RBC 5.28 4.82 5.15  HGB 15.3 14.0 14.5  HCT 46.3 43.2 45.9  MCV 87.7 89.6 89.1  MCH 29.0 29.0 28.2  MCHC 33.0 32.4 31.6  RDW 13.4 13.4 13.3  PLT 287 269 296    BNP Recent Labs  Lab 08/06/20 1144  BNP 485.5*     DDimer  Recent Labs  Lab 08/06/20 1144  DDIMER 0.71*     Radiology    ECHOCARDIOGRAM COMPLETE  Result Date: 08/07/2020    ECHOCARDIOGRAM REPORT   Patient Name:   Steve Carrillo Date of Exam: 08/07/2020 Medical Rec #:  [name]      Height:       73.0 in Accession #:    10/07/2020     Weight:       330.0 lb Date of Birth:  [birth year]  BSA:          2.662 m Patient Age:    38 years       BP:           141/95 mmHg Patient Gender: M              HR:           90 bpm. Exam Location:  Inpatient Procedure: 2D Echo, Cardiac Doppler and Color Doppler Indications:    Congestive Heart Failure 428.0 / I50.9  History:        Patient has prior history of Echocardiogram examinations, most                 recent 02/07/2016. CHF and Cardiomyopathy, Signs/Symptoms:Chest                 Pain; Risk Factors:Hypertension, Dyslipidemia and Former Smoker.                 Dilated aortic root.  Sonographer:    Renella Cunas RDCS Referring Phys: [id number] JARED E SEGAL IMPRESSIONS  1. Left ventricular ejection fraction, by estimation, is 20 to 25%. The left ventricle has severely decreased function. The left ventricle demonstrates global hypokinesis. The left ventricular internal cavity size was severely dilated. There is mild left ventricular hypertrophy. Left ventricular diastolic parameters are consistent with Grade III diastolic dysfunction (restrictive). Elevated left  atrial pressure.  2. Right ventricular systolic function is normal. The right ventricular size is normal. Tricuspid regurgitation signal is inadequate for assessing PA pressure.  3. The mitral valve is normal in structure. Mild mitral valve regurgitation.  4. The aortic valve was not well visualized. Aortic valve regurgitation is mild. No aortic stenosis is present.  5. Aortic dilatation noted. There is mild dilatation of the aortic root measuring 40 mm.  6. The inferior vena cava is dilated in size with <50% respiratory variability, suggesting right atrial pressure of 15 mmHg. FINDINGS  Left Ventricle: Left ventricular ejection fraction, by estimation, is 20 to 25%. The left ventricle has severely decreased function. The left ventricle demonstrates global hypokinesis. The left ventricular internal cavity size was severely dilated. There is mild left ventricular hypertrophy. Left ventricular diastolic parameters are consistent with Grade III diastolic dysfunction (restrictive). Elevated left atrial pressure. Right Ventricle: The right ventricular size is normal. Right vetricular wall thickness was not assessed. Right ventricular systolic function is normal. Tricuspid regurgitation signal is inadequate for assessing PA pressure. Left Atrium: Left atrial size was normal in size. Right Atrium: Right atrial size was normal in size. Pericardium: Trivial pericardial effusion is present. Mitral Valve: The mitral valve is normal in structure. Mild mitral valve regurgitation. Tricuspid Valve: The tricuspid valve is normal in structure. Tricuspid valve regurgitation is trivial. Aortic Valve: The aortic valve was not well visualized. Aortic valve regurgitation is mild. No aortic stenosis is present. Pulmonic Valve: The pulmonic valve was not well visualized. Pulmonic valve regurgitation is trivial. Aorta: Aortic dilatation noted. There is mild dilatation of the aortic root measuring 40 mm. Venous: The inferior vena cava is  dilated in size with less than 50% respiratory variability, suggesting right atrial pressure of 15 mmHg. IAS/Shunts: The interatrial septum was not well visualized.  LEFT VENTRICLE PLAX 2D LVIDd:         7.20 cm      Diastology LVIDs:         6.50 cm      LV e' lateral:   7.80 cm/s LV PW:  1.10 cm      LV E/e' lateral: 11.5 LV IVS:        1.10 cm      LV e' medial:    6.05 cm/s LVOT diam:     2.50 cm      LV E/e' medial:  14.8 LV SV:         66 LV SV Index:   25 LVOT Area:     4.91 cm  LV Volumes (MOD) LV vol d, MOD A2C: 297.0 ml LV vol d, MOD A4C: 309.0 ml LV vol s, MOD A2C: 245.0 ml LV vol s, MOD A4C: 233.0 ml LV SV MOD A2C:     52.0 ml LV SV MOD A4C:     309.0 ml LV SV MOD BP:      80.0 ml RIGHT VENTRICLE RV S prime:     12.40 cm/s TAPSE (M-mode): 2.1 cm LEFT ATRIUM             Index       RIGHT ATRIUM           Index LA diam:        5.40 cm 2.03 cm/m  RA Area:     14.90 cm LA Vol (A2C):   63.8 ml 23.97 ml/m RA Volume:   37.10 ml  13.94 ml/m LA Vol (A4C):   66.0 ml 24.79 ml/m LA Biplane Vol: 65.4 ml 24.57 ml/m  AORTIC VALVE LVOT Vmax:   96.00 cm/s LVOT Vmean:  67.900 cm/s LVOT VTI:    0.135 m  AORTA Ao Root diam: 4.00 cm MITRAL VALVE MV Area (PHT): 6.83 cm    SHUNTS MV Decel Time: 111 msec    Systemic VTI:  0.14 m MR Peak grad: 82.4 mmHg    Systemic Diam: 2.50 cm MR Vmax:      454.00 cm/s MV E velocity: 89.50 cm/s MV A velocity: 38.60 cm/s MV E/A ratio:  2.32 Christopher Schumann MD Electronically signed by Christopher Schumann MD Signature Date/Time: 08/07/2020/1:44:19 PM    Final     Cardiac Studies   As above  Patient Profile     38 y.o. male with a hx of chronic combined systolic and diastolic heart failure, CKD 2, morbid obesity, hypertension, hyperlipidemia, prediabetes admitted with severe HTN and HF exacerbation.   Assessment & Plan    Acute on chronic combined systolic and diastolic heart failure: EF 35-40%  In 2017, now 20-25%, after being off all meds for months.  Cardiac  catheterization in 2015 did not show any coronary artery disease. Restarted on Entresto, spironolactone, and carvedilol. - rec'd IV diuretics - no record of output. Continue today, hold AM dose for cath and then restart on IV/PO diuretics based on right heart cath pressures  -  carvedilol 25 mg twice daily,  spironolactone 25 mg daily, Entresto titrated to 49-51 mg twice daily yesterday.  Will need to discuss with case manager to ensure patient can get his meds as outpatient, if unable to get entresto would switch to losartan -  RHC/LHC tomorrow. This procedure has been fully reviewed with the patient and written informed consent has been obtained.  Hypertension: malignant, with HF and CKD.  OSA: likely contributing to uncontrolled hypertension, will need sleep study as outpatient.  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 Continue carvedilol.  Troponin elevation: likely due to HTN/HF exacerbation, but will have cath for CAD tomorrow  Hyperlipidemia: LDL 107, continue atorvastatin 40 mg. If CAD iis   identified, increase lipid lowering meds to target LDL<70.  Morbid obesity: Body mass index is 43.92 kg/m.  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  Will monitor with diuresis.  QT prolongation: QTc 521 on EKG.  K still mildly low, MG ok. Recheck when K is normal, the degree of QT prolongation appears disproportionate.     For questions or updates, please contact [facility] Please consult [email or web address] for contact info under        Signed, Thurmon Fair, MD  08/09/2020, 8:27 AM

## 2020-08-09 NOTE — Progress Notes (Signed)
PROGRESS NOTE    Steve Carrillo   ONG:[id number]  DOB: 1981/01/04  DOA: 08/06/2020     2  PCP: Etta GrandchildJones, Thomas L, MD  CC: SOB, chest tightness  Hospital Course: Steve Carrillo is a 39 yo AA male with PMH combined systolic diastolic CHF (previous EF 35-40%, Gr II DD on March 2017 echo), CKDII, obesity, mild dilated aortic root, HTN, HLD, prediabetes who presented to the hospital with tightness in his chest, SOB, and overall feeling bad. He has lost his insurance recently and has been out of medications for approximately 2 months he stated.  He was found to have uncontrolled BP on workup in the ER 193/147 with tachycardia and tachypnea.  He had an elevated d-dimer 0.71 and trop 33, thus underwent CTA to rule out PE which was negative for PE but did show small bilateral pleural effusions, moderate cardiomegaly, and pulmonary venous hypertension with interstitial edema.  COVID-19 negative  Last cardiology note reviewed from Dr. Royann Shiversroitoru on 08/27/19.  He has previously undergone a sleep study which showed mild OSA and CPAP was not recommended. 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  His BNP was also elevated 485 ([facility] likely to be [facility]) and given his presentation he was admitted for a CHF exacerbation.  He was given Lasix 40 mg IV in the ER but does not think he voided a significant amount from this dose. He also does not weigh himself routinely at home nor check his blood pressure.  Does not have a cuff or scale but states that he plans to get them at discharge if needed.  Cardiology was consulted during hospitalization and recommended LHC/RHC to evaluate for ischemic workup and due to his worsening EF on echo (20-25%). Planned for 08/10/20.    Interval History:  Admitted with worsening shortness of breath at home and some chest tightness.  Found to have worsening of his CHF.   Echo showed  worsening EF on repeat this admission.  Overall, he is breathing and feeling better with diuresis and lowering of blood pressure. Cardiology consulted and plan is for inpatient heart cath possibly on Tuesday.  He is comfortably sitting up in bed this am. No CP, no SOB. Informed him again we are trying to measure urine output.    Old records reviewed in assessment of this patient  ROS: Constitutional: negative for chills and fevers, Respiratory: negative for cough, Cardiovascular: positive for chest pressure/discomfort, dyspnea and exertional chest pressure/discomfort, negative for chest pain and Gastrointestinal: negative for abdominal pain  Assessment & Plan: Acute on chronic combined systolic and diastolic CHF (congestive heart failure) (HCC) 2017 echo: EF 35-40%, Gr 2 DD - follow up repeat echo: EF further reduced, 20 to 25% and now grade 3 diastolic dysfunction.  Not surprising with ongoing medication noncompliance at home due to financial constraints unfortunately -Given further worsening of EF, will reengage cardiology while patient hospitalized.  Now at higher risk for spontaneous arrhythmia; may need consideration for AICD however suspect he may need further trial of medication compliance to see if EF responds again.  He does need significant medication assistance especially with Entresto - appreciate cardiology consult; patient tentatively planned for LHC/RHC on Tuesday given worsened EF/ischemic evaluation  -patient has been resumed on entresto (increased further per cardiology), coreg, spironolactone (doubtful for entresto affordability at discharge so likely plan on giving losartan script instead) - continue lipitor - continue lasix  daily per cardiology - strict I&O; has not been measured, discussed again with patient and staff - SW consult for financial assistance at discharge; will need to [facility] with [facility] -Consideration of starting Jardiance, however will await further plans  especially given financial constraints with medications  Chest pain - considered due to CHF exac and hypertensive urgency/emergency - CTA negative for PE - trop downtrending; EKG reviewed, no ischemic changes - repeat EKG if worsening CP  Hyperlipidemia - continue lipitor -CHFL 149, LDL 107, TG 74 -In the past was also on Vascepa per cardiology  Morbid obesity (HCC) - Body mass index is 43.54 kg/m. 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 Separate 0.5 cm sub solid nodule in the right lower lobe. These may represent areas of slightly more confluent edema are nonspecific.  - Based on current guidelines, CT chest without contrast is recommended in 6 months in order to assess for persistence.  Prediabetes - repeat A1c = 5.7%  Hypokalemia - replete and recheck as needed  Chronic kidney disease, stage 2, mildly decreased GFR - baseline creatinine 1.4-1.6, currently at baseline. Likely due to HTN - avoid nephrotoxic agents as able   Hypertensive urgency, malignant -Due to noncompliance with home meds from loss of insurance -Continue Coreg and Entresto -Use labetalol or hydralazine as needed -will add additional agents as needed  Essential hypertension, malignant -See hypertensive urgency - will continue on chronic regimen once blood pressure stabilized  OSA (obstructive sleep apnea) -Per cardiology notes, has had a sleep study in the past and was found to have mild OSA with no indication for CPAP -May need repeat sleep study outpatient at some point   Antimicrobials: None  DVT prophylaxis: Lovenox Code Status: Full Family Communication: Wife  Disposition Plan: Status is: Inpatient  Remains inpatient appropriate because:IV treatments appropriate due to intensity of illness or  inability to take PO and Inpatient level of care appropriate due to severity of illness   Dispo: The patient is from: Home              Anticipated d/c is to: Home              Anticipated d/c date is: 2 days              Patient currently is not medically stable to d/c.   Objective: Blood pressure (!) 151/114, pulse 84, temperature 98.2 F (36.8 C), temperature source Oral, resp. rate (!) 22, height 6\' 1"  (1.854 m), weight (!) 148.5 kg, SpO2 97 %.  Examination: General appearance: alert, cooperative, no distress and morbidly obese Head: Normocephalic, without obvious abnormality, atraumatic Eyes: EOMI Lungs: clear to auscultation bilaterally Heart: regular rate and rhythm and S1, S2 normal Abdomen: obese, soft, NT, ND, BS present Extremities: no obvious edema Skin: mobility and turgor normal Neurologic: Grossly normal  Consultants:   none  Procedures:   08/07/20, TTE  Data Reviewed: I have personally reviewed following labs and imaging studies Results for orders placed or performed during the hospital encounter of 08/06/20 (from the past 24 hour(s))  Basic metabolic panel     Status: Abnormal   Collection Time: 08/09/20  6:21 AM  Result Value Ref Range   Sodium 137 135 - 145 mmol/L   Potassium 3.4 (L) 3.5 - 5.1 mmol/L  Chloride 103 98 - 111 mmol/L   CO2 24 22 - 32 mmol/L   Glucose, Bld 119 (H) 70 - 99 mg/dL   BUN 22 (H) 6 - 20 mg/dL   Creatinine, Ser 8.88 (H) 0.61 - 1.24 mg/dL   Calcium 8.9 8.9 - 28.0 mg/dL   GFR calc non Af Amer 59 (L) >60 mL/min   GFR calc Af Amer >60 >60 mL/min   Anion gap 10 5 - 15  CBC with Differential/Platelet     Status: None   Collection Time: 08/09/20  6:21 AM  Result Value Ref Range   WBC 6.4 4.0 - 10.5 K/uL   RBC 5.15 4.22 - 5.81 MIL/uL   Hemoglobin 14.5 13.0 - 17.0 g/dL   HCT 03.4 39 - 52 %   MCV 89.1 80.0 - 100.0 fL   MCH 28.2 26.0 - 34.0 pg   MCHC 31.6 30.0 - 36.0 g/dL   RDW 91.7 91.5 - 05.6 %   Platelets 296 150 - 400 K/uL    nRBC 0.0 0.0 - 0.2 %   Neutrophils Relative % 47 %   Neutro Abs 3.0 1.7 - 7.7 K/uL   Lymphocytes Relative 38 %   Lymphs Abs 2.4 0.7 - 4.0 K/uL   Monocytes Relative 10 %   Monocytes Absolute 0.7 0 - 1 K/uL   Eosinophils Relative 4 %   Eosinophils Absolute 0.3 0 - 0 K/uL   Basophils Relative 1 %   Basophils Absolute 0.1 0 - 0 K/uL   Immature Granulocytes 0 %   Abs Immature Granulocytes 0.02 0.00 - 0.07 K/uL  Magnesium     Status: None   Collection Time: 08/09/20  6:21 AM  Result Value Ref Range   Magnesium 2.2 1.7 - 2.4 mg/dL    Recent Results (from the past 240 hour(s))  SARS Coronavirus 2 by RT PCR (hospital order, performed in [facility] hospital lab) Nasopharyngeal Nasopharyngeal Swab     Status: None   Collection Time: 08/06/20 11:44 AM   Specimen: Nasopharyngeal Swab  Result Value Ref Range Status   SARS Coronavirus 2 NEGATIVE NEGATIVE Final    Comment: (NOTE) SARS-CoV-2 target nucleic acids are NOT DETECTED.  The SARS-CoV-2 RNA is generally detectable in upper and lower respiratory specimens during the acute phase of infection. The lowest concentration of SARS-CoV-2 viral copies this assay can detect is 250 copies / mL. A negative result does not preclude SARS-CoV-2 infection and should not be used as the sole basis for treatment or other patient management decisions.  A negative result may occur with improper specimen collection / handling, submission of specimen other than nasopharyngeal swab, presence of viral mutation(s) within the areas targeted by this assay, and inadequate number of viral copies (<250 copies / mL). A negative result must be combined with clinical observations, patient history, and epidemiological information.  Fact Sheet for Patients:   BoilerBrush.com.cy  Fact Sheet for Healthcare Providers: https://pope.com/  This test is not yet approved or  cleared by the [place] FDA and has been  authorized for detection and/or diagnosis of SARS-CoV-2 by FDA under an Emergency Use Authorization (EUA).  This EUA will remain in effect (meaning this test can be used) for the duration of the COVID-19 declaration under Section 564(b)(1) of the Act, 21 U.S.C. section 360bbb-3(b)(1), unless the authorization is terminated or revoked sooner.  Performed at [facility], [address] [place], [place] [address]      Radiology Studies: No results found.  CT ANGIO CHEST PE W OR WO CONTRAST  Final Result    DG Chest 2 View  Final Result      Scheduled Meds: . atorvastatin  40 mg Oral Daily  . carvedilol  25 mg Oral BID WC  . enoxaparin (LOVENOX) injection  40 mg Subcutaneous Q24H  . furosemide  40 mg Intravenous Daily  . sacubitril-valsartan  1 tablet Oral BID  . sodium chloride flush  3 mL Intravenous Q12H  . spironolactone  25 mg Oral Daily   PRN Meds: sodium chloride, acetaminophen, hydrALAZINE, labetalol, sodium chloride flush Continuous Infusions: . sodium chloride        LOS: 2 days  Time spent: Greater than 50% of the 35 minute visit was spent in counseling/coordination of care for the patient as laid out in the A&P.   Lewie Chamber, MD [facility] 08/09/2020, 2:04 PM  Contact via secure chat.  To contact the attending provider between 7A-7P or the covering provider during after hours 7P-7A, please log into the web site [email or web address] and access using universal [place] password for that web site. If you do not have the password, please call the hospital operator.

## 2020-08-09 NOTE — H&P (View-Only) (Signed)
Progress Note  Patient Name: Steve Carrillo Date of Encounter: 08/09/2020  [facility] Cardiologist: Thurmon Fair, MD   Subjective   Feels better. In/out not recorded. Weight yesterday unchanged from admission. No weight today. BUN and creat unchanged. For first time since admission, DBP<100.  Inpatient Medications    Scheduled Meds: . atorvastatin  40 mg Oral Daily  . carvedilol  25 mg Oral BID WC  . enoxaparin (LOVENOX) injection  40 mg Subcutaneous Q24H  . furosemide  40 mg Intravenous Daily  . sacubitril-valsartan  1 tablet Oral BID  . sodium chloride flush  3 mL Intravenous Q12H  . spironolactone  25 mg Oral Daily   Continuous Infusions: . sodium chloride     PRN Meds: sodium chloride, acetaminophen, hydrALAZINE, labetalol, sodium chloride flush   Vital Signs    Vitals:   08/08/20 2041 08/09/20 0516 08/09/20 0611 08/09/20 0648  BP: (!) 148/104 (!) 152/106 (!) 153/113 131/90  Pulse: 90 85 86 83  Resp: (!) 24     Temp: 98.2 F (36.8 C) 98.2 F (36.8 C)    TempSrc: Oral Oral    SpO2: 97% 95%    Weight:      Height:       No intake or output data in the 24 hours ending 08/09/20 0827 Last 3 Weights 08/08/2020 08/07/2020 08/06/2020  Weight (lbs) 330 lb 11 oz 332 lb 14.3 oz 330 lb  Weight (kg) 150 kg 151 kg 149.687 kg      Telemetry    NSR; one brief episode of Mobitz I second degree AV block around 8AM today - Personally Reviewed  ECG    NSR, biatrial enlargement, LVH, long QT - Personally Reviewed  Physical Exam  Obese GEN: No acute distress.   Neck: No JVD Cardiac: RRR, no murmurs, rubs, or gallops.  Respiratory: Clear to auscultation bilaterally. GI: Soft, nontender, non-distended  MS: No edema; No deformity. Neuro:  Nonfocal  Psych: Normal affect   Labs    High Sensitivity Troponin:   Recent Labs  Lab 08/06/20 1144 08/06/20 1410 08/07/20 1552  TROPONINIHS 44* 33* 28*      Chemistry Recent Labs  Lab 08/07/20 0557 08/08/20 0603  08/09/20 0621  NA 138 143 137  K 3.4* 3.5 3.4*  CL 105 106 103  CO2 25 26 24   GLUCOSE 111* 119* 119*  BUN 19 21* 22*  CREATININE 1.40* 1.50* 1.49*  CALCIUM 8.9 9.0 8.9  GFRNONAA >60 58* 59*  GFRAA >60 >60 >60  ANIONGAP 8 11 10      Hematology Recent Labs  Lab 08/06/20 1144 08/08/20 0603 08/09/20 0621  WBC 6.9 6.1 6.4  RBC 5.28 4.82 5.15  HGB 15.3 14.0 14.5  HCT 46.3 43.2 45.9  MCV 87.7 89.6 89.1  MCH 29.0 29.0 28.2  MCHC 33.0 32.4 31.6  RDW 13.4 13.4 13.3  PLT 287 269 296    BNP Recent Labs  Lab 08/06/20 1144  BNP 485.5*     DDimer  Recent Labs  Lab 08/06/20 1144  DDIMER 0.71*     Radiology    ECHOCARDIOGRAM COMPLETE  Result Date: 08/07/2020    ECHOCARDIOGRAM REPORT   Patient Name:   Steve Carrillo Date of Exam: 08/07/2020 Medical Rec #:  [name]      Height:       73.0 in Accession #:    10/07/2020     Weight:       330.0 lb Date of Birth:  [birth year]  BSA:          2.662 m Patient Age:    38 years       BP:           141/95 mmHg Patient Gender: M              HR:           90 bpm. Exam Location:  Inpatient Procedure: 2D Echo, Cardiac Doppler and Color Doppler Indications:    Congestive Heart Failure 428.0 / I50.9  History:        Patient has prior history of Echocardiogram examinations, most                 recent 02/07/2016. CHF and Cardiomyopathy, Signs/Symptoms:Chest                 Pain; Risk Factors:Hypertension, Dyslipidemia and Former Smoker.                 Dilated aortic root.  Sonographer:    Renella Cunas RDCS Referring Phys: [id number] JARED E SEGAL IMPRESSIONS  1. Left ventricular ejection fraction, by estimation, is 20 to 25%. The left ventricle has severely decreased function. The left ventricle demonstrates global hypokinesis. The left ventricular internal cavity size was severely dilated. There is mild left ventricular hypertrophy. Left ventricular diastolic parameters are consistent with Grade III diastolic dysfunction (restrictive). Elevated left  atrial pressure.  2. Right ventricular systolic function is normal. The right ventricular size is normal. Tricuspid regurgitation signal is inadequate for assessing PA pressure.  3. The mitral valve is normal in structure. Mild mitral valve regurgitation.  4. The aortic valve was not well visualized. Aortic valve regurgitation is mild. No aortic stenosis is present.  5. Aortic dilatation noted. There is mild dilatation of the aortic root measuring 40 mm.  6. The inferior vena cava is dilated in size with <50% respiratory variability, suggesting right atrial pressure of 15 mmHg. FINDINGS  Left Ventricle: Left ventricular ejection fraction, by estimation, is 20 to 25%. The left ventricle has severely decreased function. The left ventricle demonstrates global hypokinesis. The left ventricular internal cavity size was severely dilated. There is mild left ventricular hypertrophy. Left ventricular diastolic parameters are consistent with Grade III diastolic dysfunction (restrictive). Elevated left atrial pressure. Right Ventricle: The right ventricular size is normal. Right vetricular wall thickness was not assessed. Right ventricular systolic function is normal. Tricuspid regurgitation signal is inadequate for assessing PA pressure. Left Atrium: Left atrial size was normal in size. Right Atrium: Right atrial size was normal in size. Pericardium: Trivial pericardial effusion is present. Mitral Valve: The mitral valve is normal in structure. Mild mitral valve regurgitation. Tricuspid Valve: The tricuspid valve is normal in structure. Tricuspid valve regurgitation is trivial. Aortic Valve: The aortic valve was not well visualized. Aortic valve regurgitation is mild. No aortic stenosis is present. Pulmonic Valve: The pulmonic valve was not well visualized. Pulmonic valve regurgitation is trivial. Aorta: Aortic dilatation noted. There is mild dilatation of the aortic root measuring 40 mm. Venous: The inferior vena cava is  dilated in size with less than 50% respiratory variability, suggesting right atrial pressure of 15 mmHg. IAS/Shunts: The interatrial septum was not well visualized.  LEFT VENTRICLE PLAX 2D LVIDd:         7.20 cm      Diastology LVIDs:         6.50 cm      LV e' lateral:   7.80 cm/s LV PW:  1.10 cm      LV E/e' lateral: 11.5 LV IVS:        1.10 cm      LV e' medial:    6.05 cm/s LVOT diam:     2.50 cm      LV E/e' medial:  14.8 LV SV:         66 LV SV Index:   25 LVOT Area:     4.91 cm  LV Volumes (MOD) LV vol d, MOD A2C: 297.0 ml LV vol d, MOD A4C: 309.0 ml LV vol s, MOD A2C: 245.0 ml LV vol s, MOD A4C: 233.0 ml LV SV MOD A2C:     52.0 ml LV SV MOD A4C:     309.0 ml LV SV MOD BP:      80.0 ml RIGHT VENTRICLE RV S prime:     12.40 cm/s TAPSE (M-mode): 2.1 cm LEFT ATRIUM             Index       RIGHT ATRIUM           Index LA diam:        5.40 cm 2.03 cm/m  RA Area:     14.90 cm LA Vol (A2C):   63.8 ml 23.97 ml/m RA Volume:   37.10 ml  13.94 ml/m LA Vol (A4C):   66.0 ml 24.79 ml/m LA Biplane Vol: 65.4 ml 24.57 ml/m  AORTIC VALVE LVOT Vmax:   96.00 cm/s LVOT Vmean:  67.900 cm/s LVOT VTI:    0.135 m  AORTA Ao Root diam: 4.00 cm MITRAL VALVE MV Area (PHT): 6.83 cm    SHUNTS MV Decel Time: 111 msec    Systemic VTI:  0.14 m MR Peak grad: 82.4 mmHg    Systemic Diam: 2.50 cm MR Vmax:      454.00 cm/s MV E velocity: 89.50 cm/s MV A velocity: 38.60 cm/s MV E/A ratio:  2.32 Epifanio Lescheshristopher Schumann MD Electronically signed by Epifanio Lescheshristopher Schumann MD Signature Date/Time: 08/07/2020/1:44:19 PM    Final     Cardiac Studies   As above  Patient Profile     39 y.o. male with a hx of chronic combined systolic and diastolic heart failure, CKD 2, morbid obesity, hypertension, hyperlipidemia, prediabetes admitted with severe HTN and HF exacerbation.   Assessment & Plan    Acute on chronic combined systolic and diastolic heart failure: EF 35-40%  In 2017, now 20-25%, after being off all meds for months.  Cardiac  catheterization in 2015 did not show any coronary artery disease. Restarted on Entresto, spironolactone, and carvedilol. - rec'd IV diuretics - no record of output. Continue today, hold AM dose for cath and then restart on IV/PO diuretics based on right heart cath pressures  -  carvedilol 25 mg twice daily,  spironolactone 25 mg daily, Entresto titrated to 49-51 mg twice daily yesterday.  Will need to discuss with case manager to ensure patient can get his meds as outpatient, if unable to get entresto would switch to losartan -  RHC/LHC tomorrow. This procedure has been fully reviewed with the patient and written informed consent has been obtained.  Hypertension: malignant, with HF and CKD.  OSA: likely contributing to uncontrolled hypertension, will need sleep study as outpatient.  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 Continue carvedilol.  Troponin elevation: likely due to HTN/HF exacerbation, but will have cath for CAD tomorrow  Hyperlipidemia: LDL 107, continue atorvastatin 40 mg. If CAD iis  identified, increase lipid lowering meds to target LDL<70.  Morbid obesity: Body mass index is 43.92 kg/m.  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  Will monitor with diuresis.  QT prolongation: QTc 521 on EKG.  K still mildly low, MG ok. Recheck when K is normal, the degree of QT prolongation appears disproportionate.     For questions or updates, please contact [facility] Please consult [email or web address] for contact info under        Signed, Thurmon Fair, MD  08/09/2020, 8:27 AM

## 2020-08-10 ENCOUNTER — Encounter (HOSPITAL_COMMUNITY)
Admission: EM | Disposition: A | Discharge status: Home / Self Care | Payer: Self-pay | Source: Home / Self Care | Attending: Internal Medicine

## 2020-08-10 HISTORY — PX: RIGHT/LEFT HEART CATH AND CORONARY ANGIOGRAPHY: CATH118266

## 2020-08-10 LAB — BASIC METABOLIC PANEL
Anion gap: 8 (ref 5–15)
BUN: 24 mg/dL — ABNORMAL HIGH (ref 6–20)
CO2: 24 mmol/L (ref 22–32)
Calcium: 8.8 mg/dL — ABNORMAL LOW (ref 8.9–10.3)
Chloride: 105 mmol/L (ref 98–111)
Creatinine, Ser: 1.55 mg/dL — ABNORMAL HIGH (ref 0.61–1.24)
GFR calc Af Amer: >60 mL/min (ref >60)
GFR calc non Af Amer: 56 mL/min — ABNORMAL LOW (ref >60)
Glucose, Bld: 112 mg/dL — ABNORMAL HIGH (ref 70–99)
Potassium: 3.5 mmol/L (ref 3.5–5.1)
Sodium: 137 mmol/L (ref 135–145)

## 2020-08-10 LAB — CBC WITH DIFFERENTIAL/PLATELET
Abs Immature Granulocytes: 0.02 10*3/uL (ref 0.00–0.07)
Basophils Absolute: 0.1 10*3/uL (ref 0.0–0.1)
Basophils Relative: 1 %
Eosinophils Absolute: 0.2 10*3/uL (ref 0.0–0.5)
Eosinophils Relative: 4 %
HCT: 45.6 % (ref 39.0–52.0)
Hemoglobin: 14.6 g/dL (ref 13.0–17.0)
Immature Granulocytes: 0 %
Lymphocytes Relative: 37 %
Lymphs Abs: 2.2 10*3/uL (ref 0.7–4.0)
MCH: 28.3 pg (ref 26.0–34.0)
MCHC: 32 g/dL (ref 30.0–36.0)
MCV: 88.4 fL (ref 80.0–100.0)
Monocytes Absolute: 0.6 10*3/uL (ref 0.1–1.0)
Monocytes Relative: 10 %
Neutro Abs: 2.9 10*3/uL (ref 1.7–7.7)
Neutrophils Relative %: 48 %
Platelets: 298 10*3/uL (ref 150–400)
RBC: 5.16 MIL/uL (ref 4.22–5.81)
RDW: 13.5 % (ref 11.5–15.5)
WBC: 5.9 10*3/uL (ref 4.0–10.5)
nRBC: 0 % (ref 0.0–0.2)

## 2020-08-10 LAB — POCT I-STAT 7, (LYTES, BLD GAS, ICA,H+H)
Acid-base deficit: 2 mmol/L (ref 0.0–2.0)
Bicarbonate: 23 mmol/L (ref 20.0–28.0)
Calcium, Ion: 1.22 mmol/L (ref 1.15–1.40)
HCT: 43 % (ref 39.0–52.0)
Hemoglobin: 14.6 g/dL (ref 13.0–17.0)
O2 Saturation: 98 %
Potassium: 3.7 mmol/L (ref 3.5–5.1)
Sodium: 141 mmol/L (ref 135–145)
TCO2: 24 mmol/L (ref 22–32)
pCO2 arterial: 39.5 mmHg (ref 32.0–48.0)
pH, Arterial: 7.373 (ref 7.350–7.450)
pO2, Arterial: 109 mmHg — ABNORMAL HIGH (ref 83.0–108.0)

## 2020-08-10 LAB — POCT I-STAT EG7
Acid-base deficit: 1 mmol/L (ref 0.0–2.0)
Bicarbonate: 24.9 mmol/L (ref 20.0–28.0)
Calcium, Ion: 1.23 mmol/L (ref 1.15–1.40)
HCT: 43 % (ref 39.0–52.0)
Hemoglobin: 14.6 g/dL (ref 13.0–17.0)
O2 Saturation: 68 %
Potassium: 3.7 mmol/L (ref 3.5–5.1)
Sodium: 139 mmol/L (ref 135–145)
TCO2: 26 mmol/L (ref 22–32)
pCO2, Ven: 45.2 mmHg (ref 44.0–60.0)
pH, Ven: 7.349 (ref 7.250–7.430)
pO2, Ven: 38 mmHg (ref 32.0–45.0)

## 2020-08-10 LAB — MAGNESIUM: Magnesium: 2.1 mg/dL (ref 1.7–2.4)

## 2020-08-10 SURGERY — RIGHT/LEFT HEART CATH AND CORONARY ANGIOGRAPHY
Anesthesia: LOCAL

## 2020-08-10 MED ORDER — SODIUM CHLORIDE 0.9% FLUSH
3.0000 mL | Freq: Two times a day (BID) | INTRAVENOUS | Status: DC
  Administered 2020-08-10 – 2020-08-12 (×3): 3 mL via INTRAVENOUS

## 2020-08-10 MED ORDER — SODIUM CHLORIDE 0.9 % IV SOLN: 250.0000 mL | INTRAVENOUS | Status: DC | PRN

## 2020-08-10 MED ORDER — LIDOCAINE HCL (PF) 1 % IJ SOLN
INTRAMUSCULAR | Status: AC
  Filled 2020-08-10: qty 30

## 2020-08-10 MED ORDER — MIDAZOLAM HCL 2 MG/2ML IJ SOLN
INTRAMUSCULAR | Status: DC | PRN
  Administered 2020-08-10: 2 mg via INTRAVENOUS

## 2020-08-10 MED ORDER — CHLORHEXIDINE GLUCONATE CLOTH 2 % EX PADS
6.0000 | MEDICATED_PAD | Freq: Once | CUTANEOUS | Status: AC
  Administered 2020-08-10: 6 via TOPICAL

## 2020-08-10 MED ORDER — ASPIRIN 81 MG PO CHEW
81.0000 mg | CHEWABLE_TABLET | ORAL | Status: AC
  Administered 2020-08-10: 81 mg via ORAL
  Filled 2020-08-10: qty 1

## 2020-08-10 MED ORDER — FENTANYL CITRATE (PF) 100 MCG/2ML IJ SOLN
INTRAMUSCULAR | Status: DC | PRN
Start: 2020-08-10 — End: 2020-08-10
  Administered 2020-08-10: 25 ug via INTRAVENOUS

## 2020-08-10 MED ORDER — ENOXAPARIN SODIUM 80 MG/0.8ML ~~LOC~~ SOLN: 0.5000 mg/kg | SUBCUTANEOUS | Status: DC

## 2020-08-10 MED ORDER — SODIUM CHLORIDE 0.9% FLUSH: 3.0000 mL | INTRAVENOUS | Status: DC | PRN

## 2020-08-10 MED ORDER — IOHEXOL 350 MG/ML SOLN
INTRAVENOUS | Status: DC | PRN
  Administered 2020-08-10: 70 mL via INTRA_ARTERIAL

## 2020-08-10 MED ORDER — HEPARIN (PORCINE) IN NACL 1000-0.9 UT/500ML-% IV SOLN
INTRAVENOUS | Status: DC | PRN
  Administered 2020-08-10 (×2): 500 mL

## 2020-08-10 MED ORDER — VERAPAMIL HCL 2.5 MG/ML IV SOLN
INTRAVENOUS | Status: AC
  Filled 2020-08-10: qty 2

## 2020-08-10 MED ORDER — HEPARIN SODIUM (PORCINE) 1000 UNIT/ML IJ SOLN
INTRAMUSCULAR | Status: AC
  Filled 2020-08-10: qty 1

## 2020-08-10 MED ORDER — HEPARIN (PORCINE) IN NACL 1000-0.9 UT/500ML-% IV SOLN
INTRAVENOUS | Status: AC
  Filled 2020-08-10: qty 1000

## 2020-08-10 MED ORDER — ENOXAPARIN SODIUM 40 MG/0.4ML ~~LOC~~ SOLN
40.0000 mg | SUBCUTANEOUS | Status: DC
  Administered 2020-08-11: 40 mg via SUBCUTANEOUS
  Filled 2020-08-10: qty 0.4

## 2020-08-10 MED ORDER — SODIUM CHLORIDE 0.9 % IV SOLN: INTRAVENOUS | Status: DC

## 2020-08-10 MED ORDER — FENTANYL CITRATE (PF) 100 MCG/2ML IJ SOLN
INTRAMUSCULAR | Status: AC
  Filled 2020-08-10: qty 2

## 2020-08-10 MED ORDER — VERAPAMIL HCL 2.5 MG/ML IV SOLN
INTRAVENOUS | Status: DC | PRN
  Administered 2020-08-10: 10 mL via INTRA_ARTERIAL

## 2020-08-10 MED ORDER — LIDOCAINE HCL (PF) 1 % IJ SOLN
INTRAMUSCULAR | Status: DC | PRN
  Administered 2020-08-10 (×2): 2 mL via SUBCUTANEOUS

## 2020-08-10 MED ORDER — MIDAZOLAM HCL 2 MG/2ML IJ SOLN
INTRAMUSCULAR | Status: AC
  Filled 2020-08-10: qty 2

## 2020-08-10 MED ORDER — HEPARIN SODIUM (PORCINE) 1000 UNIT/ML IJ SOLN
INTRAMUSCULAR | Status: DC | PRN
  Administered 2020-08-10: 7000 [IU] via INTRAVENOUS

## 2020-08-10 SURGICAL SUPPLY — 11 items
CATH BALLN WEDGE 5F 110CM (CATHETERS) ×1 IMPLANT
CATH INFINITI 5FR MULTPACK ANG (CATHETERS) ×1 IMPLANT
DEVICE RAD COMP TR BAND LRG (VASCULAR PRODUCTS) ×1 IMPLANT
GLIDESHEATH SLEND SS 6F .021 (SHEATH) ×1 IMPLANT
GUIDEWIRE INQWIRE 1.5J.035X260 (WIRE) IMPLANT
INQWIRE 1.5J .035X260CM (WIRE) ×2
KIT HEART LEFT (KITS) ×2 IMPLANT
PACK CARDIAC CATHETERIZATION (CUSTOM PROCEDURE TRAY) ×2 IMPLANT
SHEATH GLIDE SLENDER 4/5FR (SHEATH) ×1 IMPLANT
TRANSDUCER W/STOPCOCK (MISCELLANEOUS) ×2 IMPLANT
TUBING CIL FLEX 10 FLL-RA (TUBING) ×2 IMPLANT

## 2020-08-10 NOTE — Progress Notes (Signed)
Patient received from [facility] via EMS transport. Consent confirmed. Denies chest pain or shortness of breath at this time. VS obtained. IV flushed and patent with Normal saline infusing @ 28ml upon arrival to cath lab holding area. Transported to cath lab 1.Steve Carrillo

## 2020-08-10 NOTE — Progress Notes (Signed)
TR BAND REMOVAL  LOCATION:    Right radial  DEFLATED PER PROTOCOL:    Yes.    TIME BAND OFF / DRESSING APPLIED:    1130am Clean dry dressing with gauze and tegaderm.  Covered with coban.   SITE UPON ARRIVAL:    Level 0  SITE AFTER BAND REMOVAL:    Level 0  CIRCULATION SENSATION AND MOVEMENT:    Within Normal Limits   Yes.    COMMENTS:   Care instruction given to patient.

## 2020-08-10 NOTE — Progress Notes (Signed)
Patient not seen. Chart reviewed.   Cath showed moderately elevated LV pressures and PWCP of 21 mmHg, PASP 47 mmHg.   No epicardial coronary disease.   Medical therapy for nonischemic CM. Continue meds and diuresis, will reassess tomorrow.   Parke Poisson, MD

## 2020-08-10 NOTE — Interval H&P Note (Signed)
History and Physical Interval Note:  08/10/2020 9:06 AM  Steve Carrillo  has presented today for surgery, with the diagnosis of HF.  The various methods of treatment have been discussed with the patient and family. After consideration of risks, benefits and other options for treatment, the patient has consented to  Procedure(s): RIGHT/LEFT HEART CATH AND CORONARY ANGIOGRAPHY (N/A) as a surgical intervention.  The patient's history has been reviewed, patient examined, no change in status, stable for surgery.  I have reviewed the patient's chart and labs.  Questions were answered to the patient's satisfaction.   Cath Lab Visit (complete for each Cath Lab visit)  Clinical Evaluation Leading to the Procedure:   ACS: No.  Non-ACS:    Anginal Classification: CCS II  Anti-ischemic medical therapy: Minimal Therapy (1 class of medications)  Non-Invasive Test Results: No non-invasive testing performed  Prior CABG: No previous CABG        [name] [facility] 08/10/2020 9:06 AM

## 2020-08-10 NOTE — Progress Notes (Signed)
PROGRESS NOTE  Steve Carrillo OMV:[id number] DOB: 1981-06-23 DOA: 08/06/2020 PCP: Etta Grandchild, MD  Brief History   Steve Carrillo is a 39 yo AA male with PMH combined systolic diastolic CHF (previous EF 35-40%, Gr II DD on March 2017 echo), CKDII, obesity, mild dilated aortic root, HTN, HLD, prediabetes who presented to the hospital with tightness in his chest, SOB, and overall feeling bad. He has lost his insurance recently and has been out of medications for approximately 2 months he stated.  He was found to have uncontrolled BP on workup in the ER 193/147 with tachycardia and tachypnea.  He had an elevated d-dimer 0.71 and trop 33, thus underwent CTA to rule out PE which was negative for PE but did show small bilateral pleural effusions, moderate cardiomegaly, and pulmonary venous hypertension with interstitial edema. COVID-19 negative.  Last cardiology note reviewed from Dr. Royann Shivers on 08/27/19.  He has previously undergone a sleep study which showed mild OSA and CPAP was not recommended. 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  His BNP was also elevated 485 (not likely to be due to [facility] given off med ~2 mths) and given his presentation he was admitted for a CHF exacerbation.  He was given Lasix 40 mg IV in the ER but does not think he voided a significant amount from this dose. He also does not weigh himself routinely at home nor check his blood pressure.  Does not have a cuff or scale but states that he plans to get them at discharge if needed.  Cardiology was consulted during hospitalization and recommended LHC/RHC to evaluate for ischemic workup and due to his worsening EF on echo (20-25%). This was done this morning. No epicardial CAD was found.   Interval History:  Admitted with worsening shortness of breath at home and some chest tightness.  Found to have worsening of his CHF.   Echo showed worsening EF on  repeat this admission.  Overall, he is breathing and feeling better with diuresis and lowering of blood pressure.  Cardiology was consulted. The patient underwent Right and Left Heart Cath this morning. It demonstrated moderately elevated LV pressures and PWCP of 21 mmHg, PASP 47 mmHg. There was no epicardial coronary disease. Non ischemic cardiomyopathy.  Old records reviewed in assessment of this patient  Consultants  . Cardiology  Procedures  . RHC/LHC  Antibiotics   Anti-infectives (From admission, onward)   None    .  Subjective  The patient is resting comfortably. No new complaints.  Objective   Vitals:  Vitals:   08/10/20 1233 08/10/20 1700  BP: (!) 140/101 (!) 154/117  Pulse: 86 87  Resp: 18 (!) 22  Temp: 98.2 F (36.8 C) 98.6 F (37 C)  SpO2: 99% 100%   Exam:  Constitutional:  . The patient is awake, alert, and oriented x 3. No acute distress. Respiratory:  . No increased work of breathing. . No wheezes, rales, or rhonchi . No tactile fremitus Cardiovascular:  . Regular rate and rhythm . No murmurs, ectopy, or gallups. . No lateral PMI. No thrills. Abdomen:  . Abdomen is soft, non-tender, non-distended . No hernias, masses, or organomegaly . Normoactive bowel sounds.  Musculoskeletal:  . No cyanosis, clubbing, or edema Skin:  . No rashes, lesions, ulcers . palpation of skin: no induration or nodules Neurologic:  . CN 2-12 intact . Sensation all 4 extremities intact Psychiatric:  . Mental status o Mood,  affect appropriate o Orientation to person, place, time  . judgment and insight appear intact  I have personally reviewed the following:   Today's Data  . Vitals, BMP, CBC  Cardiology Data  . RHC/LHC  Scheduled Meds: . atorvastatin  40 mg Oral Daily  . carvedilol  25 mg Oral BID WC  . [START ON 08/11/2020] enoxaparin (LOVENOX) injection  40 mg Subcutaneous Q24H  . furosemide  40 mg Intravenous Daily  . isosorbide-hydrALAZINE  1 tablet  Oral TID  . sacubitril-valsartan  1 tablet Oral BID  . sodium chloride flush  3 mL Intravenous Q12H  . sodium chloride flush  3 mL Intravenous Q12H  . spironolactone  25 mg Oral Daily   Continuous Infusions: . sodium chloride    . sodium chloride      Principal Problem:   Acute on chronic combined systolic and diastolic CHF (congestive heart failure) (HCC) Active Problems:   Prediabetes   OSA (obstructive sleep apnea)   Essential hypertension, malignant   Chronic kidney disease, stage 2, mildly decreased GFR   Morbid obesity (HCC)   Hyperlipidemia   Abnormal CT of the chest   Hypokalemia   LOS: 3 days    A & P   Acute on chronic combined systolic and diastolic CHF (congestive heart failure) (HCC): 2017 echo: EF 35-40%, Gr 2 DD. EF further reduced, 20 to 25% and now grade 3 diastolic dysfunction.  Not surprising with ongoing medication noncompliance at home due to financial constraints unfortunately. Given further worsening of EF, will reengage cardiology while patient hospitalized. Now at higher risk for spontaneous arrhythmia; may need consideration for AICD however suspect he may need further trial of medication compliance to see if EF responds again. He does need significant medication assistance especially with Entresto. Appreciate cardiology consult; patient tentatively planned for LHC/RHC on Tuesday given worsened EF/ischemic evaluation. The atient has been resumed on entresto (increased further per cardiology), coreg, spironolactone (doubtful for entresto affordability at discharge so likely plan on giving losartan script instead). Continue diuresis, lipitor. Monitor volume status. SW consult for financial assistance at discharge; will need to follow up with [facility]. Consideration of starting Jardiance, however will await further plans especially given financial constraints with medications.  Chest pain: No further chest pain. Considered due to CHF exac and hypertensive  urgency/emergency. CTA chest negative for PE. Troponins downtrending; EKG reviewed, no ischemic changes. Repeat EKG if worsening CP  Hyperlipidemia: Continue lipitor. CHFL 149, LDL 107, TG 74. In the past was also on Vascepa per cardiology.  Morbid obesity (HCC): Body mass index is 43.54 kg/m. RD consulted for further patient education. Patient does not follow heart healthy diet.   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 Separate 0.5 cm sub solid nodule in the right lower lobe. These may represent areas of slightly more confluent edema are nonspecific. Based on current guidelines, CT chest without contrast is recommended in 6 months in order to assess for persistence.  Prediabetes: HbA1c 5.7.  Hypokalemia: Supplement and monitor.  Chronic kidney disease, stage 2, mildly decreased GFR: Baseline creatinine 1.4-1.6, currently at baseline. Likely due to HTN. Avoid nephrotoxic agents and hypotension as possible.   Hypertensive urgency, malignant: Due to noncompliance with home meds from loss of insurance. Continue Coreg and Entresto. Use labetalol or hydralazine as needed. Diastolic pressures elevated. Will add HCTZ.  Essential hypertension, malignant: See hypertensive urgency will continue on chronic regimen  once blood pressure stabilized.  OSA (obstructive sleep apnea): Per cardiology notes, has had a sleep study in the past and was found to have mild OSA with no indication for CPAP. May need repeat sleep study outpatient at some point  I have seen and examined this patient myself. I have spent 34 minutes in his evaluation and care.  DVT prophylaxis: Lovenox Code Status: Full Family Communication: Wife  Disposition Plan: Status is: Inpatient  Remains inpatient appropriate because:IV treatments appropriate due to intensity of illness or inability to take PO and Inpatient level of care  appropriate due to severity of illness   Dispo: The patient is from: Home  Anticipated d/c is to: Home  Anticipated d/c date is: 2 days  Patient currently is not medically stable to d/c.   Lainy Wrobleski, DO [facility] Direct contact: see [email or web address]  7PM-7AM contact night coverage as above 08/10/2020, 5:42 PM  LOS: 3 days

## 2020-08-11 ENCOUNTER — Encounter (HOSPITAL_COMMUNITY): Payer: Self-pay | Admitting: Cardiology

## 2020-08-11 LAB — CBC WITH DIFFERENTIAL/PLATELET
Abs Immature Granulocytes: 0.05 10*3/uL (ref 0.00–0.07)
Basophils Absolute: 0.1 10*3/uL (ref 0.0–0.1)
Basophils Relative: 1 %
Eosinophils Absolute: 0.2 10*3/uL (ref 0.0–0.5)
Eosinophils Relative: 3 %
HCT: 42.7 % (ref 39.0–52.0)
Hemoglobin: 13.5 g/dL (ref 13.0–17.0)
Immature Granulocytes: 1 %
Lymphocytes Relative: 32 %
Lymphs Abs: 2 10*3/uL (ref 0.7–4.0)
MCH: 28.1 pg (ref 26.0–34.0)
MCHC: 31.6 g/dL (ref 30.0–36.0)
MCV: 88.8 fL (ref 80.0–100.0)
Monocytes Absolute: 0.6 10*3/uL (ref 0.1–1.0)
Monocytes Relative: 10 %
Neutro Abs: 3.3 10*3/uL (ref 1.7–7.7)
Neutrophils Relative %: 53 %
Platelets: 287 10*3/uL (ref 150–400)
RBC: 4.81 MIL/uL (ref 4.22–5.81)
RDW: 13.6 % (ref 11.5–15.5)
WBC: 6.2 10*3/uL (ref 4.0–10.5)
nRBC: 0 % (ref 0.0–0.2)

## 2020-08-11 LAB — BASIC METABOLIC PANEL
Anion gap: 9 (ref 5–15)
BUN: 20 mg/dL (ref 6–20)
CO2: 24 mmol/L (ref 22–32)
Calcium: 8.9 mg/dL (ref 8.9–10.3)
Chloride: 107 mmol/L (ref 98–111)
Creatinine, Ser: 1.49 mg/dL — ABNORMAL HIGH (ref 0.61–1.24)
GFR calc Af Amer: >60 mL/min (ref >60)
GFR calc non Af Amer: 59 mL/min — ABNORMAL LOW (ref >60)
Glucose, Bld: 118 mg/dL — ABNORMAL HIGH (ref 70–99)
Potassium: 3.6 mmol/L (ref 3.5–5.1)
Sodium: 140 mmol/L (ref 135–145)

## 2020-08-11 LAB — MAGNESIUM: Magnesium: 2 mg/dL (ref 1.7–2.4)

## 2020-08-11 MED ORDER — FUROSEMIDE 40 MG PO TABS
40.0000 mg | ORAL_TABLET | Freq: Every day | ORAL | Status: DC
  Administered 2020-08-12: 40 mg via ORAL
  Filled 2020-08-11: qty 1

## 2020-08-11 MED ORDER — FUROSEMIDE 40 MG PO TABS: 40.0000 mg | ORAL_TABLET | Freq: Every day | ORAL | Status: DC

## 2020-08-11 MED ORDER — FUROSEMIDE 10 MG/ML IJ SOLN
40.0000 mg | Freq: Once | INTRAMUSCULAR | Status: AC
  Administered 2020-08-11: 40 mg via INTRAVENOUS
  Filled 2020-08-11: qty 4

## 2020-08-11 MED ORDER — ENOXAPARIN SODIUM 80 MG/0.8ML ~~LOC~~ SOLN
0.5000 mg/kg | SUBCUTANEOUS | Status: DC
  Administered 2020-08-12: 75 mg via SUBCUTANEOUS
  Filled 2020-08-11: qty 0.8

## 2020-08-11 NOTE — Progress Notes (Addendum)
Progress Note  Patient Name: Steve Carrillo Date of Encounter: 08/11/2020  [facility] Cardiologist: Thurmon Fair, MD   Subjective   Doing well after heart cath yesterday. Appear near euvolemic, although elevated LVEDP and moderate pulmonary hypertension yesterday. No weights or I&Os entered.   Inpatient Medications    Scheduled Meds:  atorvastatin  40 mg Oral Daily   carvedilol  25 mg Oral BID WC   enoxaparin (LOVENOX) injection  40 mg Subcutaneous Q24H   furosemide  40 mg Intravenous Daily   isosorbide-hydrALAZINE  1 tablet Oral TID   sacubitril-valsartan  1 tablet Oral BID   sodium chloride flush  3 mL Intravenous Q12H   sodium chloride flush  3 mL Intravenous Q12H   spironolactone  25 mg Oral Daily   Continuous Infusions:  sodium chloride     sodium chloride     PRN Meds: sodium chloride, sodium chloride, acetaminophen, hydrALAZINE, labetalol, sodium chloride flush, sodium chloride flush   Vital Signs    Vitals:   08/10/20 1853 08/10/20 2052 08/11/20 0026 08/11/20 0526  BP: 123/72 135/83 136/83 137/82  Pulse:  96 93 86  Resp:  20 18 18   Temp:  97.9 F (36.6 C) 98.7 F (37.1 C) 98.7 F (37.1 C)  TempSrc:  Oral Oral Oral  SpO2:  97% 98% 97%  Weight:      Height:        Intake/Output Summary (Last 24 hours) at 08/11/2020 0746 Last data filed at 08/11/2020 0200 Gross per 24 hour  Intake 120 ml  Output 425 ml  Net -305 ml   Last 3 Weights 08/10/2020 08/09/2020 08/08/2020  Weight (lbs) 328 lb 14.8 oz 327 lb 6.1 oz 330 lb 11 oz  Weight (kg) 149.2 kg 148.5 kg 150 kg      Telemetry    Sinus rhythm with HR 80-90s - Personally Reviewed  ECG    No new tracings - Personally Reviewed  Physical Exam   GEN: No acute distress.   Neck: + JVD Cardiac: RRR, no murmurs, rubs, or gallops.  Respiratory: Clear to auscultation bilaterally. GI: Soft, nontender, non-distended  MS: No edema; No deformity. Neuro:  Nonfocal  Psych: Normal affect   Labs      High Sensitivity Troponin:   Recent Labs  Lab 08/06/20 1144 08/06/20 1410 08/07/20 1552  TROPONINIHS 44* 33* 28*      Chemistry Recent Labs  Lab 08/09/20 0621 08/09/20 0621 08/10/20 0533 08/10/20 0533 08/10/20 0921 08/10/20 0924 08/11/20 0539  NA 137   < > 137   < > 141 139 140  K 3.4*   < > 3.5   < > 3.7 3.7 3.6  CL 103  --  105  --   --   --  107  CO2 24  --  24  --   --   --  24  GLUCOSE 119*  --  112*  --   --   --  118*  BUN 22*  --  24*  --   --   --  20  CREATININE 1.49*  --  1.55*  --   --   --  1.49*  CALCIUM 8.9  --  8.8*  --   --   --  8.9  GFRNONAA 59*  --  56*  --   --   --  59*  GFRAA >60  --  >60  --   --   --  >60  ANIONGAP 10  --  8  --   --   --  9   < > = values in this interval not displayed.     Hematology Recent Labs  Lab 08/09/20 4742 08/09/20 5956 08/10/20 0533 08/10/20 0533 08/10/20 0921 08/10/20 0924 08/11/20 0539  WBC 6.4  --  5.9  --   --   --  6.2  RBC 5.15  --  5.16  --   --   --  4.81  HGB 14.5   < > 14.6   < > 14.6 14.6 13.5  HCT 45.9   < > 45.6   < > 43.0 43.0 42.7  MCV 89.1  --  88.4  --   --   --  88.8  MCH 28.2  --  28.3  --   --   --  28.1  MCHC 31.6  --  32.0  --   --   --  31.6  RDW 13.3  --  13.5  --   --   --  13.6  PLT 296  --  298  --   --   --  287   < > = values in this interval not displayed.    BNP Recent Labs  Lab 08/06/20 1144  BNP 485.5*     DDimer  Recent Labs  Lab 08/06/20 1144  DDIMER 0.71*     Radiology    CARDIAC CATHETERIZATION  Result Date: 08/10/2020  LV end diastolic pressure is moderately elevated.  Hemodynamic findings consistent with mild pulmonary hypertension.  1. Very large coronary arteries- normal. 2. Moderately elevated LV filling pressures 3. Mild pulmonary HTN. 4. Reduced cardiac output. Index 2.23 Plan: optimize medical therapy for CHF    Cardiac Studies   Right and left heart cath 08/10/20:  LV end diastolic pressure is moderately elevated.  Hemodynamic findings  consistent with mild pulmonary hypertension.   1. Very large coronary arteries- normal. 2. Moderately elevated LV filling pressures 3. Mild pulmonary HTN.  4. Reduced cardiac output. Index 2.23  Plan: optimize medical therapy for CHF   Echo 08/07/20: 1. Left ventricular ejection fraction, by estimation, is 20 to 25%. The  left ventricle has severely decreased function. The left ventricle  demonstrates global hypokinesis. The left ventricular internal cavity size  was severely dilated. There is mild  left ventricular hypertrophy. Left ventricular diastolic parameters are  consistent with Grade III diastolic dysfunction (restrictive). Elevated  left atrial pressure.  2. Right ventricular systolic function is normal. The right ventricular  size is normal. Tricuspid regurgitation signal is inadequate for assessing  PA pressure.  3. The mitral valve is normal in structure. Mild mitral valve  regurgitation.  4. The aortic valve was not well visualized. Aortic valve regurgitation  is mild. No aortic stenosis is present.  5. Aortic dilatation noted. There is mild dilatation of the aortic root  measuring 40 mm.  6. The inferior vena cava is dilated in size with <50% respiratory  variability, suggesting right atrial pressure of 15 mmHg.    Patient Profile     39 y.o. male with a hx of chronic combined systolic and diastolic heart failure, CKD 2, morbid obesity, hypertension, hyperlipidemia, prediabetes, and noncompliance admitted with severe HTN and HF exacerbation after being off all medications for 2 months.  Assessment & Plan    Acute on chronic systolic and diastolic heart failure Nonischemic cardiomyopathy - EF in 2017 was 35-40% with no CAD by cath in 2015, now 20-25% with grade 3 DD -  restarted home medications with up-titration - right and left heart cath yesterday showed no obstructive CAD and elevated LVEDP and moderate pulmonary hypertension - 25 mg coreg BID, bidil  20-37.5 TID, entresto 49-51 mg BID, spiro 25 mg daily - I have consulted CM for help with medications - I&Os incomplete and no recent weight- needs to weigh today  - continuing to diurese on 40 mg IV lasix for today and start 40 mg lasix daily tomorrow - discharge on the above medications   Hypertensive urgency - medications as above - BP seems to finally be controlled in the 130s/80s   Hyperlipidemia 08/06/2020: Cholesterol 149; HDL 27; LDL Cholesterol 107; Triglycerides 74; VLDL 15 - continue lipitor 40 mg   Morbid obesity - needs to exercise   CKD stage II - sCr 1.49 today following heart cath - at baseline   Disposition: Suspect he may discharge in the next 24 hrs once access to medications has been established. He may qualify for medicaid since he is only working part time at this point, but that application process is not fast. He has been applying to job openings.  I have arranged cardiology follow up. I will collect a BMP next week at his follow up visit.  Suspect he will need to establish with community health and wellness for PCP and medication needs.    For questions or updates, please contact [facility] Please consult [email or web address] for contact info under        Signed, Marcelino Duster, PA  08/11/2020, 7:46 AM    Patient seen and examined with Bettina Gavia PA.  Agree as above, with the following exceptions and changes as noted below. Feels well overall, anticipating dc. Gen: NAD, CV: RRR, no murmurs, Lungs: clear, Abd: soft, Extrem: Warm, well perfused, no edema, Neuro/Psych: alert and oriented x 3, normal mood and affect. All available labs, radiology testing, previous records reviewed. Will give lasix 40 mg IV bid today and transition to oral tomorrow, lasix 40 mg daily. I stressed the importance of follow up in our office for HF therapy. On GMDT now, good regimen with adequate BP control as above.   Cardiology will sign off at this time.   Parke Poisson, MD 08/11/20 2:00 PM

## 2020-08-11 NOTE — Progress Notes (Signed)
PROGRESS NOTE  Steve Carrillo PZW:[id number] DOB: September 23, 1981 DOA: 08/06/2020 PCP: Etta Grandchild, MD  Brief History   Steve Carrillo is a 39 yo AA male with PMH combined systolic diastolic CHF (previous EF 35-40%, Gr II DD on March 2017 echo), CKDII, obesity, mild dilated aortic root, HTN, HLD, prediabetes who presented to the hospital with tightness in his chest, SOB, and overall feeling bad. He has lost his insurance recently and has been out of medications for approximately 2 months he stated.  He was found to have uncontrolled BP on workup in the ER 193/147 with tachycardia and tachypnea. He had an elevated d-dimer 0.71 and trop 33, thus underwent CTA to rule out PE which was negative for PE but did show small bilateral pleural effusions, moderate cardiomegaly, and pulmonary venous hypertension with interstitial edema. COVID-19 negative.  Last cardiology note reviewed from Dr. Royann Shivers on 08/27/19.  He has previously undergone a sleep study which showed mild OSA and CPAP was not recommended. 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  His BNP was also elevated 485 (not likely to be due to [facility] given off med ~2 mths) and given his presentation he was admitted for a CHF exacerbation.  He was given Lasix 40 mg IV in the ER but does not think he voided a significant amount from this dose. He also does not weigh himself routinely at home nor check his blood pressure.  Does not have a cuff or scale but states that he plans to get them at discharge if needed.  Cardiology was consulted during hospitalization and recommended LHC/RHC to evaluate for ischemic workup and due to his worsening EF on echo (20-25%). This was done this morning. No epicardial CAD was found.   Interval History:  Admitted with worsening shortness of breath at home and some chest tightness.  Found to have worsening of his CHF.   Echo showed worsening EF on  repeat this admission.  Overall, he is breathing and feeling better with diuresis and lowering of blood pressure.  Cardiology was consulted. The patient underwent Right and Left Heart Cath this morning. It demonstrated moderately elevated LV pressures and PWCP of 21 mmHg, PASP 47 mmHg. There was no epicardial coronary disease. Non ischemic cardiomyopathy.  Diuresis has been continued by cardiology. TOC has been consulted for assistance in helping the patient get access to his medications.  Old records reviewed in assessment of this patient  Consultants  . Cardiology  Procedures  . RHC/LHC  Antibiotics   Anti-infectives (From admission, onward)   None     Subjective  The patient is resting comfortably. No new complaints.  Objective   Vitals:  Vitals:   08/11/20 0837 08/11/20 1540  BP: (!) 164/116 (!) 143/92  Pulse: 91 84  Resp: 19 20  Temp: 98 F (36.7 C) 98.9 F (37.2 C)  SpO2: 99% 97%   Exam:  Constitutional:  . The patient is awake, alert, and oriented x 3. No acute distress. Respiratory:  . No increased work of breathing. . No wheezes, rales, or rhonchi . No tactile fremitus Cardiovascular:  . Regular rate and rhythm . No murmurs, ectopy, or gallups. . No lateral PMI. No thrills. Abdomen:  . Abdomen is soft, non-tender, non-distended . No hernias, masses, or organomegaly . Normoactive bowel sounds.  Musculoskeletal:  . No cyanosis, clubbing, or edema Skin:  . No rashes, lesions, ulcers . palpation of skin: no induration or nodules  Neurologic:  . CN 2-12 intact . Sensation all 4 extremities intact Psychiatric:  . Mental status o Mood, affect appropriate o Orientation to person, place, time  . judgment and insight appear intact  I have personally reviewed the following:   Today's Data  . Vitals, BMP, CBC  Cardiology Data  . RHC/LHC  Scheduled Meds: . atorvastatin  40 mg Oral Daily  . carvedilol  25 mg Oral BID WC  . [START ON 08/12/2020]  enoxaparin (LOVENOX) injection  0.5 mg/kg Subcutaneous Q24H  . [START ON 08/12/2020] furosemide  40 mg Oral Daily  . isosorbide-hydrALAZINE  1 tablet Oral TID  . sacubitril-valsartan  1 tablet Oral BID  . sodium chloride flush  3 mL Intravenous Q12H  . sodium chloride flush  3 mL Intravenous Q12H  . spironolactone  25 mg Oral Daily   Continuous Infusions: . sodium chloride    . sodium chloride      Principal Problem:   Acute on chronic combined systolic and diastolic CHF (congestive heart failure) (HCC) Active Problems:   Prediabetes   OSA (obstructive sleep apnea)   Essential hypertension, malignant   Chronic kidney disease, stage 2, mildly decreased GFR   Morbid obesity (HCC)   Hyperlipidemia   Abnormal CT of the chest   Hypokalemia   LOS: 4 days    A & P   Acute on chronic combined systolic and diastolic CHF (congestive heart failure) (HCC): 2017 echo: EF 35-40%, Gr 2 DD. EF further reduced, 20 to 25% and now grade 3 diastolic dysfunction.  Not surprising with ongoing medication noncompliance at home due to financial constraints unfortunately. Given further worsening of EF, will reengage cardiology while patient hospitalized. Now at higher risk for spontaneous arrhythmia; may need consideration for AICD however suspect he may need further trial of medication compliance to see if EF responds again. He does need significant medication assistance especially with Entresto. Appreciate cardiology consult; patient tentatively planned for LHC/RHC on Tuesday given worsened EF/ischemic evaluation. The atient has been resumed on entresto (increased further per cardiology), coreg, spironolactone (doubtful for entresto affordability at discharge so likely plan on giving losartan script instead). Continue diuresis, lipitor. Monitor volume status. SW consult for financial assistance at discharge; will need to follow up with [facility]. Consideration of starting Jardiance, however will await further plans  especially given financial constraints with medications. TOC has been consulted to assist the patient with access to his medications.  Chest pain: No further chest pain. Considered due to CHF exac and hypertensive urgency/emergency. CTA chest negative for PE. Troponins downtrending; EKG reviewed, no ischemic changes. Repeat EKG if worsening CP.  Hyperlipidemia: Continue lipitor. CHFL 149, LDL 107, TG 74. In the past was also on Vascepa per cardiology.  Morbid obesity (HCC): Body mass index is 43.54 kg/m. RD consulted for further patient education. Patient does not follow heart healthy diet.   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 Separate 0.5 cm sub solid nodule in the right lower lobe. These may represent areas of slightly more confluent edema are nonspecific. Based on current guidelines, CT chest without contrast is recommended in 6 months in order to assess for persistence.  Prediabetes: HbA1c 5.7.  Hypokalemia: Supplement and monitor.  Chronic kidney disease, stage 2, mildly decreased GFR: Baseline creatinine 1.4-1.6, currently at baseline. Likely due to HTN. Avoid nephrotoxic agents and hypotension as possible.   Hypertensive urgency, malignant: Due to noncompliance  with home meds from loss of insurance. Continue Coreg and Entresto. Use labetalol or hydralazine as needed. Diastolic pressures elevated. Will add HCTZ.  Essential hypertension, malignant: See hypertensive urgency will continue on chronic regimen once blood pressure stabilized.  OSA (obstructive sleep apnea): Per cardiology notes, has had a sleep study in the past and was found to have mild OSA with no indication for CPAP. May need repeat sleep study outpatient at some point  I have seen and examined this patient myself. I have spent 32 minutes in his evaluation and care.  DVT prophylaxis: Lovenox Code Status:  Full Family Communication: None available Disposition Plan: Home Status is: Inpatient  Remains inpatient appropriate because:IV treatments appropriate due to intensity of illness or inability to take PO and Inpatient level of care appropriate due to severity of illness   Dispo: The patient is from: Home  Anticipated d/c is to: Home  Anticipated d/c date is: 1 days  Patient currently is not medically stable to d/c.   Amadi Yoshino, DO [facility] Direct contact: see [email or web address]  7PM-7AM contact night coverage as above 08/11/2020, 4:47 PM  LOS: 3 days

## 2020-08-12 LAB — BASIC METABOLIC PANEL
Anion gap: 8 (ref 5–15)
BUN: 23 mg/dL — ABNORMAL HIGH (ref 6–20)
CO2: 24 mmol/L (ref 22–32)
Calcium: 9.1 mg/dL (ref 8.9–10.3)
Chloride: 106 mmol/L (ref 98–111)
Creatinine, Ser: 1.83 mg/dL — ABNORMAL HIGH (ref 0.61–1.24)
GFR calc Af Amer: 53 mL/min — ABNORMAL LOW (ref >60)
GFR calc non Af Amer: 46 mL/min — ABNORMAL LOW (ref >60)
Glucose, Bld: 119 mg/dL — ABNORMAL HIGH (ref 70–99)
Potassium: 3.5 mmol/L (ref 3.5–5.1)
Sodium: 138 mmol/L (ref 135–145)

## 2020-08-12 LAB — CBC WITH DIFFERENTIAL/PLATELET
Abs Immature Granulocytes: 0.02 10*3/uL (ref 0.00–0.07)
Basophils Absolute: 0.1 10*3/uL (ref 0.0–0.1)
Basophils Relative: 1 %
Eosinophils Absolute: 0.2 10*3/uL (ref 0.0–0.5)
Eosinophils Relative: 3 %
HCT: 44.9 % (ref 39.0–52.0)
Hemoglobin: 14.3 g/dL (ref 13.0–17.0)
Immature Granulocytes: 0 %
Lymphocytes Relative: 34 %
Lymphs Abs: 2.2 10*3/uL (ref 0.7–4.0)
MCH: 28.3 pg (ref 26.0–34.0)
MCHC: 31.8 g/dL (ref 30.0–36.0)
MCV: 88.9 fL (ref 80.0–100.0)
Monocytes Absolute: 0.6 10*3/uL (ref 0.1–1.0)
Monocytes Relative: 9 %
Neutro Abs: 3.5 10*3/uL (ref 1.7–7.7)
Neutrophils Relative %: 53 %
Platelets: 317 10*3/uL (ref 150–400)
RBC: 5.05 MIL/uL (ref 4.22–5.81)
RDW: 13.9 % (ref 11.5–15.5)
WBC: 6.6 10*3/uL (ref 4.0–10.5)
nRBC: 0 % (ref 0.0–0.2)

## 2020-08-12 LAB — MAGNESIUM: Magnesium: 2.3 mg/dL (ref 1.7–2.4)

## 2020-08-12 MED ORDER — FUROSEMIDE 40 MG PO TABS: 40.0000 mg | ORAL_TABLET | Freq: Every day | ORAL | 0 refills | Status: DC

## 2020-08-12 MED ORDER — ISOSORB DINITRATE-HYDRALAZINE 20-37.5 MG PO TABS: 1.0000 | ORAL_TABLET | Freq: Three times a day (TID) | ORAL | 0 refills | Status: DC

## 2020-08-12 NOTE — Progress Notes (Signed)
Pt to be discharged to home this evening. Pt and Pt's Wife given discharge teaching including all Medications and schedules for these Medications. Home Heart Failure teaching reviewed with the Pt and Pt's Wife. Understanding verbalized. Discharge packet with Medication assistance coupons provided by Child psychotherapist with Pt at time of discharge

## 2020-08-12 NOTE — Discharge Summary (Signed)
Physician Discharge Summary  ABBY STINES QIH:[id number] DOB: [birth year] DOA: 08/06/2020  PCP: Etta Grandchild, MD  Admit date: 08/06/2020 Discharge date: 08/12/2020  Recommendations for Outpatient Follow-up:  1. Discharge to home 2. Follow up with PCP in 7-10 days. Have chemistry checked at this visit. 3. Follow up with cardiology as directed.   Follow-up Information    Schedule an appointment as soon as possible for a visit  with Etta Grandchild, MD.   Specialty: Internal Medicine Contact information: [address] [place] [address] [phone]        Schedule an appointment as soon as possible for a visit  with Thurmon Fair, MD.   Specialty: Cardiology Contact information: [address] [place] [address] [phone]        [facility]. Call in 1 day(s).   Why: Call as soon as possible to get established with a PCP. Contact information: [address] [place] [address] [phone]             Discharge Diagnoses: Principal diagnosis is #1 Acute on chronic combined systolic and diastolic CHF (congestive heart failure) (HCC) Prediabetes OSA (obstructive sleep apnea) Essential hypertension, malignant Chronic kidney disease, stage 2, mildly decreased GFR Morbid obesity (HCC) Hyperlipidemia Abnormal CT of the chest Hypokalemia  Discharge Condition: Fair  Disposition: Home  Diet recommendation: Heart healthy/Modified carbohydrates  Filed Weights   08/09/20 0815 08/10/20 0500 08/12/20 0540  Weight: (!) 148.5 kg (!) 149.2 kg (!) 148.1 kg    History of present illness:  This is a 39 year old male with past medical history of severe systemic hypertension, hypertensive cardiomyopathy, combined systolic and diastolic heart failure (last EF 35 to 40% in 2017, sees Dr. Jamse Belfast), hyperlipidemia, morbid obesity who presented to the ED this a.m. with chest pain, shortness of  breath and cough for the last 2 or 3 days.  Chest pain has been central and intermittent.  Has had associated dyspnea on exertion as well as a dry cough.  Also needing to elevate the head of his recliner where he sleeps more often.  Admitted to frequently eating fried foods.  Also has not had his heart failure medications for the past 2 months due to financial/insurance issues and has not seen his PCP for the same reasons.  ED Course: Vitals: T 98.6, HR 114, RR 22, BP 193/147 (BP at cardio office in October 181/123).  Notable labs: Glucose 118, creatinine 1.46 (at baseline), BNP 45, troponin 44-> 33, D-dimer 0.71, COVID-19 negative.  CXR: Cardiomegaly, central pulmonary vascular congestion and interstitial pulmonary edema.  In the ED he was restarted on his home Coreg, Entresto, spironolactone and given nitroglycerin as well as labetalol 10 mg x 1.  Hospital Course: Mr. Reckart is a 39 yo AA male with PMH combined systolic diastolic CHF (previous EF 35-40%, Gr II DD on March 2017 echo), CKDII, obesity, mild dilated aortic root, HTN, HLD, prediabetes who presented to the hospital with tightness in his chest, SOB, and overall feeling bad. He has lost his insurance recently and has been out of medications for approximately 2 months he stated.  He was found to have uncontrolled BP on workup in the ER 193/147 with tachycardia and tachypnea. He had an elevated d-dimer 0.71 and trop 33, thus underwent CTA to rule out PE which was negative for PE but did show small bilateral pleural effusions, moderate cardiomegaly, and pulmonary venous hypertension with interstitial edema. COVID-19 negative.  Last cardiology note reviewed from Dr. Royann Shiversroitoru on 08/27/19.  He has previously undergone a sleep study which showed mild OSA and CPAP was not recommended. 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  His BNP was also elevated 485 (not likely to be due to  [facility]) and given his presentation he was admitted for a CHF exacerbation.  He was given Lasix 40 mg IV in the ER but does not think he voided a significant amount from this dose. He also does not weigh himself routinely at home nor check his blood pressure. Does not have a cuff or scale but states that he plans to get them at discharge if needed.  Cardiology was consulted during hospitalization and recommended LHC/RHC to evaluate for ischemic workup and due to his worsening EF on echo (20-25%). This was done this morning. No epicardial CAD was found.  The patient has been successfully diuresed. As per cardiology he has been converted to lasix 40 mg daily. He will follow up with cardiology as outpatient. Social work has been asked to assist the patient with accessing his medications.  Today's assessment: S: The patient is resting comfortably. No new complaints. O: Vitals:  Vitals:   08/12/20 0540 08/12/20 1241  BP: (!) 133/99 (!) 135/96  Pulse: 88 87  Resp: 20   Temp: 98.2 F (36.8 C) 98.5 F (36.9 C)  SpO2: 97%    Exam:  Constitutional:   The patient is awake, alert, and oriented x 3. No acute distress. Respiratory:   No increased work of breathing.  No wheezes, rales, or rhonchi  No tactile fremitus Cardiovascular:   Regular rate and rhythm  No murmurs, ectopy, or gallups.  No lateral PMI. No thrills. Abdomen:   Abdomen is soft, non-tender, non-distended  No hernias, masses, or organomegaly  Normoactive bowel sounds.  Musculoskeletal:   No cyanosis, clubbing, or edema Skin:   No rashes, lesions, ulcers  palpation of skin: no induration or nodules Neurologic:   CN 2-12 intact  Sensation all 4 extremities intact Psychiatric:   Mental status ? Mood, affect appropriate ? Orientation to person, place, time   judgment and insight appear intact  Discharge Instructions  Discharge Instructions    Activity as tolerated - No  restrictions   Complete by: As directed    Call MD for:  persistant nausea and vomiting   Complete by: As directed    Call MD for:  severe uncontrolled pain   Complete by: As directed    Diet - low sodium heart healthy   Complete by: As directed    Discharge instructions   Complete by: As directed    Discharge to home Follow up with PCP in 7-10 days. Have chemistry checked at this visit. Follow up with cardiology as directed.   Increase activity slowly   Complete by: As directed      Allergies as of 08/12/2020   No Known Allergies     Medication List    TAKE these medications   atorvastatin 40 MG tablet Commonly known as: LIPITOR Take 1 tablet (40 mg total) by mouth daily. Notes to patient: Last Dose of this Medication was given on September 9,2021 at 9:17 am   carvedilol 25 MG tablet Commonly known as: COREG Take 1 tablet (25 mg total) by mouth 2 (two) times daily. Notes to patient: Last Dose of this Medication was given on September 9.2021 at 7:35 am  Entresto 24-26 MG Generic drug: sacubitril-valsartan Take 1 tablet by mouth 2 (two) times daily. Notes to patient: Last Dose of this Medication was given on August 12, 2020 at 9:17 am   furosemide 40 MG tablet Commonly known as: LASIX Take 1 tablet (40 mg total) by mouth daily. Start taking on: August 13, 2020 Notes to patient: Last Dose of this Medication was given on September 9. 2021 at 9:17 am    isosorbide-hydrALAZINE 20-37.5 MG tablet Commonly known as: BIDIL Take 1 tablet by mouth 3 (three) times daily. Notes to patient: Last Dose of this Medication was given on August 12, 2020 at 9:17 am   spironolactone 25 MG tablet Commonly known as: ALDACTONE Take 1 tablet (25 mg total) by mouth daily. Notes to patient: Last Dose of this Medication was given on August 12, 2020 at 9:17 am    Vascepa 1 g capsule Generic drug: icosapent Ethyl Take 2 capsules (2 g total) by mouth 2 (two) times daily.      No  Known Allergies  The results of significant diagnostics from this hospitalization (including imaging, microbiology, ancillary and laboratory) are listed below for reference.    Significant Diagnostic Studies: DG Chest 2 View  Result Date: 08/06/2020 CLINICAL DATA:  Shortness of breath EXAM: CHEST - 2 VIEW COMPARISON:  11/11/2018 chest radiograph and prior. FINDINGS: Cardiomegaly with central pulmonary venous congestion. Diffuse interstitial prominence. No focal consolidation. No pneumothorax. Trace left pleural effusion. No acute osseous abnormalities. IMPRESSION: Cardiomegaly, central pulmonary vascular congestion and interstitial pulmonary edema. Electronically Signed   By: Stana Bunting M.D.   On: 08/06/2020 08:46   CT ANGIO CHEST PE W OR WO CONTRAST  Result Date: 08/06/2020 CLINICAL DATA:  Elevated D-dimer. Chest pain and shortness of breath EXAM: CT ANGIOGRAPHY CHEST WITH CONTRAST TECHNIQUE: Multidetector CT imaging of the chest was performed using the standard protocol during bolus administration of intravenous contrast. Multiplanar CT image reconstructions and MIPs were obtained to evaluate the vascular anatomy. CONTRAST:  OMNIPAQUE IOHEXOL 350 MG/ML SOLN COMPARISON:  Chest radiograph 08/06/2020 FINDINGS: Cardiovascular: No filling defect is identified in the pulmonary arterial tree to suggest pulmonary embolus. Moderate cardiomegaly especially involving the left heart. Mediastinum/Nodes: Prevascular node 1.0 cm in short axis on image 45/4. Suspected additional mildly enlarged paratracheal and subcarinal lymph nodes, although indistinctly marginated. Lungs/Pleura: Small bilateral pleural effusions, nonspecific for transudative or exudative etiology. Subsegmental atelectasis or scarring anteriorly in the left lower lobe. Prominence of vessels favoring pulmonary venous hypertension Ground-glass density posteriorly in the right upper lobe measuring 1.5 by 0.9 cm on image 65/6. Sub solid  right lower lobe nodule 0.5 cm in diameter on image 89/6. Subtle interstitial accentuation. Upper Abdomen: Unremarkable Musculoskeletal: Unremarkable Review of the MIP images confirms the above findings. IMPRESSION: 1. No filling defect is identified in the pulmonary arterial tree to suggest pulmonary embolus. 2. Moderate cardiomegaly especially involving the left heart. Pulmonary venous hypertension and faint interstitial edema. 3. Small bilateral pleural effusions, nonspecific for transudative or exudative etiology. 4. 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 Separate 0.5 cm sub solid nodule in the right lower lobe. These may represent areas of slightly more confluent edema are nonspecific. Based on current guidelines, CT chest without contrast is recommended in 6 months in order to assess for persistence. 5. Subsegmental atelectasis or scarring anteriorly in the left lower lobe. Electronically Signed   By: Gaylyn Rong M.D.   On: 08/06/2020  18:18   CARDIAC CATHETERIZATION  Result Date: 08/10/2020  LV end diastolic pressure is moderately elevated.  Hemodynamic findings consistent with mild pulmonary hypertension.  1. Very large coronary arteries- normal. 2. Moderately elevated LV filling pressures 3. Mild pulmonary HTN. 4. Reduced cardiac output. Index 2.23 Plan: optimize medical therapy for CHF   ECHOCARDIOGRAM COMPLETE  Result Date: 08/07/2020    ECHOCARDIOGRAM REPORT   Patient Name:   DEUCE PATERNOSTER Date of Exam: 08/07/2020 Medical Rec #:  [id number]      Height:       73.0 in Accession #:    [id number]     Weight:       330.0 lb Date of Birth:  May 04, 1981     BSA:          2.662 m Patient Age:    38 years       BP:           141/95 mmHg Patient Gender: M              HR:           90 bpm. Exam Location:  Inpatient Procedure: 2D Echo, Cardiac Doppler and Color Doppler Indications:    Congestive Heart Failure 428.0 / I50.9   History:        Patient has prior history of Echocardiogram examinations, most                 recent 02/07/2016. CHF and Cardiomyopathy, Signs/Symptoms:Chest                 Pain; Risk Factors:Hypertension, Dyslipidemia and Former Smoker.                 Dilated aortic root.  Sonographer:    Renella Cunas RDCS Referring Phys: [id number] JARED E SEGAL IMPRESSIONS  1. Left ventricular ejection fraction, by estimation, is 20 to 25%. The left ventricle has severely decreased function. The left ventricle demonstrates global hypokinesis. The left ventricular internal cavity size was severely dilated. There is mild left ventricular hypertrophy. Left ventricular diastolic parameters are consistent with Grade III diastolic dysfunction (restrictive). Elevated left atrial pressure.  2. Right ventricular systolic function is normal. The right ventricular size is normal. Tricuspid regurgitation signal is inadequate for assessing PA pressure.  3. The mitral valve is normal in structure. Mild mitral valve regurgitation.  4. The aortic valve was not well visualized. Aortic valve regurgitation is mild. No aortic stenosis is present.  5. Aortic dilatation noted. There is mild dilatation of the aortic root measuring 40 mm.  6. The inferior vena cava is dilated in size with <50% respiratory variability, suggesting right atrial pressure of 15 mmHg. FINDINGS  Left Ventricle: Left ventricular ejection fraction, by estimation, is 20 to 25%. The left ventricle has severely decreased function. The left ventricle demonstrates global hypokinesis. The left ventricular internal cavity size was severely dilated. There is mild left ventricular hypertrophy. Left ventricular diastolic parameters are consistent with Grade III diastolic dysfunction (restrictive). Elevated left atrial pressure. Right Ventricle: The right ventricular size is normal. Right vetricular wall thickness was not assessed. Right ventricular systolic function is normal. Tricuspid  regurgitation signal is inadequate for assessing PA pressure. Left Atrium: Left atrial size was normal in size. Right Atrium: Right atrial size was normal in size. Pericardium: Trivial pericardial effusion is present. Mitral Valve: The mitral valve is normal in structure. Mild mitral valve regurgitation. Tricuspid Valve: The tricuspid valve is normal in structure. Tricuspid valve regurgitation is  trivial. Aortic Valve: The aortic valve was not well visualized. Aortic valve regurgitation is mild. No aortic stenosis is present. Pulmonic Valve: The pulmonic valve was not well visualized. Pulmonic valve regurgitation is trivial. Aorta: Aortic dilatation noted. There is mild dilatation of the aortic root measuring 40 mm. Venous: The inferior vena cava is dilated in size with less than 50% respiratory variability, suggesting right atrial pressure of 15 mmHg. IAS/Shunts: The interatrial septum was not well visualized.  LEFT VENTRICLE PLAX 2D LVIDd:         7.20 cm      Diastology LVIDs:         6.50 cm      LV e' lateral:   7.80 cm/s LV PW:         1.10 cm      LV E/e' lateral: 11.5 LV IVS:        1.10 cm      LV e' medial:    6.05 cm/s LVOT diam:     2.50 cm      LV E/e' medial:  14.8 LV SV:         66 LV SV Index:   25 LVOT Area:     4.91 cm  LV Volumes (MOD) LV vol d, MOD A2C: 297.0 ml LV vol d, MOD A4C: 309.0 ml LV vol s, MOD A2C: 245.0 ml LV vol s, MOD A4C: 233.0 ml LV SV MOD A2C:     52.0 ml LV SV MOD A4C:     309.0 ml LV SV MOD BP:      80.0 ml RIGHT VENTRICLE RV S prime:     12.40 cm/s TAPSE (M-mode): 2.1 cm LEFT ATRIUM             Index       RIGHT ATRIUM           Index LA diam:        5.40 cm 2.03 cm/m  RA Area:     14.90 cm LA Vol (A2C):   63.8 ml 23.97 ml/m RA Volume:   37.10 ml  13.94 ml/m LA Vol (A4C):   66.0 ml 24.79 ml/m LA Biplane Vol: 65.4 ml 24.57 ml/m  AORTIC VALVE LVOT Vmax:   96.00 cm/s LVOT Vmean:  67.900 cm/s LVOT VTI:    0.135 m  AORTA Ao Root diam: 4.00 cm MITRAL VALVE MV Area (PHT): 6.83  cm    SHUNTS MV Decel Time: 111 msec    Systemic VTI:  0.14 m MR Peak grad: 82.4 mmHg    Systemic Diam: 2.50 cm MR Vmax:      454.00 cm/s MV E velocity: 89.50 cm/s MV A velocity: 38.60 cm/s MV E/A ratio:  2.32 Epifanio Lesches MD Electronically signed by Epifanio Lesches MD Signature Date/Time: 08/07/2020/1:44:19 PM    Final     Microbiology: Recent Results (from the past 240 hour(s))  SARS Coronavirus 2 by RT PCR (hospital order, performed in [facility] hospital lab) Nasopharyngeal Nasopharyngeal Swab     Status: None   Collection Time: 08/06/20 11:44 AM   Specimen: Nasopharyngeal Swab  Result Value Ref Range Status   SARS Coronavirus 2 NEGATIVE NEGATIVE Final    Comment: (NOTE) SARS-CoV-2 target nucleic acids are NOT DETECTED.  The SARS-CoV-2 RNA is generally detectable in upper and lower respiratory specimens during the acute phase of infection. The lowest concentration of SARS-CoV-2 viral copies this assay can detect is 250 copies / mL. A negative result does not  preclude SARS-CoV-2 infection and should not be used as the sole basis for treatment or other patient management decisions.  A negative result may occur with improper specimen collection / handling, submission of specimen other than nasopharyngeal swab, presence of viral mutation(s) within the areas targeted by this assay, and inadequate number of viral copies (<250 copies / mL). A negative result must be combined with clinical observations, patient history, and epidemiological information.  Fact Sheet for Patients:   BoilerBrush.com.cy  Fact Sheet for Healthcare Providers: https://pope.com/  This test is not yet approved or  cleared by the [place] FDA and has been authorized for detection and/or diagnosis of SARS-CoV-2 by FDA under an Emergency Use Authorization (EUA).  This EUA will remain in effect (meaning this test can be used) for the duration of  the COVID-19 declaration under Section 564(b)(1) of the Act, 21 U.S.C. section 360bbb-3(b)(1), unless the authorization is terminated or revoked sooner.  Performed at [facility], [address] [place], [place] [address]      Labs: Basic Metabolic Panel: Recent Labs  Lab 08/08/20 0603 08/08/20 0603 08/09/20 6237 08/09/20 6283 08/10/20 0533 08/10/20 1517 08/10/20 0924 08/11/20 0539 08/12/20 0548  NA 143   < > 137   < > 137 141 139 140 138  K 3.5   < > 3.4*   < > 3.5 3.7 3.7 3.6 3.5  CL 106  --  103  --  105  --   --  107 106  CO2 26  --  24  --  24  --   --  24 24  GLUCOSE 119*  --  119*  --  112*  --   --  118* 119*  BUN 21*  --  22*  --  24*  --   --  20 23*  CREATININE 1.50*  --  1.49*  --  1.55*  --   --  1.49* 1.83*  CALCIUM 9.0  --  8.9  --  8.8*  --   --  8.9 9.1  MG 2.3  --  2.2  --  2.1  --   --  2.0 2.3   < > = values in this interval not displayed.   Liver Function Tests: No results for input(s): AST, ALT, ALKPHOS, BILITOT, PROT, ALBUMIN in the last 168 hours. No results for input(s): LIPASE, AMYLASE in the last 168 hours. No results for input(s): AMMONIA in the last 168 hours. CBC: Recent Labs  Lab 08/08/20 0603 08/08/20 0603 08/09/20 6160 08/09/20 7371 08/10/20 0533 08/10/20 0921 08/10/20 0924 08/11/20 0539 08/12/20 0548  WBC 6.1  --  6.4  --  5.9  --   --  6.2 6.6  NEUTROABS 2.8  --  3.0  --  2.9  --   --  3.3 3.5  HGB 14.0   < > 14.5   < > 14.6 14.6 14.6 13.5 14.3  HCT 43.2   < > 45.9   < > 45.6 43.0 43.0 42.7 44.9  MCV 89.6  --  89.1  --  88.4  --   --  88.8 88.9  PLT 269  --  296  --  298  --   --  287 317   < > = values in this interval not displayed.   Cardiac Enzymes: No results for input(s): CKTOTAL, CKMB, CKMBINDEX, TROPONINI in the last 168 hours. BNP: BNP (last 3 results) Recent Labs    08/06/20 1144  BNP 485.5*  ProBNP (last 3 results) No results for input(s): PROBNP in the last 8760  hours.  CBG: No results for input(s): GLUCAP in the last 168 hours.  Principal Problem:   Acute on chronic combined systolic and diastolic CHF (congestive heart failure) (HCC) Active Problems:   Prediabetes   OSA (obstructive sleep apnea)   Essential hypertension, malignant   Chronic kidney disease, stage 2, mildly decreased GFR   Morbid obesity (HCC)   Hyperlipidemia   Abnormal CT of the chest   Hypokalemia  Time coordinating discharge: 38 minutes.  Signed:        Kennard Fildes, DO [facility]  08/12/2020, 6:10 PM

## 2020-08-12 NOTE — TOC Transition Note (Signed)
Transition of Care [facility]) - CM/SW Discharge Note   Patient Details  Name: Steve Carrillo MRN: [id number] Date of Birth: [birth year]  Transition of Care [facility]) CM/SW Contact:  Darleene Cleaver, LCSW Phone Number: 08/12/2020, 3:49 PM   Clinical Narrative:     CSW received consult that patient will need assistance with medications due to him not having any insurance.  CSW spoke to patient and provided coupons for his medications along with giving him match letter where his medications will be only three dollars each this month.  CSW explained to patient the Match letter can only be used once a year.  CSW informed patient he needs to get his medications within 7 days or else the Match letter will expire and can not be renewed.  Patient asked about if he can get his medications from the [facility], CSW informed him he needs to make an appointment and to get established with the clinic in order to get discounted medications.  CSW informed patient the contact information will be on the AVS paperwork that the nurse will give him.  Patient was appreciative of information and assistance provided.  This CSW signing off, please reconsult with other social work needs.   Final next level of care: Home/Self Care Barriers to Discharge: Barriers Resolved, Inadequate or no insurance   Patient Goals and CMS Choice Patient states their goals for this hospitalization and ongoing recovery are:: To return back home. CMS Medicare.gov Compare Post Acute Care list provided to:: Patient Choice offered to / list presented to : Patient  Discharge Placement  Home                     Discharge Plan and Services                  DME Agency: NA       HH Arranged: NA          Social Determinants of Health (SDOH) Interventions     Readmission Risk Interventions No flowsheet data found.

## 2020-08-16 ENCOUNTER — Other Ambulatory Visit: Payer: Self-pay | Admitting: Nurse Practitioner

## 2020-08-16 ENCOUNTER — Other Ambulatory Visit: Payer: Self-pay

## 2020-08-16 ENCOUNTER — Encounter: Payer: Self-pay | Admitting: Nurse Practitioner

## 2020-08-16 ENCOUNTER — Ambulatory Visit: Payer: Self-pay | Attending: Nurse Practitioner | Admitting: Nurse Practitioner

## 2020-08-16 VITALS — Ht 73.0 in | Wt 321.0 lb

## 2020-08-16 DIAGNOSIS — Z7689 Persons encountering health services in other specified circumstances: Secondary | ICD-10-CM

## 2020-08-16 DIAGNOSIS — I429 Cardiomyopathy, unspecified: Secondary | ICD-10-CM

## 2020-08-16 DIAGNOSIS — E785 Hyperlipidemia, unspecified: Secondary | ICD-10-CM

## 2020-08-16 DIAGNOSIS — I10 Essential (primary) hypertension: Secondary | ICD-10-CM

## 2020-08-16 DIAGNOSIS — E876 Hypokalemia: Secondary | ICD-10-CM

## 2020-08-16 DIAGNOSIS — I5022 Chronic systolic (congestive) heart failure: Secondary | ICD-10-CM

## 2020-08-16 DIAGNOSIS — T502X5A Adverse effect of carbonic-anhydrase inhibitors, benzothiadiazides and other diuretics, initial encounter: Secondary | ICD-10-CM

## 2020-08-16 MED ORDER — ENTRESTO 24-26 MG PO TABS: 1.0000 | ORAL_TABLET | Freq: Two times a day (BID) | ORAL | 0 refills | Status: DC

## 2020-08-16 MED ORDER — SPIRONOLACTONE 25 MG PO TABS: 25.0000 mg | ORAL_TABLET | Freq: Every day | ORAL | 0 refills | Status: DC

## 2020-08-16 MED ORDER — ATORVASTATIN CALCIUM 40 MG PO TABS: 40.0000 mg | ORAL_TABLET | Freq: Every day | ORAL | 0 refills | Status: DC

## 2020-08-16 MED ORDER — CARVEDILOL 25 MG PO TABS: 25.0000 mg | ORAL_TABLET | Freq: Two times a day (BID) | ORAL | 0 refills | Status: DC

## 2020-08-16 MED ORDER — ISOSORB DINITRATE-HYDRALAZINE 20-37.5 MG PO TABS: 1.0000 | ORAL_TABLET | Freq: Three times a day (TID) | ORAL | 0 refills | Status: DC

## 2020-08-16 MED ORDER — FUROSEMIDE 40 MG PO TABS: 40.0000 mg | ORAL_TABLET | Freq: Every day | ORAL | 0 refills | Status: DC

## 2020-08-16 MED FILL — SPIRONOLACTONE 25 MG TABLET: 25 | 30 days supply | Qty: 30 | Fill #0

## 2020-08-16 MED FILL — ATORVASTATIN CALCIUM 40 MG: 40 | 30 days supply | Qty: 30 | Fill #0

## 2020-08-16 MED FILL — FUROSEMIDE 40 MG TAB: 40 | 30 days supply | Qty: 30 | Fill #0

## 2020-08-16 MED FILL — CARVEDILOL 25 MG TABLET: 25 | 30 days supply | Qty: 60 | Fill #0

## 2020-08-16 MED FILL — BIDIL 20-37.5 MG TABS: 20-37.5 | 30 days supply | Qty: 90 | Fill #0

## 2020-08-16 MED FILL — ENTRESTO 24 MG-26 MG TABLET: 24-26 | 30 days supply | Qty: 60 | Fill #0

## 2020-08-16 NOTE — Progress Notes (Signed)
Virtual Visit via Telephone Note Due to national recommendations of social distancing due to COVID 19, telehealth visit is felt to be most appropriate for this patient at this time.  I discussed the limitations, risks, security and privacy concerns of performing an evaluation and management service by telephone and the availability of in person appointments. I also discussed with the patient that there may be a patient responsible charge related to this service. The patient expressed understanding and agreed to proceed.    I connected with Steve Carrillo on 08/16/20  at   1:30 PM EDT  EDT by telephone and verified that I am speaking with the correct person using two identifiers.   Consent I discussed the limitations, risks, security and privacy concerns of performing an evaluation and management service by telephone and the availability of in person appointments. I also discussed with the patient that there may be a patient responsible charge related to this service. The patient expressed understanding and agreed to proceed.   Location of Patient: Private Residence    Location of Provider: [facility]    Persons participating in Telemedicine visit: Bertram Denver FNP-BC YY [place] CMA Caylor J Goehring    History of Present Illness: Telemedicine visit for: Establish Care He has a history of essential hypertension, CAD, chronic systolic and diastolic heart failure with BMI 35 to 40%, grade 2 diastolic dysfunction, nonischemic dilated cardiomyopathy, moderate OSA, morbid obesity with BMI of 41, CKD stage 2, dyslipidemia and prediabetes.  Renal artery ultrasound negative for renal artery stenosis in 2015.  He has a history of nonadherence with taking his medications as well as dietary and exercise compliance.  He tells me today that he does not monitor his blood pressure at home although cardiology office reportedly mailed him a blood pressure cuff in October 2020.   Cardiology office note states that patient instructed them that he never received a blood pressure monitor device.  He was admitted to the hospital from September 3 through September 9 with acute on chronic combined systolic and diastolic congestive heart failure and malignant hypertension.  Cardiac echo in hospital showed worsening ejection fraction of 20 to 25%.  Current medications include carvedilol 25 mg twice daily, Lasix 40 mg daily, BiDil 20-37.5 mg 3 times daily, Entresto 24-26 mg twice daily and spironolactone 25 mg daily.  Today he reports compliance with taking all of his medications as well as improved dietary adherence.  He is aware he should be avoiding fried foods and adding salt to his foods as well as avoiding processed foods as much as possible. Denies currently any chest pain, worsening shortness of breath, palpitations, lightheadedness, dizziness, headaches or BLE edema.  BP Readings from Last 3 Encounters:  08/12/20 (!) 135/96  09/30/19 (!) 181/123  08/27/19 (!) 156/109    Past Medical History:  Diagnosis Date  . 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 Dilated aortic root (HCC)    seen on previous echo 02/2016  . Hyperlipidemia LDL goal <130 2015  . Hypertension   . Prediabetes     Past Surgical History:  Procedure Laterality Date  . HERNIA REPAIR    . LEFT HEART CATHETERIZATION WITH CORONARY ANGIOGRAM N/A 10/22/2014   Procedure: LEFT HEART CATHETERIZATION WITH CORONARY ANGIOGRAM;  Surgeon: Peter M Swazil[place], MD; Normal coronary anatomy. Vessels are very large due to hypertensive cardiomyopathy.   Marland Kitchen RIGHT/LEFT HEART CATH AND CORONARY ANGIOGRAPHY  N/A 08/10/2020   Procedure: RIGHT/LEFT HEART CATH AND CORONARY ANGIOGRAPHY;  Surgeon: Swazil[place], Peter M, MD;  Location: [facility] INVASIVE CV LAB;  Service: Cardiovascular;  Laterality: N/A;    Family History  Problem Relation Age of Onset  . Diabetes Mellitus II Mother    . Hypertension Mother   . Hypertension Brother   . Diabetes Mellitus II Maternal Grandmother     Social History   Socioeconomic History  . Marital status: Married    Spouse name: Not on file  . Number of children: Not on file  . Years of education: Not on file  . Highest education level: Not on file  Occupational History  . Occupation: Mining engineer: [employer]  Tobacco Use  . Smoking status: Former Smoker    Quit date: 09/14/2014    Years since quitting: 5.9  . Smokeless tobacco: Never Used  Vaping Use  . Vaping Use: Never used  Substance and Sexual Activity  . Alcohol use: Yes    Comment: Twice a month  . Drug use: Yes    Types: Marijuana  . Sexual activity: Yes  Other Topics Concern  . Not on file  Social History Narrative   Lives with girlfriend.    Social Determinants of Health   Financial Resource Strain:   . Difficulty of Paying Living Expenses: Not on file  Food Insecurity:   . Worried About Programme researcher, broadcasting/film/video in the Last Year: Not on file  . Ran Out of Food in the Last Year: Not on file  Transportation Needs:   . Lack of Transportation (Medical): Not on file  . Lack of Transportation (Non-Medical): Not on file  Physical Activity:   . Days of Exercise per Week: Not on file  . Minutes of Exercise per Session: Not on file  Stress:   . Feeling of Stress : Not on file  Social Connections:   . Frequency of Communication with Friends and Family: Not on file  . Frequency of Social Gatherings with Friends and Family: Not on file  . Attends Religious Services: Not on file  . Active Member of Clubs or Organizations: Not on file  . Attends Banker Meetings: Not on file  . Marital Status: Not on file     Observations/Objective: Awake, alert and oriented x 3   Review of Systems  Constitutional: Negative for fever, malaise/fatigue and weight loss.  HENT: Negative.  Negative for nosebleeds.   Eyes: Negative.  Negative for  blurred vision, double vision and photophobia.  Respiratory: Negative.  Negative for cough and shortness of breath.   Cardiovascular: Negative.  Negative for chest pain, palpitations and leg swelling.  Gastrointestinal: Negative.  Negative for heartburn, nausea and vomiting.  Musculoskeletal: Negative.  Negative for myalgias.  Neurological: Negative.  Negative for dizziness, focal weakness, seizures and headaches.  Psychiatric/Behavioral: Negative.  Negative for suicidal ideas.    Assessment and Plan: Miner was seen today for hospitalization follow-up.  Patient has been advised to apply for financial assistance and schedule to see our financial counselor.   Diagnoses and all orders for this visit:  Hyperlipidemia LDL goal <130 -     atorvastatin (LIPITOR) 40 MG tablet; Take 1 tablet (40 mg total) by mouth daily. INSTRUCTIONS: Work on a low fat, heart healthy diet and participate in regular aerobic exercise program by working out at least 150 minutes per week; 5 days a week-30 minutes per day. Avoid red meat/beef/steak,  fried foods. junk  foods, sodas, sugary drinks, unhealthy snacking, alcohol and smoking.  Drink at least 80 oz of water per day and monitor your carbohydrate intake daily.    Chronic systolic congestive heart failure (HCC) -     carvedilol (COREG) 25 MG tablet; Take 1 tablet (25 mg total) by mouth 2 (two) times daily. -     furosemide (LASIX) 40 MG tablet; Take 1 tablet (40 mg total) by mouth daily. -     isosorbide-hydrALAZINE (BIDIL) 20-37.5 MG tablet; Take 1 tablet by mouth 3 (three) times daily. -     sacubitril-valsartan (ENTRESTO) 24-26 MG; Take 1 tablet by mouth 2 (two) times daily. -     spironolactone (ALDACTONE) 25 MG tablet; Take 1 tablet (25 mg total) by mouth daily.  Essential hypertension, malignant -     carvedilol (COREG) 25 MG tablet; Take 1 tablet (25 mg total) by mouth 2 (two) times daily. -     isosorbide-hydrALAZINE (BIDIL) 20-37.5 MG tablet; Take 1  tablet by mouth 3 (three) times daily. -     spironolactone (ALDACTONE) 25 MG tablet; Take 1 tablet (25 mg total) by mouth daily. Continue all antihypertensives as prescribed.  Remember to bring in your blood pressure log with you for your follow up appointment.  DASH/Mediterranean Diets are healthier choices for HTN.    Cardiomyopathy, unspecified type (HCC) -     carvedilol (COREG) 25 MG tablet; Take 1 tablet (25 mg total) by mouth 2 (two) times daily.  Diuretic-induced hypokalemia -     spironolactone (ALDACTONE) 25 MG tablet; Take 1 tablet (25 mg total) by mouth daily.     Follow Up Instructions Return in about 4 weeks (around 09/13/2020) for HTN.     I discussed the assessment and treatment plan with the patient. The patient was provided an opportunity to ask questions and all were answered. The patient agreed with the plan and demonstrated an understanding of the instructions.   The patient was advised to call back or seek an in-person evaluation if the symptoms worsen or if the condition fails to improve as anticipated.  I provided 19 minutes of non-face-to-face time during this encounter including median intraservice time, reviewing previous notes, labs, imaging, medications and explaining diagnosis and management.  Claiborne Rigg, FNP-BC

## 2020-08-18 NOTE — Progress Notes (Deleted)
Cardiology Office Note:    Date:  08/18/2020   ID:  MAHAMED ZALEWSKI, DOB 1981-11-16, MRN [id number]  PCP:  Claiborne Rigg, NP  Cardiologist:  Thurmon Fair, MD   Referring MD: Etta Grandchild, MD   No chief complaint on file. ***  History of Present Illness:    Steve Carrillo is a 39 y.o. male with a hx of essentioal hypertension, hypertensive heart disease, chronic systolic and diastolic heart failure with EF of 35-40% and grade 2 DD by echo 2017, nonischemic dilated cardiomyopathy with nonobstructive CAD by heart cath in 2015, mild OSA without need for CPAP, obesity, mild CKD, HLD, and prediabetes. Renal artery [place] negative for renal artery stenosis in 2015. He was last seen in clinic with Dr. Royann Shivers on 08/27/19. He has a history of noncompliance with his anti-hypertensives, most recently being unable to pick up his medications until his paycheck was received. Since he had not had is medications, his BP was elevated at this visit. His carvedilol was restarted at 6.25 mg BID with instructions to titrate to 25 mg BID within 3 weeks. He was also started on entresto and spironolactone. Plan included getting him a BP cuff at home and rechecking labs after reinstatement of his medications.   I saw him in clinic on 09/23/19 for a virtual visit. At that time, he was unsure what medications he was taking and had not acquired a BP cuff. With the help of staff, we were able to mail him a BP cuff shortly after our visit.  I saw him in follow up on 09/30/19 for another telephone visit. His pressure was elevated to 181/123. He forgot to do a BP log and had not completed labs.  He did not want to add another agent, such as hydralazine. He missed his next appt with me. Unfortunately, he lost insurance and presented to [facility] 08/07/20 after being out of all of his medication for 2 months in hypertensive urgency. Echocardiogram revealed EF 20-25% and grade 3 DD. He underwent repeat heart cath which was negative for  obstructive CAD, but elevated LVEDP and moderate pulmonary hypertension. He was restarted on medications and titrated to include: 25 mg coreg BID 20-37 bidil TID 49-51 mg entresto BID 25 mg spiro daily 40 mg lasix daily    Chronic systolic and diastolic heart failure Nonischemic cardiomyopathy  Hypertensive heart disease -    Malignant hypertension -    CKD stage III -    HLD with goal LDL < 70 - Continue lipitor - no longer on vascepa? 08/06/2020: Cholesterol 149; HDL 27; LDL Cholesterol 107; Triglycerides 74; VLDL 15   Morbid obesity           Past Medical History:  Diagnosis Date  . 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 Dilated aortic root (HCC)    seen on previous echo 02/2016  . Hyperlipidemia LDL goal <130 2015  . Hypertension   . Prediabetes     Past Surgical History:  Procedure Laterality Date  . HERNIA REPAIR    . LEFT HEART CATHETERIZATION WITH CORONARY ANGIOGRAM N/A 10/22/2014   Procedure: LEFT HEART CATHETERIZATION WITH CORONARY ANGIOGRAM;  Surgeon: Peter M Swazil[place], MD; Normal coronary anatomy. Vessels are very large due to hypertensive cardiomyopathy.   Marland Kitchen RIGHT/LEFT HEART CATH AND CORONARY ANGIOGRAPHY N/A 08/10/2020   Procedure: RIGHT/LEFT HEART CATH AND CORONARY ANGIOGRAPHY;  Surgeon: [place]il[place], Peter M, MD;  Location:  [facility] INVASIVE CV LAB;  Service: Cardiovascular;  Laterality: N/A;    Current Medications: No outpatient medications have been marked as taking for the 08/19/20 encounter (Appointment) with Marcelino Duster, PA.     Allergies:   Patient has no known allergies.   Social History   Socioeconomic History  . Marital status: Married    Spouse name: Not on file  . Number of children: Not on file  . Years of education: Not on file  . Highest education level: Not on file  Occupational History  . Occupation: Mining engineer: [employer]    Tobacco Use  . Smoking status: Former Smoker    Quit date: 09/14/2014    Years since quitting: 5.9  . Smokeless tobacco: Never Used  Vaping Use  . Vaping Use: Never used  Substance and Sexual Activity  . Alcohol use: Yes    Comment: Twice a month  . Drug use: Yes    Types: Marijuana  . Sexual activity: Yes  Other Topics Concern  . Not on file  Social History Narrative   Lives with girlfriend.    Social Determinants of Health   Financial Resource Strain:   . Difficulty of Paying Living Expenses: Not on file  Food Insecurity:   . Worried About Programme researcher, broadcasting/film/video in the Last Year: Not on file  . Ran Out of Food in the Last Year: Not on file  Transportation Needs:   . Lack of Transportation (Medical): Not on file  . Lack of Transportation (Non-Medical): Not on file  Physical Activity:   . Days of Exercise per Week: Not on file  . Minutes of Exercise per Session: Not on file  Stress:   . Feeling of Stress : Not on file  Social Connections:   . Frequency of Communication with Friends and Family: Not on file  . Frequency of Social Gatherings with Friends and Family: Not on file  . Attends Religious Services: Not on file  . Active Member of Clubs or Organizations: Not on file  . Attends Banker Meetings: Not on file  . Marital Status: Not on file     Family History: The patient's ***family history includes Diabetes Mellitus II in his maternal grandmother and mother; Hypertension in his brother and mother.  ROS:   Please see the history of present illness.    *** All other systems reviewed and are negative.  EKGs/Labs/Other Studies Reviewed:    The following studies were reviewed today: ***  EKG:  EKG is *** ordered today.  The ekg ordered today demonstrates ***  Recent Labs: 08/21/2019: ALT 16 08/06/2020: B Natriuretic Peptide 485.5 08/07/2020: TSH 1.728 08/12/2020: BUN 23; Creatinine, Ser 1.83; Hemoglobin 14.3; Magnesium 2.3; Platelets 317; Potassium 3.5;  Sodium 138  Recent Lipid Panel    Component Value Date/Time   CHOL 149 08/06/2020 1410   TRIG 74 08/06/2020 1410   HDL 27 (L) 08/06/2020 1410   CHOLHDL 5.5 08/06/2020 1410   VLDL 15 08/06/2020 1410   LDLCALC 107 (H) 08/06/2020 1410   LDLDIRECT 184.0 08/21/2019 0848    Physical Exam:    VS:  There were no vitals taken for this visit.    Wt Readings from Last 3 Encounters:  08/16/20 (!) 321 lb (145.6 kg)  08/12/20 (!) 326 lb 8 oz (148.1 kg)  08/27/19 (!) 316 lb 6.4 oz (143.5 kg)     GEN: *** Well nourished, well developed in no acute distress HEENT:  Normal NECK: No JVD; No carotid bruits LYMPHATICS: No lymphadenopathy CARDIAC: ***RRR, no murmurs, rubs, gallops RESPIRATORY:  Clear to auscultation without rales, wheezing or rhonchi  ABDOMEN: Soft, non-tender, non-distended MUSCULOSKELETAL:  No edema; No deformity  SKIN: Warm and dry NEUROLOGIC:  Alert and oriented x 3 PSYCHIATRIC:  Normal affect   ASSESSMENT:    No diagnosis found. PLAN:    In order of problems listed above:  No diagnosis found.   Medication Adjustments/Labs and Tests Ordered: Current medicines are reviewed at length with the patient today.  Concerns regarding medicines are outlined above.  No orders of the defined types were placed in this encounter.  No orders of the defined types were placed in this encounter.   Signed, Marcelino Duster, PA  08/18/2020 4:16 PM    [facility]

## 2020-08-19 ENCOUNTER — Ambulatory Visit: Payer: Self-pay | Admitting: Physician Assistant

## 2020-08-25 ENCOUNTER — Ambulatory Visit: Payer: Self-pay | Admitting: Physician Assistant

## 2020-09-09 ENCOUNTER — Ambulatory Visit: Payer: Self-pay

## 2020-09-09 ENCOUNTER — Other Ambulatory Visit: Payer: Self-pay | Admitting: Nurse Practitioner

## 2020-09-09 DIAGNOSIS — E785 Hyperlipidemia, unspecified: Secondary | ICD-10-CM

## 2020-09-09 DIAGNOSIS — I429 Cardiomyopathy, unspecified: Secondary | ICD-10-CM

## 2020-09-09 DIAGNOSIS — I5022 Chronic systolic (congestive) heart failure: Secondary | ICD-10-CM

## 2020-09-09 DIAGNOSIS — T502X5A Adverse effect of carbonic-anhydrase inhibitors, benzothiadiazides and other diuretics, initial encounter: Secondary | ICD-10-CM

## 2020-09-09 DIAGNOSIS — I10 Essential (primary) hypertension: Secondary | ICD-10-CM

## 2020-09-09 DIAGNOSIS — E876 Hypokalemia: Secondary | ICD-10-CM

## 2020-09-09 MED ORDER — ATORVASTATIN CALCIUM 40 MG PO TABS: 40.0000 mg | ORAL_TABLET | Freq: Every day | ORAL | 0 refills | Status: DC

## 2020-09-09 MED ORDER — SPIRONOLACTONE 25 MG PO TABS: 25.0000 mg | ORAL_TABLET | Freq: Every day | ORAL | 0 refills | Status: DC

## 2020-09-09 MED ORDER — ISOSORB DINITRATE-HYDRALAZINE 20-37.5 MG PO TABS: 1.0000 | ORAL_TABLET | Freq: Three times a day (TID) | ORAL | 0 refills | Status: DC

## 2020-09-09 MED ORDER — CARVEDILOL 25 MG PO TABS: 25.0000 mg | ORAL_TABLET | Freq: Two times a day (BID) | ORAL | 0 refills | Status: DC

## 2020-09-09 MED ORDER — FUROSEMIDE 40 MG PO TABS: 40.0000 mg | ORAL_TABLET | Freq: Every day | ORAL | 0 refills | Status: DC

## 2020-09-09 MED FILL — SPIRONOLACTONE 25 MG TABLET: 25 | 30 days supply | Qty: 30 | Fill #0

## 2020-09-09 MED FILL — ATORVASTATIN CALCIUM 40 MG: 40 | 30 days supply | Qty: 30 | Fill #0

## 2020-09-09 MED FILL — BIDIL 20-37.5 MG TABS: 20-37.5 | 30 days supply | Qty: 90 | Fill #0

## 2020-09-09 MED FILL — FUROSEMIDE 40 MG TAB: 40 | 30 days supply | Qty: 30 | Fill #0

## 2020-09-09 MED FILL — CARVEDILOL 25 MG TABLET: 25 | 30 days supply | Qty: 60 | Fill #0

## 2020-09-09 NOTE — Telephone Encounter (Signed)
Requested medication (s) are due for refill today - yes  Requested medication (s) are on the active medication list -yes  Future visit scheduled -yes  Last refill: 3 weeks ago  Notes to clinic: Patient has upcoming appointment 09/28/20- will need Rx to carry over until appointment- medication not assigned protocol.  Requested Prescriptions  Pending Prescriptions Disp Refills   sacubitril-valsartan (ENTRESTO) 24-26 MG 60 tablet 0    Sig: Take 1 tablet by mouth 2 (two) times daily.      Off-Protocol Failed - 09/09/2020 10:40 AM      Failed - Medication not assigned to a protocol, review manually.      Passed - Valid encounter within last 12 months    Recent Outpatient Visits           3 weeks ago Encounter to establish care   [facility] [place], Shea Stakes, NP       Future Appointments             In 2 weeks Croitoru, Rachelle Hora, MD [facility] [place], [facility]   In 2 weeks Claiborne Rigg, NP [facility]             Signed Prescriptions Disp Refills   atorvastatin (LIPITOR) 40 MG tablet 30 tablet 0    Sig: Take 1 tablet (40 mg total) by mouth daily.      Cardiovascular:  Antilipid - Statins Failed - 09/09/2020 10:40 AM      Failed - LDL in normal range and within 360 days    LDL Cholesterol  Date Value Ref Range Status  08/06/2020 107 (H) 0 - 99 mg/dL Final    Comment:           Total Cholesterol/HDL:CHD Risk Coronary Heart Disease Risk Table                     Men   Women  1/2 Average Risk   3.4   3.3  Average Risk       5.0   4.4  2 X Average Risk   9.6   7.1  3 X Average Risk  23.4   11.0        Use the calculated Patient Ratio above and the CHD Risk Table to determine the patient's CHD Risk.        ATP III CLASSIFICATION (LDL):  <100     mg/dL   Optimal  161-096  mg/dL   Near or Above                    Optimal  130-159  mg/dL   Borderline  045-409  mg/dL   High  >811     mg/dL   Very  High Performed at [facility], [address] [place], [place] [address]    Direct LDL  Date Value Ref Range Status  08/21/2019 184.0 mg/dL Final    Comment:    Optimal:  <100 mg/dLNear or Above Optimal:  100-129 mg/dLBorderline High:  130-159 mg/dLHigh:  160-189 mg/dLVery High:  >190 mg/dL          Failed - HDL in normal range and within 360 days    HDL  Date Value Ref Range Status  08/06/2020 27 (L) >40 mg/dL Final          Passed - Total Cholesterol in normal range and within 360 days    Cholesterol  Date  Value Ref Range Status  08/06/2020 149 0 - 200 mg/dL Final          Passed - Triglycerides in normal range and within 360 days    Triglycerides  Date Value Ref Range Status  08/06/2020 74 <150 mg/dL Final          Passed - Patient is not pregnant      Passed - Valid encounter within last 12 months    Recent Outpatient Visits           3 weeks ago Encounter to establish care   [facility] [place], Shea Stakes, NP       Future Appointments             In 2 weeks Croitoru, Mihai, MD [facility] [place], [facility]   In 2 weeks Claiborne Rigg, NP [place] [facility]              carvedilol (COREG) 25 MG tablet 60 tablet 0    Sig: Take 1 tablet (25 mg total) by mouth 2 (two) times daily.      Cardiovascular:  Beta Blockers Failed - 09/09/2020 10:40 AM      Failed - Last BP in normal range    BP Readings from Last 1 Encounters:  08/12/20 (!) 135/96          Passed - Last Heart Rate in normal range    Pulse Readings from Last 1 Encounters:  08/12/20 87          Passed - Valid encounter within last 6 months    Recent Outpatient Visits           3 weeks ago Encounter to establish care   [facility] [place], Shea Stakes, NP       Future Appointments             In 2 weeks Croitoru, Mihai, MD [facility] [place], [facility]   In 2 weeks  Claiborne Rigg, NP [place] [facility]              furosemide (LASIX) 40 MG tablet 30 tablet 0    Sig: Take 1 tablet (40 mg total) by mouth daily.      Cardiovascular:  Diuretics - Loop Failed - 09/09/2020 10:40 AM      Failed - Cr in normal range and within 360 days    Creat  Date Value Ref Range Status  10/19/2016 1.32 0.60 - 1.35 mg/dL Final   Creatinine, Ser  Date Value Ref Range Status  08/12/2020 1.83 (H) 0.61 - 1.24 mg/dL Final   Creatinine, Urine  Date Value Ref Range Status  10/21/2014 240.9 mg/dL Final    Comment:    No reference range established. Performed at [facility]           Failed - Last BP in normal range    BP Readings from Last 1 Encounters:  08/12/20 (!) 135/96          Passed - K in normal range and within 360 days    Potassium  Date Value Ref Range Status  08/12/2020 3.5 3.5 - 5.1 mmol/L Final          Passed - Ca in normal range and within 360 days    Calcium  Date Value Ref Range Status  08/12/2020 9.1 8.9 - 10.3 mg/dL Final   Calcium, Ion  Date  Value Ref Range Status  08/10/2020 1.23 1.15 - 1.40 mmol/L Final          Passed - Na in normal range and within 360 days    Sodium  Date Value Ref Range Status  08/12/2020 138 135 - 145 mmol/L Final  10/02/2019 139 134 - 144 mmol/L Final          Passed - Valid encounter within last 6 months    Recent Outpatient Visits           3 weeks ago Encounter to establish care   [facility] [place], Shea Stakes, NP       Future Appointments             In 2 weeks Croitoru, Rachelle Hora, MD [facility] [place], [facility]   In 2 weeks Claiborne Rigg, NP [place] [facility]              isosorbide-hydrALAZINE (BIDIL) 20-37.5 MG tablet 90 tablet 0    Sig: Take 1 tablet by mouth 3 (three) times daily.      Cardiovascular:  Vasodilators Failed - 09/09/2020 10:40 AM      Failed - Last BP in normal  range    BP Readings from Last 1 Encounters:  08/12/20 (!) 135/96          Passed - HCT in normal range and within 360 days    HCT  Date Value Ref Range Status  08/12/2020 44.9 39 - 52 % Final          Passed - HGB in normal range and within 360 days    Hemoglobin  Date Value Ref Range Status  08/12/2020 14.3 13.0 - 17.0 g/dL Final          Passed - RBC in normal range and within 360 days    RBC  Date Value Ref Range Status  08/12/2020 5.05 4.22 - 5.81 MIL/uL Final          Passed - WBC in normal range and within 360 days    WBC  Date Value Ref Range Status  08/12/2020 6.6 4.0 - 10.5 K/uL Final          Passed - PLT in normal range and within 360 days    Platelets  Date Value Ref Range Status  08/12/2020 317 150 - 400 K/uL Final          Passed - Valid encounter within last 12 months    Recent Outpatient Visits           3 weeks ago Encounter to establish care   [facility] [place], Shea Stakes, NP       Future Appointments             In 2 weeks Croitoru, Mihai, MD [facility] [place], [facility]   In 2 weeks Claiborne Rigg, NP [place] [facility]              spironolactone (ALDACTONE) 25 MG tablet 30 tablet 0    Sig: Take 1 tablet (25 mg total) by mouth daily.      Cardiovascular: Diuretics - Aldosterone Antagonist Failed - 09/09/2020 10:40 AM      Failed - Cr in normal range and within 360 days    Creat  Date Value Ref Range Status  10/19/2016 1.32 0.60 - 1.35 mg/dL Final   Creatinine, Ser  Date Value Ref Range  Status  08/12/2020 1.83 (H) 0.61 - 1.24 mg/dL Final   Creatinine, Urine  Date Value Ref Range Status  10/21/2014 240.9 mg/dL Final    Comment:    No reference range established. [facility]           Failed - Last BP in normal range    BP Readings from Last 1 Encounters:  08/12/20 (!) 135/96          Passed - K in normal range and within 360  days    Potassium  Date Value Ref Range Status  08/12/2020 3.5 3.5 - 5.1 mmol/L Final          Passed - Na in normal range and within 360 days    Sodium  Date Value Ref Range Status  08/12/2020 138 135 - 145 mmol/L Final  10/02/2019 139 134 - 144 mmol/L Final          Passed - Valid encounter within last 6 months    Recent Outpatient Visits           3 weeks ago Encounter to establish care   [facility] [place], Shea Stakes[place]elda W, NP       Future Appointments             In 2 weeks Croitoru, Rachelle HoraMihai, MD [facility] [place], [facility]   In 2 weeks Claiborne RiggFleming, Zelda W, NP [place] [facility]                Requested Prescriptions  Pending Prescriptions Disp Refills   sacubitril-valsartan (ENTRESTO) 24-26 MG 60 tablet 0    Sig: Take 1 tablet by mouth 2 (two) times daily.      Off-Protocol Failed - 09/09/2020 10:40 AM      Failed - Medication not assigned to a protocol, review manually.      Passed - Valid encounter within last 12 months    Recent Outpatient Visits           3 weeks ago Encounter to establish care   [facility] [place], Shea StakesZelda W, NP       Future Appointments             In 2 weeks Croitoru, Rachelle HoraMihai, MD [facility] [place], [facility]   In 2 weeks Claiborne RiggFleming, Zelda W, NP [facility]             Signed Prescriptions Disp Refills   atorvastatin (LIPITOR) 40 MG tablet 30 tablet 0    Sig: Take 1 tablet (40 mg total) by mouth daily.      Cardiovascular:  Antilipid - Statins Failed - 09/09/2020 10:40 AM      Failed - LDL in normal range and within 360 days    LDL Cholesterol  Date Value Ref Range Status  08/06/2020 107 (H) 0 - 99 mg/dL Final    Comment:           Total Cholesterol/HDL:CHD Risk Coronary Heart Disease Risk Table                     Men   Women  1/2 Average Risk   3.4   3.3  Average Risk       5.0   4.4  2 X Average Risk    9.6   7.1  3 X Average Risk  23.4   11.0  Use the calculated Patient Ratio above and the CHD Risk Table to determine the patient's CHD Risk.        ATP III CLASSIFICATION (LDL):  <100     mg/dL   Optimal  010-932  mg/dL   Near or Above                    Optimal  130-159  mg/dL   Borderline  355-732  mg/dL   High  >202     mg/dL   Very High Performed at [facility], [address], [place], [place] [address]    Direct LDL  Date Value Ref Range Status  08/21/2019 184.0 mg/dL Final    Comment:    Optimal:  <100 mg/dLNear or Above Optimal:  100-129 mg/dLBorderline High:  130-159 mg/dLHigh:  160-189 mg/dLVery High:  >190 mg/dL          Failed - HDL in normal range and within 360 days    HDL  Date Value Ref Range Status  08/06/2020 27 (L) >40 mg/dL Final          Passed - Total Cholesterol in normal range and within 360 days    Cholesterol  Date Value Ref Range Status  08/06/2020 149 0 - 200 mg/dL Final          Passed - Triglycerides in normal range and within 360 days    Triglycerides  Date Value Ref Range Status  08/06/2020 74 <150 mg/dL Final          Passed - Patient is not pregnant      Passed - Valid encounter within last 12 months    Recent Outpatient Visits           3 weeks ago Encounter to establish care   [facility] [place], Shea Stakes, NP       Future Appointments             In 2 weeks Croitoru, Mihai, MD [facility] [place], [facility]   In 2 weeks Claiborne Rigg, NP [place] [facility]              carvedilol (COREG) 25 MG tablet 60 tablet 0    Sig: Take 1 tablet (25 mg total) by mouth 2 (two) times daily.      Cardiovascular:  Beta Blockers Failed - 09/09/2020 10:40 AM      Failed - Last BP in normal range    BP Readings from Last 1 Encounters:  08/12/20 (!) 135/96          Passed - Last Heart Rate in normal range    Pulse Readings from Last 1  Encounters:  08/12/20 87          Passed - Valid encounter within last 6 months    Recent Outpatient Visits           3 weeks ago Encounter to establish care   [facility] [place], Shea Stakes, NP       Future Appointments             In 2 weeks Croitoru, Rachelle Hora, MD [facility] [place], [facility]   In 2 weeks Claiborne Rigg, NP [place] [facility]              furosemide (LASIX) 40 MG tablet 30 tablet 0    Sig: Take 1 tablet (40 mg total)  by mouth daily.      Cardiovascular:  Diuretics - Loop Failed - 09/09/2020 10:40 AM      Failed - Cr in normal range and within 360 days    Creat  Date Value Ref Range Status  10/19/2016 1.32 0.60 - 1.35 mg/dL Final   Creatinine, Ser  Date Value Ref Range Status  08/12/2020 1.83 (H) 0.61 - 1.24 mg/dL Final   Creatinine, Urine  Date Value Ref Range Status  10/21/2014 240.9 mg/dL Final    Comment:    No reference range established. Performed at [facility]           Failed - Last BP in normal range    BP Readings from Last 1 Encounters:  08/12/20 (!) 135/96          Passed - K in normal range and within 360 days    Potassium  Date Value Ref Range Status  08/12/2020 3.5 3.5 - 5.1 mmol/L Final          Passed - Ca in normal range and within 360 days    Calcium  Date Value Ref Range Status  08/12/2020 9.1 8.9 - 10.3 mg/dL Final   Calcium, Ion  Date Value Ref Range Status  08/10/2020 1.23 1.15 - 1.40 mmol/L Final          Passed - Na in normal range and within 360 days    Sodium  Date Value Ref Range Status  08/12/2020 138 135 - 145 mmol/L Final  10/02/2019 139 134 - 144 mmol/L Final          Passed - Valid encounter within last 6 months    Recent Outpatient Visits           3 weeks ago Encounter to establish care   [facility] [place], Shea Stakes, NP       Future Appointments             In 2 weeks Croitoru,  [place], MD [facility] [place], [facility]   In 2 weeks Claiborne Rigg, NP [place] [facility]              isosorbide-hydrALAZINE (BIDIL) 20-37.5 MG tablet 90 tablet 0    Sig: Take 1 tablet by mouth 3 (three) times daily.      Cardiovascular:  Vasodilators Failed - 09/09/2020 10:40 AM      Failed - Last BP in normal range    BP Readings from Last 1 Encounters:  08/12/20 (!) 135/96          Passed - HCT in normal range and within 360 days    HCT  Date Value Ref Range Status  08/12/2020 44.9 39 - 52 % Final          Passed - HGB in normal range and within 360 days    Hemoglobin  Date Value Ref Range Status  08/12/2020 14.3 13.0 - 17.0 g/dL Final          Passed - RBC in normal range and within 360 days    RBC  Date Value Ref Range Status  08/12/2020 5.05 4.22 - 5.81 MIL/uL Final          Passed - WBC in normal range and within 360 days    WBC  Date Value Ref Range Status  08/12/2020 6.6 4.0 - 10.5 K/uL Final          Passed - PLT in normal  range and within 360 days    Platelets  Date Value Ref Range Status  08/12/2020 317 150 - 400 K/uL Final          Passed - Valid encounter within last 12 months    Recent Outpatient Visits           3 weeks ago Encounter to establish care   [facility] [place], Shea Stakes, NP       Future Appointments             In 2 weeks Croitoru, Mihai, MD [facility] [place], [facility]   In 2 weeks Claiborne Rigg, NP [place] [facility]              spironolactone (ALDACTONE) 25 MG tablet 30 tablet 0    Sig: Take 1 tablet (25 mg total) by mouth daily.      Cardiovascular: Diuretics - Aldosterone Antagonist Failed - 09/09/2020 10:40 AM      Failed - Cr in normal range and within 360 days    Creat  Date Value Ref Range Status  10/19/2016 1.32 0.60 - 1.35 mg/dL Final   Creatinine, Ser  Date Value Ref Range Status  08/12/2020 1.83 (H) 0.61  - 1.24 mg/dL Final   Creatinine, Urine  Date Value Ref Range Status  10/21/2014 240.9 mg/dL Final    Comment:    No reference range established. Performed at [facility]           Failed - Last BP in normal range    BP Readings from Last 1 Encounters:  08/12/20 (!) 135/96          Passed - K in normal range and within 360 days    Potassium  Date Value Ref Range Status  08/12/2020 3.5 3.5 - 5.1 mmol/L Final          Passed - Na in normal range and within 360 days    Sodium  Date Value Ref Range Status  08/12/2020 138 135 - 145 mmol/L Final  10/02/2019 139 134 - 144 mmol/L Final          Passed - Valid encounter within last 6 months    Recent Outpatient Visits           3 weeks ago Encounter to establish care   [facility] [place], Shea Stakes, NP       Future Appointments             In 2 weeks Croitoru, Rachelle Hora, MD [facility] [place], [facility]   In 2 weeks Claiborne Rigg, NP [facility]

## 2020-09-09 NOTE — Telephone Encounter (Signed)
Rx written 9/13- patient has appointment 10/26 for follow up- additional 30 day Rx given per protocol.

## 2020-09-09 NOTE — Telephone Encounter (Signed)
Pt request refill  atorvastatin (LIPITOR) 40 MG tablet carvedilol (COREG) 25 MG tablet furosemide (LASIX) 40 MG tablet isosorbide-hydrALAZINE (BIDIL) 20-37.5 MG tablet sacubitril-valsartan (ENTRESTO) 24-26 MG spironolactone (ALDACTONE) 25 MG tablet  [facility] [place], [place] [facility] [place] [address] Phone:  [phone]  Fax:  [phone]     [name] had to reschedule pt's financial appt that was today. Pt scheduled to come back next week

## 2020-09-10 ENCOUNTER — Other Ambulatory Visit: Payer: Self-pay | Admitting: Family Medicine

## 2020-09-10 MED ORDER — ENTRESTO 24-26 MG PO TABS: 1.0000 | ORAL_TABLET | Freq: Two times a day (BID) | ORAL | 2 refills | Status: DC

## 2020-09-10 MED FILL — **ENTRESTO 24-26 MG TABLET: 24-26 | 30 days supply | Qty: 60 | Fill #0

## 2020-09-14 ENCOUNTER — Other Ambulatory Visit: Payer: Self-pay

## 2020-09-14 ENCOUNTER — Ambulatory Visit: Payer: Self-pay | Attending: Nurse Practitioner

## 2020-09-28 ENCOUNTER — Ambulatory Visit: Payer: Self-pay | Admitting: Nurse Practitioner

## 2020-09-28 ENCOUNTER — Ambulatory Visit: Payer: Self-pay | Admitting: Cardiovascular Disease

## 2020-10-18 MED FILL — CARVEDILOL 25 MG TABLET: 25 | 30 days supply | Qty: 60 | Fill #0

## 2020-10-18 MED FILL — ATORVASTATIN CALCIUM 40 MG: 40 | 30 days supply | Qty: 30 | Fill #0

## 2020-10-18 MED FILL — SPIRONOLACTONE 25 MG TABLET: 25 | 30 days supply | Qty: 30 | Fill #0

## 2020-10-18 MED FILL — FUROSEMIDE 40 MG TAB: 40 | 30 days supply | Qty: 30 | Fill #0

## 2020-10-18 MED FILL — BIDIL 20-37.5 MG TABS: 20-37.5 | 30 days supply | Qty: 90 | Fill #0

## 2020-10-18 MED FILL — ENTRESTO 24 MG-26 MG TABLET: 24-26 | 30 days supply | Qty: 60 | Fill #1

## 2020-11-03 ENCOUNTER — Ambulatory Visit: Payer: Self-pay | Admitting: Nurse Practitioner

## 2020-11-07 NOTE — Progress Notes (Deleted)
Error. No show.

## 2020-11-09 ENCOUNTER — Ambulatory Visit: Payer: Self-pay | Admitting: General Practice

## 2021-03-02 ENCOUNTER — Other Ambulatory Visit: Payer: Self-pay | Admitting: Nurse Practitioner

## 2021-03-02 DIAGNOSIS — I429 Cardiomyopathy, unspecified: Secondary | ICD-10-CM

## 2021-03-02 DIAGNOSIS — I5022 Chronic systolic (congestive) heart failure: Secondary | ICD-10-CM

## 2021-03-02 DIAGNOSIS — T502X5A Adverse effect of carbonic-anhydrase inhibitors, benzothiadiazides and other diuretics, initial encounter: Secondary | ICD-10-CM

## 2021-03-02 DIAGNOSIS — I10 Essential (primary) hypertension: Secondary | ICD-10-CM

## 2021-03-02 DIAGNOSIS — E785 Hyperlipidemia, unspecified: Secondary | ICD-10-CM

## 2021-03-02 DIAGNOSIS — E876 Hypokalemia: Secondary | ICD-10-CM

## 2021-03-02 NOTE — Telephone Encounter (Signed)
Requested medication (s) are due for refill today: no  Requested medication (s) are on the active medication list: yes    Future visit scheduled:no  Notes to clinic: Patient is overdue for follow up appointment  Vm left for patient to contact office    Requested Prescriptions  Pending Prescriptions Disp Refills   spironolactone (ALDACTONE) 25 MG tablet [Pharmacy Med Name: SPIRONOLACTONE 25 MG TABLET 25 Tablet] 30 tablet 0    Sig: Take 1 tablet (25 mg total) by mouth daily.      Cardiovascular: Diuretics - Aldosterone Antagonist Failed - 03/02/2021  9:38 AM      Failed - Cr in normal range and within 360 days    Creat  Date Value Ref Range Status  10/19/2016 1.32 0.60 - 1.35 mg/dL Final   Creatinine, Ser  Date Value Ref Range Status  08/12/2020 1.83 (H) 0.61 - 1.24 mg/dL Final   Creatinine, Urine  Date Value Ref Range Status  10/21/2014 240.9 mg/dL Final    Comment:    No reference range established. Performed at [facility]           Failed - Last BP in normal range    BP Readings from Last 1 Encounters:  08/12/20 (!) 135/96          Failed - Valid encounter within last 6 months    Recent Outpatient Visits           6 months ago Encounter to establish care   [facility] [place], [place] W, NP                Passed - K in normal range and within 360 days    Potassium  Date Value Ref Range Status  08/12/2020 3.5 3.5 - 5.1 mmol/L Final          Passed - Na in normal range and within 360 days    Sodium  Date Value Ref Range Status  08/12/2020 138 135 - 145 mmol/L Final  10/02/2019 139 134 - 144 mmol/L Final            BIDIL 20-37.5 MG tablet [Pharmacy Med Name: BIDIL 20-37.5 MG TABS 20-37.5 Tablet] 90 tablet 0    Sig: TAKE 1 TABLET BY MOUTH 3 (THREE) TIMES DAILY.      Cardiovascular:  Vasodilators Failed - 03/02/2021  9:38 AM      Failed - Last BP in normal range    BP Readings from Last 1 Encounters:   08/12/20 (!) 135/96          Passed - HCT in normal range and within 360 days    HCT  Date Value Ref Range Status  08/12/2020 44.9 39.0 - 52.0 % Final          Passed - HGB in normal range and within 360 days    Hemoglobin  Date Value Ref Range Status  08/12/2020 14.3 13.0 - 17.0 g/dL Final          Passed - RBC in normal range and within 360 days    RBC  Date Value Ref Range Status  08/12/2020 5.05 4.22 - 5.81 MIL/uL Final          Passed - WBC in normal range and within 360 days    WBC  Date Value Ref Range Status  08/12/2020 6.6 4.0 - 10.5 K/uL Final          Passed - PLT in normal range and  within 360 days    Platelets  Date Value Ref Range Status  08/12/2020 317 150 - 400 K/uL Final          Passed - Valid encounter within last 12 months    Recent Outpatient Visits           6 months ago Encounter to establish care   [facility] [place], [place] W, NP                  furosemide (LASIX) 40 MG tablet [Pharmacy Med Name: FUROSEMIDE 40 MG TAB 40 Tablet] 30 tablet 0    Sig: Take 1 tablet (40 mg total) by mouth daily.      Cardiovascular:  Diuretics - Loop Failed - 03/02/2021  9:38 AM      Failed - Cr in normal range and within 360 days    Creat  Date Value Ref Range Status  10/19/2016 1.32 0.60 - 1.35 mg/dL Final   Creatinine, Ser  Date Value Ref Range Status  08/12/2020 1.83 (H) 0.61 - 1.24 mg/dL Final   Creatinine, Urine  Date Value Ref Range Status  10/21/2014 240.9 mg/dL Final    Comment:    No reference range established. Performed at [facility]           Failed - Last BP in normal range    BP Readings from Last 1 Encounters:  08/12/20 (!) 135/96          Failed - Valid encounter within last 6 months    Recent Outpatient Visits           6 months ago Encounter to establish care   [facility] [place], [place] W, NP                Passed - K in  normal range and within 360 days    Potassium  Date Value Ref Range Status  08/12/2020 3.5 3.5 - 5.1 mmol/L Final          Passed - Ca in normal range and within 360 days    Calcium  Date Value Ref Range Status  08/12/2020 9.1 8.9 - 10.3 mg/dL Final   Calcium, Ion  Date Value Ref Range Status  08/10/2020 1.23 1.15 - 1.40 mmol/L Final          Passed - Na in normal range and within 360 days    Sodium  Date Value Ref Range Status  08/12/2020 138 135 - 145 mmol/L Final  10/02/2019 139 134 - 144 mmol/L Final            atorvastatin (LIPITOR) 40 MG tablet [Pharmacy Med Name: ATORVASTATIN CALCIUM 40 MG 40 Tablet] 30 tablet 0    Sig: Take 1 tablet (40 mg total) by mouth daily.      Cardiovascular:  Antilipid - Statins Failed - 03/02/2021  9:38 AM      Failed - LDL in normal range and within 360 days    LDL Cholesterol  Date Value Ref Range Status  08/06/2020 107 (H) 0 - 99 mg/dL Final    Comment:           Total Cholesterol/HDL:CHD Risk Coronary Heart Disease Risk Table                     Men   Women  1/2 Average Risk   3.4   3.3  Average Risk  5.0   4.4  2 X Average Risk   9.6   7.1  3 X Average Risk  23.4   11.0        Use the calculated Patient Ratio above and the CHD Risk Table to determine the patient's CHD Risk.        ATP III CLASSIFICATION (LDL):  <100     mg/dL   Optimal  222-979  mg/dL   Near or Above                    Optimal  130-159  mg/dL   Borderline  892-119  mg/dL   High  >417     mg/dL   Very High Performed at [facility], [address], [place], [place] [address]    Direct LDL  Date Value Ref Range Status  08/21/2019 184.0 mg/dL Final    Comment:    Optimal:  <100 mg/dLNear or Above Optimal:  100-129 mg/dLBorderline High:  130-159 mg/dLHigh:  160-189 mg/dLVery High:  >190 mg/dL          Failed - HDL in normal range and within 360 days    HDL  Date Value Ref Range Status  08/06/2020 27 (L) >40 mg/dL Final           Passed - Total Cholesterol in normal range and within 360 days    Cholesterol  Date Value Ref Range Status  08/06/2020 149 0 - 200 mg/dL Final          Passed - Triglycerides in normal range and within 360 days    Triglycerides  Date Value Ref Range Status  08/06/2020 74 <150 mg/dL Final          Passed - Patient is not pregnant      Passed - Valid encounter within last 12 months    Recent Outpatient Visits           6 months ago Encounter to establish care   [facility] [place], Shea Stakes, NP                  carvedilol (COREG) 25 MG tablet [Pharmacy Med Name: CARVEDILOL 25 MG TABLET 25 Tablet] 60 tablet 0    Sig: Take 1 tablet (25 mg total) by mouth 2 (two) times daily.      Cardiovascular:  Beta Blockers Failed - 03/02/2021  9:38 AM      Failed - Last BP in normal range    BP Readings from Last 1 Encounters:  08/12/20 (!) 135/96          Failed - Valid encounter within last 6 months    Recent Outpatient Visits           6 months ago Encounter to establish care   [facility] [place], [place] W, NP                Passed - Last Heart Rate in normal range    Pulse Readings from Last 1 Encounters:  08/12/20 87

## 2021-03-03 ENCOUNTER — Other Ambulatory Visit: Payer: Self-pay | Admitting: Family Medicine

## 2021-03-03 ENCOUNTER — Ambulatory Visit: Payer: Self-pay | Attending: Family Medicine | Admitting: Family Medicine

## 2021-03-03 ENCOUNTER — Other Ambulatory Visit: Payer: Self-pay

## 2021-03-03 DIAGNOSIS — E785 Hyperlipidemia, unspecified: Secondary | ICD-10-CM

## 2021-03-03 DIAGNOSIS — I5022 Chronic systolic (congestive) heart failure: Secondary | ICD-10-CM

## 2021-03-03 DIAGNOSIS — I10 Essential (primary) hypertension: Secondary | ICD-10-CM

## 2021-03-03 DIAGNOSIS — E876 Hypokalemia: Secondary | ICD-10-CM

## 2021-03-03 DIAGNOSIS — T502X5A Adverse effect of carbonic-anhydrase inhibitors, benzothiadiazides and other diuretics, initial encounter: Secondary | ICD-10-CM

## 2021-03-03 MED ORDER — ENTRESTO 24-26 MG PO TABS: 1.0000 | ORAL_TABLET | Freq: Two times a day (BID) | ORAL | 2 refills | Status: DC

## 2021-03-03 MED ORDER — SPIRONOLACTONE 25 MG PO TABS: 25.0000 mg | ORAL_TABLET | Freq: Every day | ORAL | 0 refills | Status: DC

## 2021-03-03 MED ORDER — DAPAGLIFLOZIN PROPANEDIOL 5 MG PO TABS: 5.0000 mg | ORAL_TABLET | Freq: Every day | ORAL | 3 refills | Status: DC

## 2021-03-03 MED ORDER — CLONIDINE HCL 0.2 MG PO TABS
0.2000 mg | ORAL_TABLET | Freq: Once | ORAL | Status: AC
  Administered 2021-03-03: 0.2 mg via ORAL

## 2021-03-03 MED ORDER — ISOSORB DINITRATE-HYDRALAZINE 20-37.5 MG PO TABS: 1.0000 | ORAL_TABLET | Freq: Three times a day (TID) | ORAL | 0 refills | Status: DC

## 2021-03-03 MED ORDER — FUROSEMIDE 40 MG PO TABS: 40.0000 mg | ORAL_TABLET | Freq: Every day | ORAL | 0 refills | Status: DC

## 2021-03-03 MED ORDER — ATORVASTATIN CALCIUM 40 MG PO TABS: 40.0000 mg | ORAL_TABLET | Freq: Every day | ORAL | 0 refills | Status: DC

## 2021-03-03 MED ORDER — CARVEDILOL 25 MG PO TABS: 25.0000 mg | ORAL_TABLET | Freq: Two times a day (BID) | ORAL | 0 refills | Status: DC

## 2021-03-03 MED FILL — CARVEDILOL 25 MG TABLET: 25 | 30 days supply | Qty: 60 | Fill #0

## 2021-03-03 MED FILL — ATORVASTATIN CALCIUM 40 MG: 40 | 30 days supply | Qty: 30 | Fill #0

## 2021-03-03 MED FILL — FUROSEMIDE 40 MG TAB: 40 | 30 days supply | Qty: 30 | Fill #0

## 2021-03-03 MED FILL — FARXIGA 5 MG TABLET: 5 | 30 days supply | Qty: 30 | Fill #0

## 2021-03-03 MED FILL — SPIRONOLACTONE 25 MG TABLET: 25 | 30 days supply | Qty: 30 | Fill #0

## 2021-03-03 MED FILL — BIDIL 20-37.5 MG TABS: 20-37.5 | 30 days supply | Qty: 90 | Fill #0

## 2021-03-03 NOTE — Progress Notes (Signed)
Subjective:  Patient ID: Steve Carrillo, male    DOB: [birth year]  Age: 40 y.o. MRN: [id number]  CC: Hypertension   HPI Steve Carrillo is a 40 year old male patient of [facility] with a history of heart failure with reduced EF (EF of 20 to 25% from echo of 08/07/2020), hypertension, morbid obesity, noncompliance for follow-up visit. Last visit with cardiology was in 09/2019. Last visit with PCP was in 08/2020.  He has been out of medications for a week and his blood pressure in the clinic is 181/144.  Denies presence of headache, blurry vision, nausea, vomiting, lightheadedness, palpitations. He had dyspnea  couple of nights ago but has no chest pain or pedal edema.  He has a good exercise tolerance and denies presence of paroxysmal nocturnal dyspnea or orthopnea.  His weight is down 17 lbs in the last 6 months.  He has joined a gym and has been very active. He has no additional concerns at the moment. Past Medical History:  Diagnosis Date  . Cardiomyopathy due to hypertension ([place]) 10/2014   NICM EF 35-40%, global LV strain is abnormal at -10.9%  . Dilated aortic root ([place])    seen on previous echo 02/2016  . Hyperlipidemia LDL goal <130 2015  . Hypertension   . Prediabetes     Past Surgical History:  Procedure Laterality Date  . HERNIA REPAIR    . LEFT HEART CATHETERIZATION WITH CORONARY ANGIOGRAM N/A 10/22/2014   Procedure: LEFT HEART CATHETERIZATION WITH CORONARY ANGIOGRAM;  Surgeon: Peter M Martinique, MD; Normal coronary anatomy. Vessels are very large due to hypertensive cardiomyopathy.   [facility] Kitchen RIGHT/LEFT HEART CATH AND CORONARY ANGIOGRAPHY N/A 08/10/2020   Procedure: RIGHT/LEFT HEART CATH AND CORONARY ANGIOGRAPHY;  Surgeon: Martinique, Peter M, MD;  Location: [facility];  Service: Cardiovascular;  Laterality: N/A;    Family History  Problem Relation Age of Onset  . Diabetes Mellitus II Mother   . Hypertension Mother   . Hypertension Brother   . Diabetes Mellitus II Maternal  Grandmother     No Known Allergies  Outpatient Medications Prior to Visit  Medication Sig Dispense Refill  . atorvastatin (LIPITOR) 40 MG tablet Take 1 tablet (40 mg total) by mouth daily. 30 tablet 0  . carvedilol (COREG) 25 MG tablet Take 1 tablet (25 mg total) by mouth 2 (two) times daily. 60 tablet 0  . furosemide (LASIX) 40 MG tablet Take 1 tablet (40 mg total) by mouth daily. 30 tablet 0  . isosorbide-hydrALAZINE (BIDIL) 20-37.5 MG tablet Take 1 tablet by mouth 3 (three) times daily. 90 tablet 0  . sacubitril-valsartan (ENTRESTO) 24-26 MG Take 1 tablet by mouth 2 (two) times daily. 60 tablet 2  . spironolactone (ALDACTONE) 25 MG tablet Take 1 tablet (25 mg total) by mouth daily. 30 tablet 0   No facility-administered medications prior to visit.     ROS Review of Systems  Constitutional: Negative for activity change and appetite change.  HENT: Negative for sinus pressure and sore throat.   Eyes: Negative for visual disturbance.  Respiratory: Negative for cough, chest tightness and shortness of breath.   Cardiovascular: Negative for chest pain and leg swelling.  Gastrointestinal: Negative for abdominal distention, abdominal pain, constipation and diarrhea.  Endocrine: Negative.   Genitourinary: Negative for dysuria.  Musculoskeletal: Negative for joint swelling and myalgias.  Skin: Negative for rash.  Allergic/Immunologic: Negative.   Neurological: Negative for weakness, light-headedness and numbness.  Psychiatric/Behavioral: Negative for dysphoric mood and suicidal ideas.  Objective:  BP (!) 181/144 (BP Location: Right Arm, Patient Position: Sitting)   Pulse 96   Ht $R'6\' 1"'es$  (1.854 m)   Wt (!) 304 lb (137.9 kg)   SpO2 99%   BMI 40.11 kg/m   BP/Weight 03/03/2021 2/56/3893 06/05/4286  Systolic BP 681 - 157  Diastolic BP 262 - 96  Wt. (Lbs) 304 321 326.5  BMI 40.11 42.35 43.08      Physical Exam Constitutional:      Appearance: He is well-developed.  Neck:      Vascular: JVD (slightly elevated JVD) present.  Cardiovascular:     Rate and Rhythm: Normal rate.     Heart sounds: Normal heart sounds. No murmur heard.   Pulmonary:     Effort: Pulmonary effort is normal.     Breath sounds: Normal breath sounds. No wheezing or rales.  Chest:     Chest wall: No tenderness.  Abdominal:     General: Bowel sounds are normal. There is no distension.     Palpations: Abdomen is soft. There is no mass.     Tenderness: There is no abdominal tenderness.  Musculoskeletal:        General: Normal range of motion.     Right lower leg: No edema.     Left lower leg: No edema.  Neurological:     Mental Status: He is alert and oriented to person, place, and time.  Psychiatric:        Mood and Affect: Mood normal.     CMP Latest Ref Rng & Units 08/12/2020 08/11/2020 08/10/2020  Glucose 70 - 99 mg/dL 119(H) 118(H) -  BUN 6 - 20 mg/dL 23(H) 20 -  Creatinine 0.61 - 1.24 mg/dL 1.83(H) 1.49(H) -  Sodium 135 - 145 mmol/L 138 140 139  Potassium 3.5 - 5.1 mmol/L 3.5 3.6 3.7  Chloride 98 - 111 mmol/L 106 107 -  CO2 22 - 32 mmol/L 24 24 -  Calcium 8.9 - 10.3 mg/dL 9.1 8.9 -  Total Protein 6.0 - 8.3 g/dL - - -  Total Bilirubin 0.2 - 1.2 mg/dL - - -  Alkaline Phos 39 - 117 U/L - - -  AST 0 - 37 U/L - - -  ALT 0 - 53 U/L - - -    Lipid Panel     Component Value Date/Time   CHOL 149 08/06/2020 1410   TRIG 74 08/06/2020 1410   HDL 27 (L) 08/06/2020 1410   CHOLHDL 5.5 08/06/2020 1410   VLDL 15 08/06/2020 1410   LDLCALC 107 (H) 08/06/2020 1410   LDLDIRECT 184.0 08/21/2019 0848    CBC    Component Value Date/Time   WBC 6.6 08/12/2020 0548   RBC 5.05 08/12/2020 0548   HGB 14.3 08/12/2020 0548   HCT 44.9 08/12/2020 0548   PLT 317 08/12/2020 0548   MCV 88.9 08/12/2020 0548   MCH 28.3 08/12/2020 0548   MCHC 31.8 08/12/2020 0548   RDW 13.9 08/12/2020 0548   LYMPHSABS 2.2 08/12/2020 0548   MONOABS 0.6 08/12/2020 0548   EOSABS 0.2 08/12/2020 0548   BASOSABS 0.1  08/12/2020 0548    Lab Results  Component Value Date   HGBA1C 5.7 (H) 08/07/2020    Assessment & Plan:  1. Hyperlipidemia LDL goal <130 Last LDL was above goal He is due for lipid panel but is not fasting today We will refill Lipitor as he has been out Low-cholesterol diet Lipid panel at next visit - atorvastatin (LIPITOR) 40 MG  tablet; Take 1 tablet (40 mg total) by mouth daily.  Dispense: 30 tablet; Refill: 0  2. Chronic systolic congestive heart failure (HCC) EF of 20 to 25%, NYHA II I have refilled his medications as he has been out of his medications Advised to keep appointment with cardiology He is due for reevaluation of his cardiac function - carvedilol (COREG) 25 MG tablet; Take 1 tablet (25 mg total) by mouth 2 (two) times daily.  Dispense: 60 tablet; Refill: 0 - furosemide (LASIX) 40 MG tablet; Take 1 tablet (40 mg total) by mouth daily.  Dispense: 30 tablet; Refill: 0 - isosorbide-hydrALAZINE (BIDIL) 20-37.5 MG tablet; Take 1 tablet by mouth 3 (three) times daily.  Dispense: 90 tablet; Refill: 0 - sacubitril-valsartan (ENTRESTO) 24-26 MG; Take 1 tablet by mouth 2 (two) times daily.  Dispense: 60 tablet; Refill: 2 - spironolactone (ALDACTONE) 25 MG tablet; Take 1 tablet (25 mg total) by mouth daily.  Dispense: 30 tablet; Refill: 0 - CMP14+EGFR - Brain natriuretic peptide - dapagliflozin propanediol (FARXIGA) 5 MG TABS tablet; Take 1 tablet (5 mg total) by mouth daily before breakfast.  Dispense: 30 tablet; Refill: 3  3. Essential hypertension, malignant Severely elevated due to running out of medications which puts him at risk of endorgan damage He has no current symptoms suggestive of target organ affectation Clonidine 0.2 administered statin patient observed for 30 minutes after which blood pressure repeated Compliance has been emphasized Counseled on blood pressure goal of less than 130/80, low-sodium, DASH diet, medication compliance, 150 minutes of moderate  intensity exercise per week. Discussed medication compliance, adverse effects. - carvedilol (COREG) 25 MG tablet; Take 1 tablet (25 mg total) by mouth 2 (two) times daily.  Dispense: 60 tablet; Refill: 0 - isosorbide-hydrALAZINE (BIDIL) 20-37.5 MG tablet; Take 1 tablet by mouth 3 (three) times daily.  Dispense: 90 tablet; Refill: 0 - spironolactone (ALDACTONE) 25 MG tablet; Take 1 tablet (25 mg total) by mouth daily.  Dispense: 30 tablet; Refill: 0 - cloNIDine (CATAPRES) tablet 0.2 mg  4. Diuretic-induced hypokalemia - spironolactone (ALDACTONE) 25 MG tablet; Take 1 tablet (25 mg total) by mouth daily.  Dispense: 30 tablet; Refill: 0    No orders of the defined types were placed in this encounter.   Return in about 3 months (around 06/02/2021) for medical conditions with PCP - [facility].       Charlott Rakes, MD, FAAFP. [facility] [place], [place]   03/03/2021, 9:06 AM

## 2021-03-04 LAB — CMP14+EGFR
ALT: 21 IU/L (ref 0–44)
AST: 18 IU/L (ref 0–40)
Albumin/Globulin Ratio: 1.6 (ref 1.2–2.2)
Albumin: 4.1 g/dL (ref 4.0–5.0)
Alkaline Phosphatase: 81 IU/L (ref 44–121)
BUN/Creatinine Ratio: 10 (ref 9–20)
BUN: 17 mg/dL (ref 6–20)
Bilirubin Total: 0.6 mg/dL (ref 0.0–1.2)
CO2: 21 mmol/L (ref 20–29)
Calcium: 9.3 mg/dL (ref 8.7–10.2)
Chloride: 105 mmol/L (ref 96–106)
Creatinine, Ser: 1.67 mg/dL — ABNORMAL HIGH (ref 0.76–1.27)
Globulin, Total: 2.6 g/dL (ref 1.5–4.5)
Glucose: 98 mg/dL (ref 65–99)
Potassium: 4.1 mmol/L (ref 3.5–5.2)
Sodium: 143 mmol/L (ref 134–144)
Total Protein: 6.7 g/dL (ref 6.0–8.5)
eGFR: 53 mL/min/{1.73_m2} — ABNORMAL LOW (ref >59)

## 2021-03-04 LAB — BRAIN NATRIURETIC PEPTIDE: BNP: 324.1 pg/mL — ABNORMAL HIGH (ref 0.0–100.0)

## 2021-03-05 ENCOUNTER — Other Ambulatory Visit: Payer: Self-pay

## 2021-03-11 ENCOUNTER — Telehealth: Payer: Self-pay

## 2021-03-11 NOTE — Telephone Encounter (Signed)
Patient name and DOB has been verified Patient was informed of lab results. Patient had no questions.  

## 2021-03-11 NOTE — Telephone Encounter (Signed)
-----   Message from Hoy Register, MD sent at 03/07/2021 10:35 AM EDT ----- Kidney function is abnormal but improved compared to last labs.BNP which is a marker of CHF is slightly elevated likely due to running out of Lasix which I have refilled. Please advise to avoid NSAIDS

## 2021-04-09 IMAGING — CT CT ANGIO CHEST
2 of 6 series · 18 of 36 positions shown · IV contrast (omnipaque)
Comparison: Chest radiograph 08/06/2020

CLINICAL DATA: Elevated D-dimer. Chest pain and shortness of breath

EXAM:
CT ANGIOGRAPHY CHEST WITH CONTRAST
TECHNIQUE: Multidetector CT imaging of the chest was performed using the
standard protocol during bolus administration of intravenous
contrast. Multiplanar CT image reconstructions and MIPs were
obtained to evaluate the vascular anatomy.
CONTRAST:  100mL OMNIPAQUE IOHEXOL 350 MG/ML SOLN

[Series 5: thins · axial · 0.77mm/px · z∈[-85,+235]mm · 17 of 360 slices shown]
[im 20/360  lung]
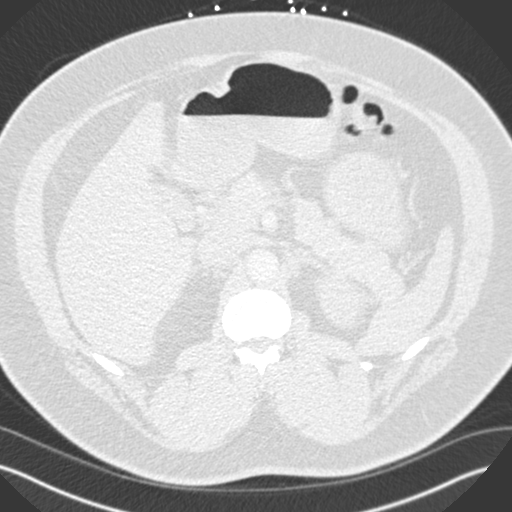
[im 40/360  mediastinal]
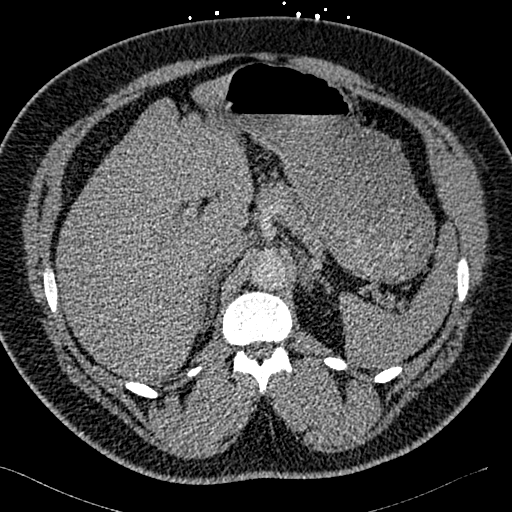
[im 60/360  lung]
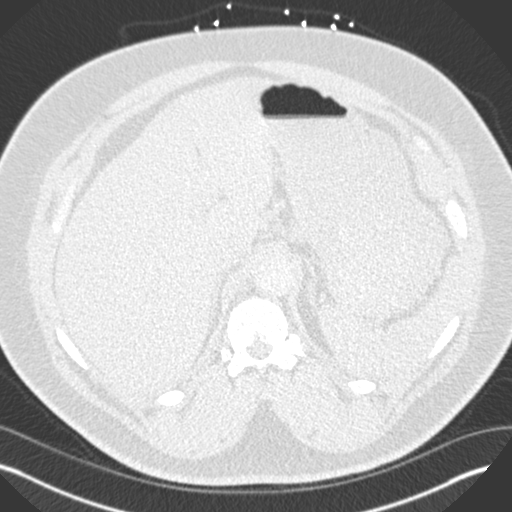
[im 80/360  mediastinal]
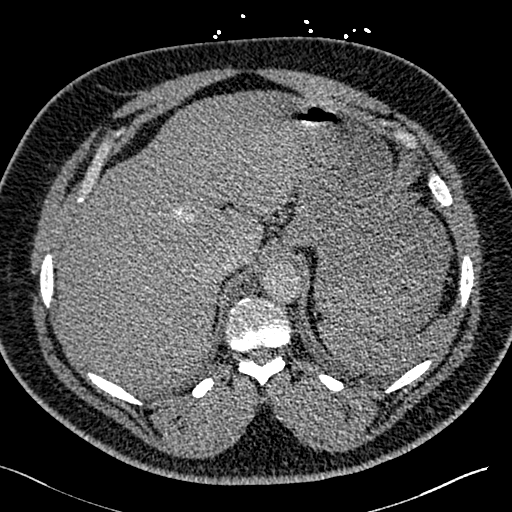
[im 100/360  lung]
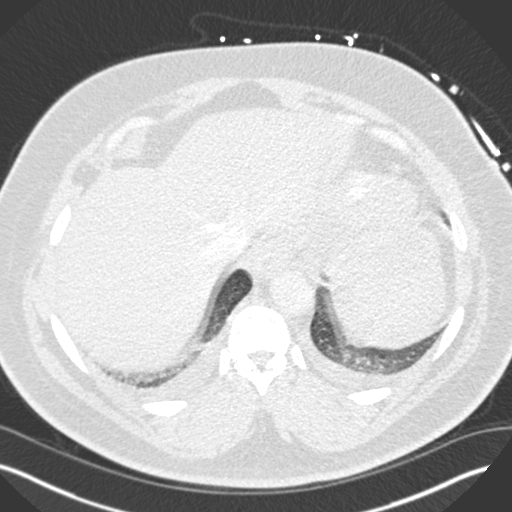
[im 120/360  mediastinal]
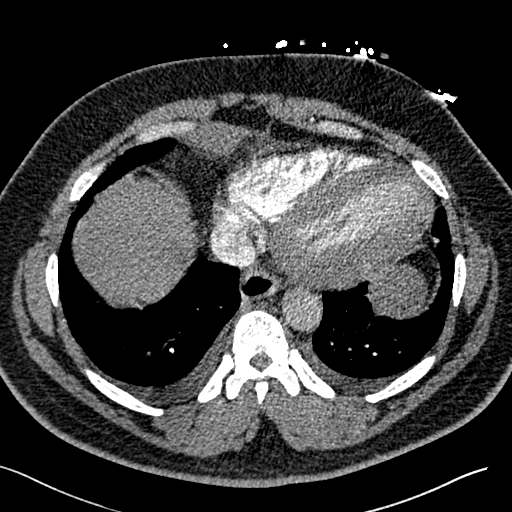
[im 140/360  lung]
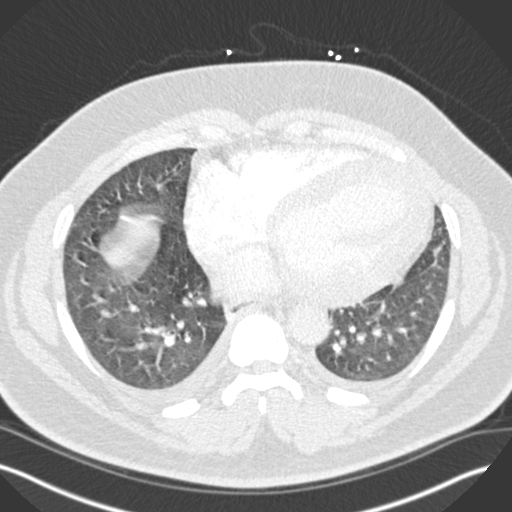
[im 160/360  mediastinal]
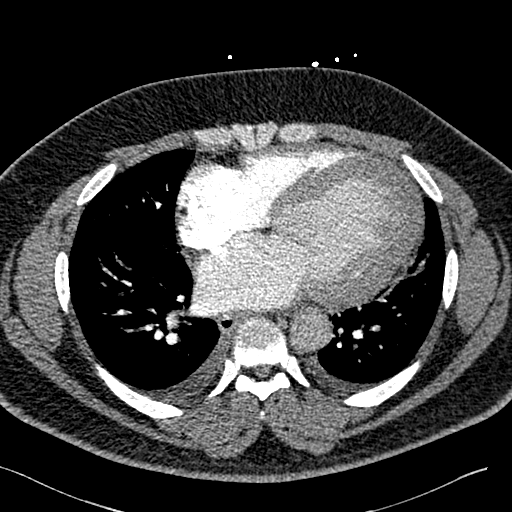
[im 180/360  lung]
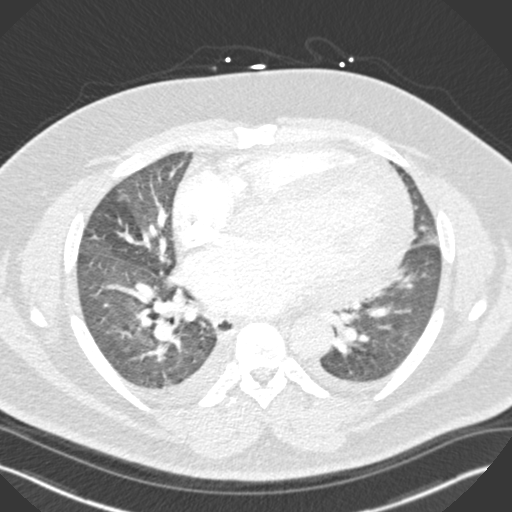
[im 200/360  mediastinal]
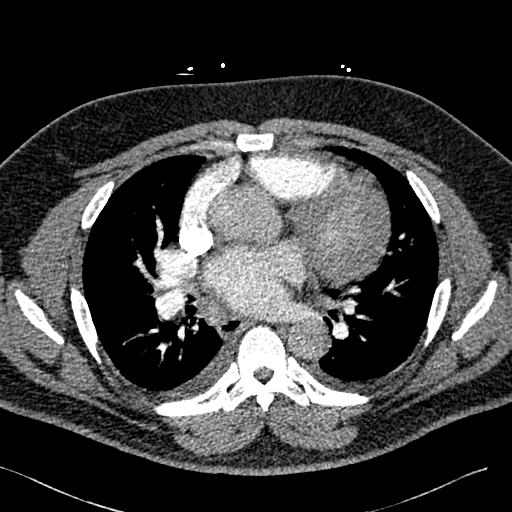
[im 220/360  lung]
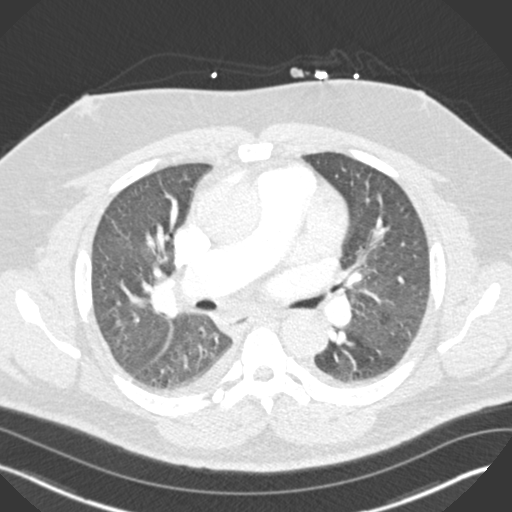
[im 240/360  mediastinal]
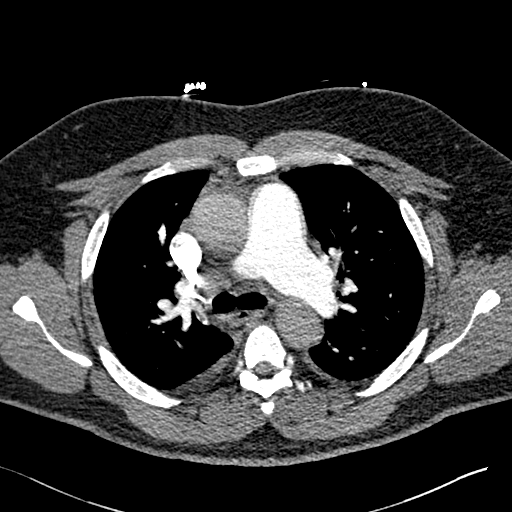
[im 260/360  lung]
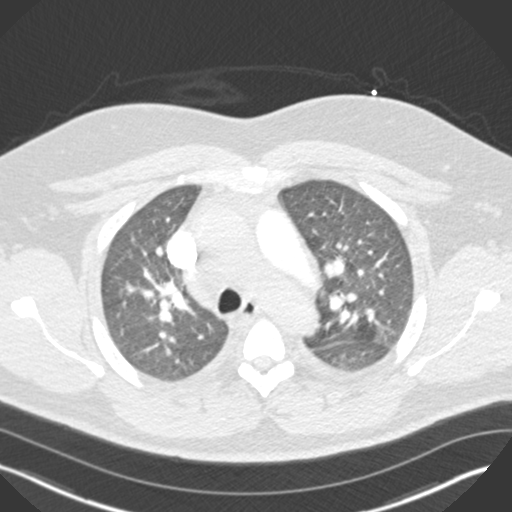
[im 280/360  mediastinal]
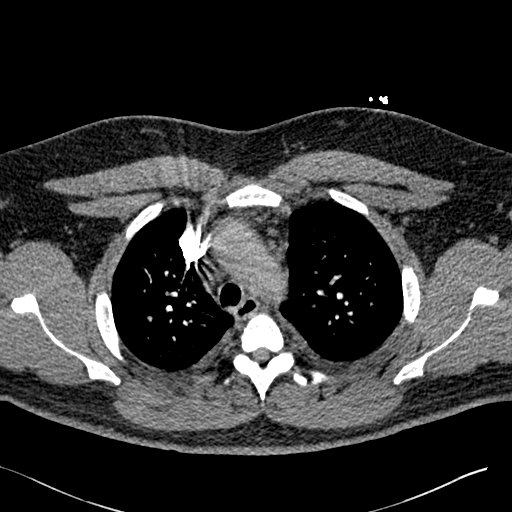
[im 300/360  lung]
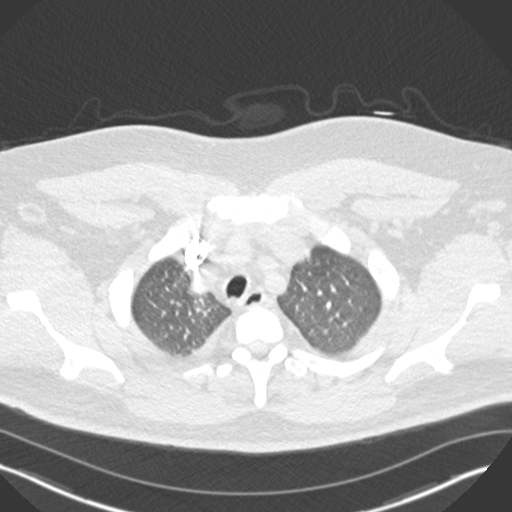
[im 320/360  mediastinal]
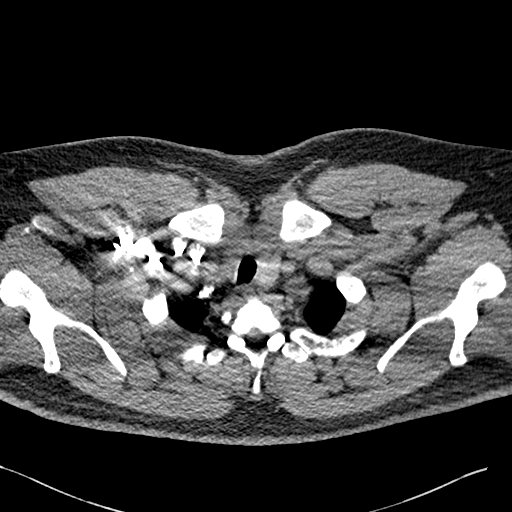
[im 340/360  lung]
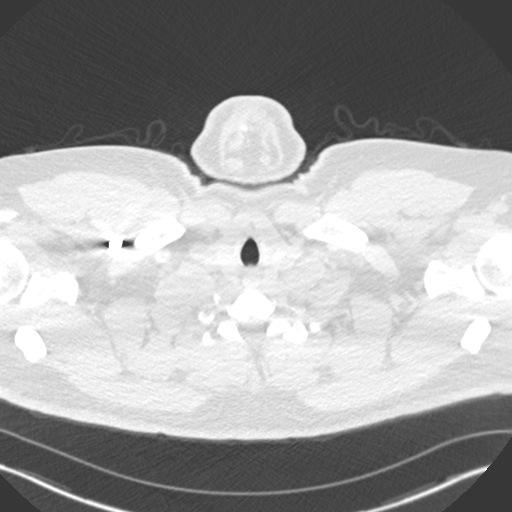

[Series 7: coronal mpr · coronal · 0.72mm/px · 1 of 165 slices shown]
[im 83/165  mediastinal]
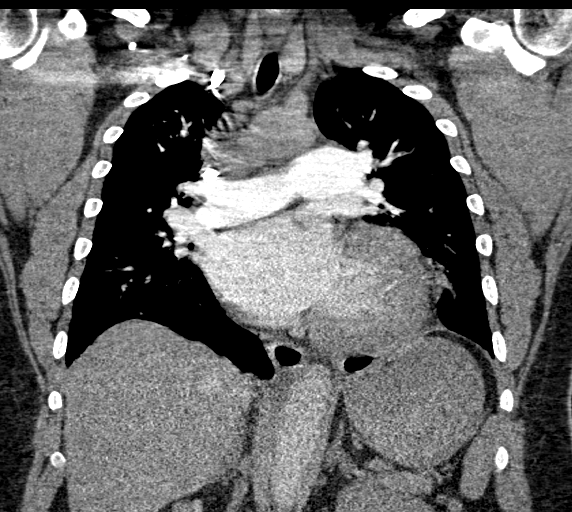

[18 of 36 positions shown; findings below may reference images not displayed]

FINDINGS: Cardiovascular: No filling defect is identified in the pulmonary
arterial tree to suggest pulmonary embolus. Moderate cardiomegaly
especially involving the left heart.

Mediastinum/Nodes: Prevascular node 1.0 cm in short axis on image
45/4. Suspected additional mildly enlarged paratracheal and
subcarinal lymph nodes, although indistinctly marginated.

Lungs/Pleura: Small bilateral pleural effusions, nonspecific for
transudative or exudative etiology. Subsegmental atelectasis or
scarring anteriorly in the left lower lobe. Prominence of vessels
favoring pulmonary venous hypertension

Ground-glass density posteriorly in the right upper lobe measuring
1.5 by 0.9 cm on image 65/6. Sub solid right lower lobe nodule
cm in diameter on image 89/6. Subtle interstitial accentuation.

Upper Abdomen: Unremarkable

Musculoskeletal: Unremarkable

Review of the MIP images confirms the above findings.
IMPRESSION: 1. No filling defect is identified in the pulmonary arterial tree to
suggest pulmonary embolus.
2. Moderate cardiomegaly especially involving the left heart.
Pulmonary venous hypertension and faint interstitial edema.
3. Small bilateral pleural effusions, nonspecific for transudative
or exudative etiology.
4. Ground-glass density posteriorly in the right upper lobe, 1.5 by
0.9 cm in diameter, probably from alveolitis. Separate 0.5 cm sub
solid nodule in the right lower lobe. These may represent areas of
slightly more confluent edema are nonspecific. Based on current
guidelines, CT chest without contrast is recommended in 6 months in
order to assess for persistence.
5. Subsegmental atelectasis or scarring anteriorly in the left lower
lobe.

## 2021-04-20 ENCOUNTER — Other Ambulatory Visit: Payer: Self-pay

## 2021-04-20 ENCOUNTER — Other Ambulatory Visit: Payer: Self-pay | Admitting: Family Medicine

## 2021-04-20 DIAGNOSIS — I5022 Chronic systolic (congestive) heart failure: Secondary | ICD-10-CM

## 2021-04-20 DIAGNOSIS — E785 Hyperlipidemia, unspecified: Secondary | ICD-10-CM

## 2021-04-20 DIAGNOSIS — E876 Hypokalemia: Secondary | ICD-10-CM

## 2021-04-20 DIAGNOSIS — I10 Essential (primary) hypertension: Secondary | ICD-10-CM

## 2021-04-20 MED ORDER — SPIRONOLACTONE 25 MG PO TABS
ORAL_TABLET | Freq: Every day | ORAL | 0 refills | Status: DC
  Filled 2021-04-20: qty 30, 30d supply, fill #0

## 2021-04-20 MED ORDER — ATORVASTATIN CALCIUM 40 MG PO TABS
ORAL_TABLET | Freq: Every day | ORAL | 0 refills | Status: DC
  Filled 2021-04-20: qty 30, 30d supply, fill #0

## 2021-04-20 MED ORDER — CARVEDILOL 25 MG PO TABS
ORAL_TABLET | Freq: Two times a day (BID) | ORAL | 0 refills | Status: DC
  Filled 2021-04-20: qty 60, 30d supply, fill #0

## 2021-04-20 MED ORDER — FUROSEMIDE 40 MG PO TABS
ORAL_TABLET | Freq: Every day | ORAL | 0 refills | Status: DC
  Filled 2021-04-20: qty 30, 30d supply, fill #0

## 2021-04-21 ENCOUNTER — Other Ambulatory Visit: Payer: Self-pay

## 2021-06-03 ENCOUNTER — Encounter: Payer: Self-pay | Admitting: Nurse Practitioner

## 2021-06-03 ENCOUNTER — Other Ambulatory Visit: Payer: Self-pay

## 2021-06-03 ENCOUNTER — Encounter: Payer: Self-pay | Admitting: Family Medicine

## 2021-06-03 ENCOUNTER — Ambulatory Visit: Payer: Self-pay | Attending: Nurse Practitioner | Admitting: Nurse Practitioner

## 2021-06-03 DIAGNOSIS — E785 Hyperlipidemia, unspecified: Secondary | ICD-10-CM

## 2021-06-03 DIAGNOSIS — Z1159 Encounter for screening for other viral diseases: Secondary | ICD-10-CM

## 2021-06-03 DIAGNOSIS — R7303 Prediabetes: Secondary | ICD-10-CM

## 2021-06-03 DIAGNOSIS — I10 Essential (primary) hypertension: Secondary | ICD-10-CM

## 2021-06-03 DIAGNOSIS — T502X5A Adverse effect of carbonic-anhydrase inhibitors, benzothiadiazides and other diuretics, initial encounter: Secondary | ICD-10-CM

## 2021-06-03 DIAGNOSIS — J069 Acute upper respiratory infection, unspecified: Secondary | ICD-10-CM

## 2021-06-03 DIAGNOSIS — I5022 Chronic systolic (congestive) heart failure: Secondary | ICD-10-CM

## 2021-06-03 DIAGNOSIS — E876 Hypokalemia: Secondary | ICD-10-CM

## 2021-06-03 MED ORDER — SPIRONOLACTONE 25 MG PO TABS
ORAL_TABLET | Freq: Every day | ORAL | 2 refills | Status: DC
  Filled 2021-06-03: qty 30, 30d supply, fill #0
  Filled 2021-08-02: qty 30, 30d supply, fill #1

## 2021-06-03 MED ORDER — HYDROCODONE BIT-HOMATROP MBR 5-1.5 MG/5ML PO SOLN
5.0000 mL | Freq: Three times a day (TID) | ORAL | 0 refills | Status: DC | PRN
  Filled 2021-06-03: qty 120, 8d supply, fill #0

## 2021-06-03 MED ORDER — ISOSORB DINITRATE-HYDRALAZINE 20-37.5 MG PO TABS
1.0000 | ORAL_TABLET | Freq: Three times a day (TID) | ORAL | 0 refills | Status: DC
  Filled 2021-06-03: qty 90, 30d supply, fill #0

## 2021-06-03 MED ORDER — BENZONATATE 100 MG PO CAPS
200.0000 mg | ORAL_CAPSULE | Freq: Two times a day (BID) | ORAL | 0 refills | Status: DC | PRN
  Filled 2021-06-03: qty 80, 20d supply, fill #0

## 2021-06-03 MED ORDER — FUROSEMIDE 40 MG PO TABS
ORAL_TABLET | Freq: Every day | ORAL | 0 refills | Status: DC
Start: 2021-06-03 — End: 2021-08-02
  Filled 2021-06-03: qty 30, 30d supply, fill #0

## 2021-06-03 MED ORDER — SACUBITRIL-VALSARTAN 24-26 MG PO TABS
1.0000 | ORAL_TABLET | Freq: Two times a day (BID) | ORAL | 2 refills | Status: DC
  Filled 2021-06-03: qty 60, 30d supply, fill #0

## 2021-06-03 MED ORDER — HYDROCODONE BIT-HOMATROP MBR 5-1.5 MG/5ML PO SOLN: 5.0000 mL | Freq: Three times a day (TID) | ORAL | 0 refills | Status: DC | PRN

## 2021-06-03 MED ORDER — DAPAGLIFLOZIN PROPANEDIOL 5 MG PO TABS
ORAL_TABLET | Freq: Every day | ORAL | 3 refills | Status: DC
  Filled 2021-06-03: qty 30, 30d supply, fill #0
  Filled 2021-08-02: qty 30, 30d supply, fill #1

## 2021-06-03 MED ORDER — CARVEDILOL 25 MG PO TABS
ORAL_TABLET | Freq: Two times a day (BID) | ORAL | 3 refills | Status: DC
  Filled 2021-06-03: qty 60, 30d supply, fill #0
  Filled 2021-08-02: qty 60, 30d supply, fill #1

## 2021-06-03 MED ORDER — ATORVASTATIN CALCIUM 40 MG PO TABS
ORAL_TABLET | Freq: Every day | ORAL | 3 refills | Status: DC
Start: 2021-06-03 — End: 2021-12-28
  Filled 2021-06-03: qty 30, 30d supply, fill #0
  Filled 2021-08-02: qty 30, 30d supply, fill #1
  Filled 2021-12-13: qty 30, 30d supply, fill #0

## 2021-06-03 NOTE — Progress Notes (Signed)
Virtual Visit via Telephone Note Due to national recommendations of social distancing due to [place] 19, telehealth visit is felt to be most appropriate for this patient at this time.  I discussed the limitations, risks, security and privacy concerns of performing an evaluation and management service by telephone and the availability of in person appointments. I also discussed with the patient that there may be a patient responsible charge related to this service. The patient expressed understanding and agreed to proceed.    I connected with Steve Carrillo on 06/03/21  at   3:10 PM EDT  EDT by telephone and verified that I am speaking with the correct person using two identifiers.  Location of Patient: Private Residence   Location of Provider: [place] and [employer] Office    Persons participating in Telemedicine visit: Geryl Rankins FNP-BC Kleber J Luckman    History of Present Illness: Telemedicine visit for: Follow up  has a past medical history of Cardiomyopathy due to hypertension ([place]) (10/2014), Dilated aortic root ([place]), Hyperlipidemia LDL goal <130 (2015), Hypertension, and Prediabetes.   He has not followed up with Cardiology EF 40% in 2017. Cardiomegaly, chronic systolic and diastolic heart failure.  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   He has been out of work since 3 days ago on Wednesday with symptoms of productive cough and chest congestion. Denies fever, nausea, vomiting or sore throat.  He had a headache a few days ago however currently denies. He has not been tested for COVID. He has been vaccinated.    Essential Hypertension Blood pressure is not well controlled. He endorses medication adherence however a few of his blood pressure medications have expired. There may be a component of non adherence as well. Currently prescribed: Coreg  25 mg BID, lasix 40 mg daily, Bidil 20-37.5 mg  TID, Entresto 24-26 mg BID and spironolactone 25 mg daily. Denies palpitations, lightheadedness, dizziness, headaches or BLE edema.   BP Readings from Last 3 Encounters:  03/03/21 (!) 160/90  08/12/20 (!) 135/96  09/30/19 (!) 181/123     Prediabetes Well controlled with farxiga 5 mg daily.  Lab Results  Component Value Date   HGBA1C 5.7 (H) 08/07/2020    Past Medical History:  Diagnosis Date   Cardiomyopathy due to hypertension ([place]) 10/2014   NICM EF 35-40%, global LV strain is abnormal at -10.9%   Dilated aortic root ([place])    seen on previous echo 02/2016   Hyperlipidemia LDL goal <130 2015   Hypertension    Prediabetes     Past Surgical History:  Procedure Laterality Date   HERNIA REPAIR     LEFT HEART CATHETERIZATION WITH CORONARY ANGIOGRAM N/A 10/22/2014   Procedure: LEFT HEART CATHETERIZATION WITH CORONARY ANGIOGRAM;  Surgeon: Peter M Martinique, MD; Normal coronary anatomy. Vessels are very large due to hypertensive cardiomyopathy.    RIGHT/LEFT HEART CATH AND CORONARY ANGIOGRAPHY N/A 08/10/2020   Procedure: RIGHT/LEFT HEART CATH AND CORONARY ANGIOGRAPHY;  Surgeon: Martinique, Peter M, MD;  Location: [facility];  Service: Cardiovascular;  Laterality: N/A;    Family History  Problem Relation Age of Onset   Diabetes Mellitus II Mother    Hypertension Mother    Hypertension Brother    Diabetes Mellitus II Maternal Grandmother     Social History   Socioeconomic History   Marital status: Married    Spouse name: Not on file   Number of children: Not on file  Years of education: Not on file   Highest education level: Not on file  Occupational History   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    Social Determinants of Health   Financial Resource Strain: Not on file  Food Insecurity: Not on file  Transportation Needs: Not on file  Physical Activity: Not on file  Stress: Not on file  Social Connections: Not on file     Observations/Objective: Awake, alert and oriented x 3   ROS  Assessment and Plan: Steve Carrillo was seen today for cough.  Diagnoses and all orders for this visit:  Upper respiratory infection, viral -     benzonatate (TESSALON) 100 MG capsule; Take 2 capsules (200 mg total) by mouth 2 (two) times daily as needed for cough. -     HYDROcodone bit-homatropine (HYCODAN) 5-1.5 MG/5ML syrup; Take 5 mLs by mouth every 8 (eight) hours as needed for cough. -     CBC with Differential; Future  Chronic systolic congestive heart failure (HCC) -     dapagliflozin propanediol (FARXIGA) 5 MG TABS tablet; TAKE 1 TABLET (5 MG TOTAL) BY MOUTH DAILY BEFORE BREAKFAST. -     furosemide (LASIX) 40 MG tablet; TAKE 1 TABLET (40 MG TOTAL) BY MOUTH DAILY. -     isosorbide-hydrALAZINE (BIDIL) 20-37.5 MG tablet; TAKE 1 TABLET BY MOUTH 3 (THREE) TIMES DAILY. -     sacubitril-valsartan (ENTRESTO) 24-26 MG; TAKE 1 TABLET BY MOUTH 2 (TWO) TIMES DAILY. -     spironolactone (ALDACTONE) 25 MG tablet; TAKE 1 TABLET (25 MG TOTAL) BY MOUTH DAILY. -     carvedilol (COREG) 25 MG tablet; TAKE 1 TABLET (25 MG TOTAL) BY MOUTH 2 (TWO) TIMES DAILY.  Essential hypertension, malignant -     isosorbide-hydrALAZINE (BIDIL) 20-37.5 MG tablet; TAKE 1 TABLET BY MOUTH 3 (THREE) TIMES DAILY. -     spironolactone (ALDACTONE) 25 MG tablet; TAKE 1 TABLET (25 MG TOTAL) BY MOUTH DAILY. -     carvedilol (COREG) 25 MG tablet; TAKE 1 TABLET (25 MG TOTAL) BY MOUTH 2 (TWO) TIMES DAILY. -     CMP14+EGFR; Future DASH DIET Follow up with  Cardiology Patient has been advised to apply for financial assistance and schedule to see our financial counselor.    Diuretic-induced hypokalemia -     spironolactone (ALDACTONE) 25 MG tablet; TAKE 1 TABLET (25 MG TOTAL) BY MOUTH DAILY.  Hyperlipidemia LDL goal <130 -     atorvastatin (LIPITOR) 40 MG tablet; TAKE 1 TABLET (40 MG TOTAL) BY MOUTH DAILY. -     Lipid panel; Future  Need for hepatitis C screening test -     HCV Ab w Reflex to Quant PCR; Future  Prediabetes -     Hemoglobin A1c; Future   Follow Up Instructions Return in about 3 months (around 09/03/2021).     I discussed the assessment and treatment plan with the patient. The patient  was provided an opportunity to ask questions and all were answered. The patient agreed with the plan and demonstrated an understanding of the instructions.   The patient was advised to call back or seek an in-person evaluation if the symptoms worsen or if the condition fails to improve as anticipated.  I provided 14 minutes of non-face-to-face time during this encounter including median intraservice time, reviewing previous notes, labs, imaging, medications and explaining diagnosis and management.  [name], FNP-BC

## 2021-06-03 NOTE — Progress Notes (Signed)
Has had cough and chest congestion for 2 days. No fever.  Has been out of work and needs note.  Needs medication refills.

## 2021-06-07 ENCOUNTER — Other Ambulatory Visit: Payer: Self-pay

## 2021-06-07 ENCOUNTER — Ambulatory Visit: Payer: Self-pay | Attending: Nurse Practitioner

## 2021-06-07 DIAGNOSIS — Z1159 Encounter for screening for other viral diseases: Secondary | ICD-10-CM

## 2021-06-07 DIAGNOSIS — R7303 Prediabetes: Secondary | ICD-10-CM

## 2021-06-07 DIAGNOSIS — J069 Acute upper respiratory infection, unspecified: Secondary | ICD-10-CM

## 2021-06-07 DIAGNOSIS — E785 Hyperlipidemia, unspecified: Secondary | ICD-10-CM

## 2021-06-07 DIAGNOSIS — I10 Essential (primary) hypertension: Secondary | ICD-10-CM

## 2021-06-08 LAB — CBC WITH DIFFERENTIAL/PLATELET
Basophils Absolute: 0.1 10*3/uL (ref 0.0–0.2)
Basos: 1 %
EOS (ABSOLUTE): 0.1 10*3/uL (ref 0.0–0.4)
Eos: 3 %
Hematocrit: 35.2 % — ABNORMAL LOW (ref 37.5–51.0)
Hemoglobin: 11.3 g/dL — ABNORMAL LOW (ref 13.0–17.7)
Immature Grans (Abs): 0 10*3/uL (ref 0.0–0.1)
Immature Granulocytes: 0 %
Lymphocytes Absolute: 1.7 10*3/uL (ref 0.7–3.1)
Lymphs: 39 %
MCH: 25.3 pg — ABNORMAL LOW (ref 26.6–33.0)
MCHC: 32.1 g/dL (ref 31.5–35.7)
MCV: 79 fL (ref 79–97)
Monocytes Absolute: 0.3 10*3/uL (ref 0.1–0.9)
Monocytes: 6 %
Neutrophils Absolute: 2.2 10*3/uL (ref 1.4–7.0)
Neutrophils: 51 %
Platelets: 396 10*3/uL (ref 150–450)
RBC: 4.47 x10E6/uL (ref 4.14–5.80)
RDW: 13.3 % (ref 11.6–15.4)
WBC: 4.3 10*3/uL (ref 3.4–10.8)

## 2021-06-08 LAB — CMP14+EGFR
ALT: 21 IU/L (ref 0–44)
AST: 13 IU/L (ref 0–40)
Albumin/Globulin Ratio: 1.6 (ref 1.2–2.2)
Albumin: 4.1 g/dL (ref 4.0–5.0)
Alkaline Phosphatase: 93 IU/L (ref 44–121)
BUN/Creatinine Ratio: 14 (ref 9–20)
BUN: 24 mg/dL — ABNORMAL HIGH (ref 6–20)
Bilirubin Total: 0.4 mg/dL (ref 0.0–1.2)
CO2: 21 mmol/L (ref 20–29)
Calcium: 8.9 mg/dL (ref 8.7–10.2)
Chloride: 104 mmol/L (ref 96–106)
Creatinine, Ser: 1.67 mg/dL — ABNORMAL HIGH (ref 0.76–1.27)
Globulin, Total: 2.5 g/dL (ref 1.5–4.5)
Glucose: 127 mg/dL — ABNORMAL HIGH (ref 65–99)
Potassium: 4.4 mmol/L (ref 3.5–5.2)
Sodium: 139 mmol/L (ref 134–144)
Total Protein: 6.6 g/dL (ref 6.0–8.5)
eGFR: 53 mL/min/{1.73_m2} — ABNORMAL LOW (ref >59)

## 2021-06-08 LAB — LIPID PANEL
Chol/HDL Ratio: 3.1 ratio (ref 0.0–5.0)
Cholesterol, Total: 119 mg/dL (ref 100–199)
HDL: 39 mg/dL — ABNORMAL LOW (ref >39)
LDL Chol Calc (NIH): 64 mg/dL (ref 0–99)
Triglycerides: 79 mg/dL (ref 0–149)
VLDL Cholesterol Cal: 16 mg/dL (ref 5–40)

## 2021-06-08 LAB — HCV INTERPRETATION

## 2021-06-08 LAB — HEMOGLOBIN A1C
Est. average glucose Bld gHb Est-mCnc: 123 mg/dL
Hgb A1c MFr Bld: 5.9 % — ABNORMAL HIGH (ref 4.8–5.6)

## 2021-06-08 LAB — HCV AB W REFLEX TO QUANT PCR: HCV Ab: <0.1 s/co ratio (ref 0.0–0.9)

## 2021-06-09 ENCOUNTER — Other Ambulatory Visit: Payer: Self-pay

## 2021-06-11 ENCOUNTER — Other Ambulatory Visit: Payer: Self-pay | Admitting: Nurse Practitioner

## 2021-06-11 DIAGNOSIS — D649 Anemia, unspecified: Secondary | ICD-10-CM

## 2021-06-11 DIAGNOSIS — Z1211 Encounter for screening for malignant neoplasm of colon: Secondary | ICD-10-CM

## 2021-07-11 ENCOUNTER — Emergency Department (HOSPITAL_COMMUNITY)
Admission: EM | Admit: 2021-07-11 | Discharge: 2021-07-11 | Disposition: A | Discharge status: Home / Self Care | Payer: Self-pay | Attending: Emergency Medicine | Admitting: Emergency Medicine

## 2021-07-11 ENCOUNTER — Emergency Department (HOSPITAL_COMMUNITY): Payer: Self-pay

## 2021-07-11 ENCOUNTER — Other Ambulatory Visit: Payer: Self-pay

## 2021-07-11 ENCOUNTER — Encounter (HOSPITAL_COMMUNITY): Payer: Self-pay | Admitting: *Deleted

## 2021-07-11 DIAGNOSIS — N182 Chronic kidney disease, stage 2 (mild): Secondary | ICD-10-CM | POA: Insufficient documentation

## 2021-07-11 DIAGNOSIS — Z955 Presence of coronary angioplasty implant and graft: Secondary | ICD-10-CM | POA: Insufficient documentation

## 2021-07-11 DIAGNOSIS — I130 Hypertensive heart and chronic kidney disease with heart failure and stage 1 through stage 4 chronic kidney disease, or unspecified chronic kidney disease: Secondary | ICD-10-CM | POA: Insufficient documentation

## 2021-07-11 DIAGNOSIS — Z87891 Personal history of nicotine dependence: Secondary | ICD-10-CM | POA: Insufficient documentation

## 2021-07-11 DIAGNOSIS — Z79899 Other long term (current) drug therapy: Secondary | ICD-10-CM | POA: Insufficient documentation

## 2021-07-11 DIAGNOSIS — R059 Cough, unspecified: Secondary | ICD-10-CM

## 2021-07-11 DIAGNOSIS — Z7984 Long term (current) use of oral hypoglycemic drugs: Secondary | ICD-10-CM | POA: Insufficient documentation

## 2021-07-11 DIAGNOSIS — E1122 Type 2 diabetes mellitus with diabetic chronic kidney disease: Secondary | ICD-10-CM | POA: Insufficient documentation

## 2021-07-11 DIAGNOSIS — U071 COVID-19: Secondary | ICD-10-CM | POA: Insufficient documentation

## 2021-07-11 DIAGNOSIS — I5043 Acute on chronic combined systolic (congestive) and diastolic (congestive) heart failure: Secondary | ICD-10-CM | POA: Insufficient documentation

## 2021-07-11 LAB — COMPREHENSIVE METABOLIC PANEL
ALT: 19 U/L (ref 0–44)
AST: 16 U/L (ref 15–41)
Albumin: 3.8 g/dL (ref 3.5–5.0)
Alkaline Phosphatase: 68 U/L (ref 38–126)
Anion gap: 5 (ref 5–15)
BUN: 18 mg/dL (ref 6–20)
CO2: 30 mmol/L (ref 22–32)
Calcium: 9.2 mg/dL (ref 8.9–10.3)
Chloride: 104 mmol/L (ref 98–111)
Creatinine, Ser: 1.69 mg/dL — ABNORMAL HIGH (ref 0.61–1.24)
GFR, Estimated: 52 mL/min — ABNORMAL LOW (ref >60)
Glucose, Bld: 113 mg/dL — ABNORMAL HIGH (ref 70–99)
Potassium: 3.2 mmol/L — ABNORMAL LOW (ref 3.5–5.1)
Sodium: 139 mmol/L (ref 135–145)
Total Bilirubin: 1 mg/dL (ref 0.3–1.2)
Total Protein: 7.3 g/dL (ref 6.5–8.1)

## 2021-07-11 LAB — CBC WITH DIFFERENTIAL/PLATELET
Abs Immature Granulocytes: 0.04 10*3/uL (ref 0.00–0.07)
Basophils Absolute: 0.1 10*3/uL (ref 0.0–0.1)
Basophils Relative: 1 %
Eosinophils Absolute: 0.2 10*3/uL (ref 0.0–0.5)
Eosinophils Relative: 2 %
HCT: 37 % — ABNORMAL LOW (ref 39.0–52.0)
Hemoglobin: 11.1 g/dL — ABNORMAL LOW (ref 13.0–17.0)
Immature Granulocytes: 0 %
Lymphocytes Relative: 19 %
Lymphs Abs: 1.8 10*3/uL (ref 0.7–4.0)
MCH: 24 pg — ABNORMAL LOW (ref 26.0–34.0)
MCHC: 30 g/dL (ref 30.0–36.0)
MCV: 80.1 fL (ref 80.0–100.0)
Monocytes Absolute: 0.8 10*3/uL (ref 0.1–1.0)
Monocytes Relative: 9 %
Neutro Abs: 6.4 10*3/uL (ref 1.7–7.7)
Neutrophils Relative %: 69 %
Platelets: 366 10*3/uL (ref 150–400)
RBC: 4.62 MIL/uL (ref 4.22–5.81)
RDW: 15.1 % (ref 11.5–15.5)
WBC: 9.4 10*3/uL (ref 4.0–10.5)
nRBC: 0 % (ref 0.0–0.2)

## 2021-07-11 MED ORDER — BENZONATATE 100 MG PO CAPS
100.0000 mg | ORAL_CAPSULE | Freq: Three times a day (TID) | ORAL | 0 refills | Status: DC | PRN
  Filled 2021-07-11: qty 21, 7d supply, fill #0

## 2021-07-11 NOTE — ED Notes (Signed)
Pt ambulatory in ED lobby. Pt currently has no complaints or concerns at this time.

## 2021-07-11 NOTE — Discharge Instructions (Addendum)
You have been evaluated for your cough.  Fortunately your chest x-ray did not show any signs of pneumonia.  A COVID test have been obtained however you can check the result through MyChart, link below.  Take Tessalon as needed for cough.  If you test positive for COVID, follow instruction below.  Recommendations for at home COVID-19 symptoms management:  Please continue isolation at home. Call [phone] to see whether you might be eligible for therapeutic antibody infusions (leave your name and they will call you back).  If have acute worsening of symptoms please go to ER/urgent care for further evaluation. Check pulse oximetry and if below 90-92% please go to ER. The following supplements MAY help:  Vitamin C 500mg  twice a day and Quercetin 250-500 mg twice a day Vitamin D3 2000 - 4000 u/day B Complex vitamins Zinc 75-100 mg/day Melatonin 6-10 mg at night (the optimal dose is unknown) Aspirin 81mg /day (if no history of bleeding issues)

## 2021-07-11 NOTE — ED Triage Notes (Signed)
Pt complaining if dry cough for past three days. No fever or sore throat and no known contact with anyone that has been sick recently.

## 2021-07-11 NOTE — ED Provider Notes (Signed)
[facility]-EMERGENCY DEPT Provider Note   CSN: [id number] Arrival date & time: 07/11/21  1658     History Chief Complaint  Patient presents with   Cough    Steve Carrillo is a 40 y.o. male.  The history is provided by the patient and medical records. No language interpreter was used.  Cough  40 year old male significant history of CHF, obesity, kidney disease, cardiomyopathy, hypertension, who presents for evaluation of a cough.  Patient report for the past 2 to 3 days he has had a dry cough.  Cough is steady throughout the day.  He did not report any fever, chills, runny nose, sneezing, sore throat, loss of taste or smell, chest pain, shortness of breath, abdominal pain, nausea vomiting diarrhea.  He denies any recent medication changes, and denies any recent sick contact.  Patient has been vaccinated for COVID-19.  States his symptoms mild.  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) 10/2014   NICM EF 35-40%, global LV strain is abnormal at -10.9%   Dilated aortic root (HCC)    seen on previous echo 02/2016   Hyperlipidemia LDL goal <130 2015   Hypertension    Prediabetes     Patient Active Problem List   Diagnosis Date Noted   Hypokalemia 08/07/2020   Acute exacerbation of CHF (congestive heart failure) (HCC) 08/07/2020   Hyperlipidemia 08/06/2020   Abnormal CT of the chest 08/06/2020   Morbid obesity (HCC) 08/03/2016   Essential hypertension, malignant 06/22/2016   Chronic kidney disease, stage 2, mildly decreased GFR 06/22/2016   OSA (obstructive sleep apnea) 05/17/2016   Diuretic-induced hypokalemia 03/14/2016   Hyperlipidemia with target LDL less than 130 03/14/2016   Prediabetes 02/20/2016   Routine general medical examination at a health care facility 11/05/2014   Acute on chronic combined systolic and diastolic CHF (congestive heart failure) (HCC) 10/24/2014   Cardiomyopathy  (HCC) 10/21/2014   Elevated serum creatinine 10/19/2014   Prolonged Q-T interval on ECG 10/19/2014    Past Surgical History:  Procedure Laterality Date   HERNIA REPAIR     LEFT HEART CATHETERIZATION WITH CORONARY ANGIOGRAM N/A 10/22/2014   Procedure: LEFT HEART CATHETERIZATION WITH CORONARY ANGIOGRAM;  Surgeon: Peter M Swaziland, MD; Normal coronary anatomy. Vessels are very large due to hypertensive cardiomyopathy.    RIGHT/LEFT HEART CATH AND CORONARY ANGIOGRAPHY N/A 08/10/2020   Procedure: RIGHT/LEFT HEART CATH AND CORONARY ANGIOGRAPHY;  Surgeon: Swaziland, Peter M, MD;  Location: [facility] INVASIVE CV LAB;  Service: Cardiovascular;  Laterality: N/A;       Family History  Problem Relation Age of Onset   Diabetes Mellitus II Mother    Hypertension Mother    Hypertension Brother    Diabetes Mellitus II Maternal Grandmother     Social History   Tobacco Use   Smoking status: Former    Types: Cigarettes    Quit date: 09/14/2014    Years since quitting: 6.8   Smokeless tobacco: Never  Vaping Use   Vaping Use: Never used  Substance Use Topics   Alcohol use: Not Currently    Comment: Twice a month   Drug use: Yes    Types: Marijuana    Comment: 07/10/21    Home Medications Prior to Admission medications   Medication Sig Start Date End Date Taking? Authorizing Provider  atorvastatin (LIPITOR) 40 MG tablet TAKE 1 TABLET (40 MG TOTAL) BY MOUTH DAILY. 06/03/21 09/01/21  Claiborne Rigg,  NP  benzonatate (TESSALON) 100 MG capsule Take 2 capsules (200 mg total) by mouth 2 (two) times daily as needed for cough. 06/03/21   Claiborne Rigg, NP  carvedilol (COREG) 25 MG tablet TAKE 1 TABLET (25 MG TOTAL) BY MOUTH 2 (TWO) TIMES DAILY. 06/03/21 06/03/22  Claiborne Rigg, NP  dapagliflozin propanediol (FARXIGA) 5 MG TABS tablet TAKE 1 TABLET (5 MG TOTAL) BY MOUTH DAILY BEFORE BREAKFAST. 06/03/21 06/03/22  Claiborne Rigg, NP  furosemide (LASIX) 40 MG tablet TAKE 1 TABLET (40 MG TOTAL) BY MOUTH DAILY. 06/03/21  06/03/22  Claiborne Rigg, NP  HYDROcodone bit-homatropine (HYCODAN) 5-1.5 MG/5ML syrup Take 5 mLs by mouth every 8 (eight) hours as needed for cough. 06/03/21   Claiborne Rigg, NP  isosorbide-hydrALAZINE (BIDIL) 20-37.5 MG tablet TAKE 1 TABLET BY MOUTH 3 (THREE) TIMES DAILY. 06/03/21 06/03/22  Claiborne Rigg, NP  sacubitril-valsartan (ENTRESTO) 24-26 MG TAKE 1 TABLET BY MOUTH 2 (TWO) TIMES DAILY. 06/03/21 06/03/22  Claiborne Rigg, NP  spironolactone (ALDACTONE) 25 MG tablet TAKE 1 TABLET (25 MG TOTAL) BY MOUTH DAILY. 06/03/21 07/03/21  Claiborne Rigg, NP    Allergies    Patient has no known allergies.  Review of Systems   Review of Systems  Respiratory:  Positive for cough.   All other systems reviewed and are negative.  Physical Exam Updated Vital Signs BP (!) 186/138 (BP Location: Right Arm)   Pulse (!) 106   Temp 99.2 F (37.3 C) (Oral)   Resp 18   Ht 6\' 1"  (1.854 m)   Wt 133.8 kg   SpO2 98%   BMI 38.92 kg/m   Physical Exam Vitals and nursing note reviewed.  Constitutional:      General: He is not in acute distress.    Appearance: He is well-developed.  HENT:     Head: Atraumatic.  Eyes:     Conjunctiva/sclera: Conjunctivae normal.  Cardiovascular:     Rate and Rhythm: Normal rate and regular rhythm.     Pulses: Normal pulses.     Heart sounds: Normal heart sounds.  Pulmonary:     Effort: Pulmonary effort is normal.     Breath sounds: Normal breath sounds. No wheezing, rhonchi or rales.  Abdominal:     Palpations: Abdomen is soft.  Musculoskeletal:     Cervical back: Neck supple.     Right lower leg: No edema.     Left lower leg: No edema.  Skin:    Findings: No rash.  Neurological:     Mental Status: He is alert. Mental status is at baseline.    ED Results / Procedures / Treatments   Labs (all labs ordered are listed, but only abnormal results are displayed) Labs Reviewed  CBC WITH DIFFERENTIAL/PLATELET - Abnormal; Notable for the following components:       Result Value   Hemoglobin 11.1 (*)    HCT 37.0 (*)    MCH 24.0 (*)    All other components within normal limits  COMPREHENSIVE METABOLIC PANEL - Abnormal; Notable for the following components:   Potassium 3.2 (*)    Glucose, Bld 113 (*)    Creatinine, Ser 1.69 (*)    GFR, Estimated 52 (*)    All other components within normal limits  SARS CORONAVIRUS 2 (TAT 6-24 HRS)  RESP PANEL BY RT-PCR (FLU A&B, COVID) [id number]    EKG None  Radiology DG Chest Portable 1 View  Result Date: 07/11/2021 CLINICAL DATA:  Cough and  shortness of breath. Upper respiratory symptoms. EXAM: PORTABLE CHEST 1 VIEW COMPARISON:  Radiograph and CT 08/06/2020 FINDINGS: Prominent cardiomegaly, but stable from prior exam. Improved pulmonary edema. There may be minimal vascular congestion. Suspected small pleural effusions no confluent airspace disease. No pneumothorax. Stable osseous structures. IMPRESSION: 1. Cardiomegaly, stable from prior exam. Improved pulmonary edema from prior, with mild vascular congestion. 2. Suspected small pleural effusions. Electronically Signed   By: Narda Rutherford M.D.   On: 07/11/2021 18:59    Procedures Procedures   Medications Ordered in ED Medications - No data to display  ED Course  I have reviewed the triage vital signs and the nursing notes.  Pertinent labs & imaging results that were available during my care of the patient were reviewed by me and considered in my medical decision making (see chart for details).    MDM Rules/Calculators/A&P                           BP (!) 186/138 (BP Location: Right Arm)   Pulse (!) 106   Temp 99.2 F (37.3 C) (Oral)   Resp 18   Ht 6\' 1"  (1.854 m)   Wt 133.8 kg   SpO2 98%   BMI 38.92 kg/m   Final Clinical Impression(s) / ED Diagnoses Final diagnoses:  Cough    Rx / DC Orders ED Discharge Orders          Ordered    benzonatate (TESSALON) 100 MG capsule  3 times daily PRN        07/11/21 2331           11:27  PM Patient report dry cough for the past 2 to 3 days.  He is without any other complaint.  He has been vaccinated for COVID-19.  His vital signs remarkable for hypertension with a blood pressure of 186/138.  He will need to have his blood pressure rechecked by his PCP to make sure he is taking his medication appropriately.  He is not on any ACE inhibitor that can contribute to his cough.  He is tachycardic with a heart rate of 106 however denies any significant shortness of breath or pleuritic chest pain.  I have low suspicion for PE causing his cough.  His chest x-ray is similar to prior but improved.  His creatinine is 1.69 but with a history of renal disease I suspect this is likely his baseline.  COVID test obtained and will not result during this ER visit.  Patient can follow-up on the results through MyChart.  Will provide this symptom control but patient otherwise stable for discharge.   09/10/21, PA-C 07/11/21 2332    09/10/21, MD 07/12/21 [phone]

## 2021-07-11 NOTE — ED Notes (Addendum)
Pt did not take BP meds today  

## 2021-07-11 NOTE — ED Provider Notes (Signed)
Emergency Medicine Provider Triage Evaluation Note  Steve Carrillo , a 39 y.o. male  was evaluated in triage.  Pt complains of cough, congestion which has been ongoing since the last week.  Also noted elevated BP, currently takes his medication for blood pressure control.  Review of Systems  Positive: Fever, cough, shortness of breat Negative: Abdominal pain, headache, chest pain  Physical Exam  BP (!) 209/133 (BP Location: Left Arm)   Pulse (!) 103   Temp 99.2 F (37.3 C) (Oral)   Resp 18   Ht 6\' 1"  (1.854 m)   Wt 133.8 kg   SpO2 96%   BMI 38.92 kg/m  Gen:   Awake, no distress   Resp:  Normal effort  MSK:   Moves extremities without difficulty  Other:  Diaphoretic with dry cough and elevated blood pressure.  No recent COVID-19 infections, unknown vaccination status.  Medical Decision Making  Medically screening exam initiated at 6:38 PM.  Appropriate orders placed.  Steve Carrillo was informed that the remainder of the evaluation will be completed by another provider, this initial triage assessment does not replace that evaluation, and the importance of remaining in the ED until their evaluation is complete.  Upper respiratory symptoms for the past week including a dry cough, febrile with a temperature of 99.2 on arrival to the ED, diaphoretic and elevated BP with a systolic in the 200s.  COVID-19, chest x-ray have been ordered.   , PA-C 07/11/21 1839    09/10/21, MD 07/12/21 1019

## 2021-07-11 NOTE — ED Notes (Signed)
Pt reports he did not take his blood pressure medication today.

## 2021-07-12 ENCOUNTER — Other Ambulatory Visit: Payer: Self-pay

## 2021-07-12 LAB — SARS CORONAVIRUS 2 (TAT 6-24 HRS): SARS Coronavirus 2: POSITIVE — AB

## 2021-07-12 NOTE — ED Notes (Signed)
MD aware of pt's elevated BP and that pt did not take his bp meds today.

## 2021-08-02 ENCOUNTER — Other Ambulatory Visit: Payer: Self-pay | Admitting: Nurse Practitioner

## 2021-08-02 ENCOUNTER — Other Ambulatory Visit: Payer: Self-pay

## 2021-08-02 DIAGNOSIS — I10 Essential (primary) hypertension: Secondary | ICD-10-CM

## 2021-08-02 DIAGNOSIS — I5022 Chronic systolic (congestive) heart failure: Secondary | ICD-10-CM

## 2021-08-03 ENCOUNTER — Other Ambulatory Visit: Payer: Self-pay

## 2021-08-03 MED ORDER — FUROSEMIDE 40 MG PO TABS
ORAL_TABLET | Freq: Every day | ORAL | 0 refills | Status: DC
  Filled 2021-08-03: qty 30, 30d supply, fill #0

## 2021-08-03 MED ORDER — ISOSORB DINITRATE-HYDRALAZINE 20-37.5 MG PO TABS
1.0000 | ORAL_TABLET | Freq: Three times a day (TID) | ORAL | 0 refills | Status: DC
  Filled 2021-08-03: qty 90, 30d supply, fill #0

## 2021-08-03 NOTE — Telephone Encounter (Signed)
Requested Prescriptions  Pending Prescriptions Disp Refills  . furosemide (LASIX) 40 MG tablet 90 tablet 0    Sig: TAKE 1 TABLET (40 MG TOTAL) BY MOUTH DAILY.     Cardiovascular:  Diuretics - Loop Failed - 08/02/2021  4:52 PM      Failed - K in normal range and within 360 days    Potassium  Date Value Ref Range Status  07/11/2021 3.2 (L) 3.5 - 5.1 mmol/L Final         Failed - Cr in normal range and within 360 days    Creat  Date Value Ref Range Status  10/19/2016 1.32 0.60 - 1.35 mg/dL Final   Creatinine, Ser  Date Value Ref Range Status  07/11/2021 1.69 (H) 0.61 - 1.24 mg/dL Final   Creatinine, Urine  Date Value Ref Range Status  10/21/2014 240.9 mg/dL Final    Comment:    No reference range established. Performed at [facility]          Failed - Last BP in normal range    BP Readings from Last 1 Encounters:  07/11/21 (!) 205/156         Passed - Ca in normal range and within 360 days    Calcium  Date Value Ref Range Status  07/11/2021 9.2 8.9 - 10.3 mg/dL Final   Calcium, Ion  Date Value Ref Range Status  08/10/2020 1.23 1.15 - 1.40 mmol/L Final         Passed - Na in normal range and within 360 days    Sodium  Date Value Ref Range Status  07/11/2021 139 135 - 145 mmol/L Final  06/07/2021 139 134 - 144 mmol/L Final         Passed - Valid encounter within last 6 months    Recent Outpatient Visits          2 months ago Upper respiratory infection, viral   [facility] [place], [place] W, NP   5 months ago Hyperlipidemia LDL goal <130   [facility] [place], Odette Horns, MD   11 months ago Encounter to establish care   [facility] [place], [place] W, NP             . isosorbide-hydrALAZINE (BIDIL) 20-37.5 MG tablet 180 tablet 0    Sig: TAKE 1 TABLET BY MOUTH 3 (THREE) TIMES DAILY.     Cardiovascular:  Vasodilators Failed - 08/02/2021  4:52 PM      Failed - HCT  in normal range and within 360 days    HCT  Date Value Ref Range Status  07/11/2021 37.0 (L) 39.0 - 52.0 % Final   Hematocrit  Date Value Ref Range Status  06/07/2021 35.2 (L) 37.5 - 51.0 % Final         Failed - HGB in normal range and within 360 days    Hemoglobin  Date Value Ref Range Status  07/11/2021 11.1 (L) 13.0 - 17.0 g/dL Final  99/35/7017 79.3 (L) 13.0 - 17.7 g/dL Final         Failed - Last BP in normal range    BP Readings from Last 1 Encounters:  07/11/21 (!) 205/156         Passed - RBC in normal range and within 360 days    RBC  Date Value Ref Range Status  07/11/2021 4.62 4.22 - 5.81 MIL/uL Final  Passed - WBC in normal range and within 360 days    WBC  Date Value Ref Range Status  07/11/2021 9.4 4.0 - 10.5 K/uL Final         Passed - PLT in normal range and within 360 days    Platelets  Date Value Ref Range Status  07/11/2021 366 150 - 400 K/uL Final  06/07/2021 396 150 - 450 x10E3/uL Final         Passed - Valid encounter within last 12 months    Recent Outpatient Visits          2 months ago Upper respiratory infection, viral   [facility] [place], Shea Stakes, NP   5 months ago Hyperlipidemia LDL goal <130   [facility] H[place] Register, MD   11 months ago Encounter to establish care   [facility] Claiborne Rigg, NP              Patient needs appointment in October for follow up.

## 2021-08-30 ENCOUNTER — Other Ambulatory Visit: Payer: Self-pay

## 2021-09-01 ENCOUNTER — Other Ambulatory Visit: Payer: Self-pay

## 2021-09-22 ENCOUNTER — Other Ambulatory Visit: Payer: Self-pay | Admitting: *Deleted

## 2021-09-22 DIAGNOSIS — I10 Essential (primary) hypertension: Secondary | ICD-10-CM

## 2021-09-22 DIAGNOSIS — E876 Hypokalemia: Secondary | ICD-10-CM

## 2021-09-22 DIAGNOSIS — I5022 Chronic systolic (congestive) heart failure: Secondary | ICD-10-CM

## 2021-09-22 DIAGNOSIS — T502X5A Adverse effect of carbonic-anhydrase inhibitors, benzothiadiazides and other diuretics, initial encounter: Secondary | ICD-10-CM

## 2021-09-22 NOTE — Telephone Encounter (Signed)
Requested medications are on the active medication list yes  Last visit 06/03/21  Future visit scheduled 11/23/21, was asked to return in October  Notes to clinic [facility] does not have a protocol to assess, return visit in Oct requested for other meds, no visit until Dec. Please assess.  Requested Prescriptions  Pending Prescriptions Disp Refills   dapagliflozin propanediol (FARXIGA) 5 MG TABS tablet 30 tablet 3    Sig: TAKE 1 TABLET (5 MG TOTAL) BY MOUTH DAILY BEFORE BREAKFAST.     Endocrinology:  Diabetes - SGLT2 Inhibitors Failed - 09/22/2021 11:55 AM      Failed - Cr in normal range and within 360 days    Creat  Date Value Ref Range Status  10/19/2016 1.32 0.60 - 1.35 mg/dL Final   Creatinine, Ser  Date Value Ref Range Status  07/11/2021 1.69 (H) 0.61 - 1.24 mg/dL Final   Creatinine, Urine  Date Value Ref Range Status  10/21/2014 240.9 mg/dL Final    Comment:    No reference range established. Performed at [facility]           Failed - AA eGFR in normal range and within 360 days    GFR calc Af Amer  Date Value Ref Range Status  08/12/2020 53 (L) >60 mL/min Final   GFR, Estimated  Date Value Ref Range Status  07/11/2021 52 (L) >60 mL/min Final    Comment:    (NOTE) Calculated using the CKD-EPI Creatinine Equation (2021)    GFR  Date Value Ref Range Status  08/21/2019 65.48 >60.00 mL/min Final   eGFR  Date Value Ref Range Status  06/07/2021 53 (L) >59 mL/min/1.73 Final          Passed - LDL in normal range and within 360 days    LDL Chol Calc (NIH)  Date Value Ref Range Status  06/07/2021 64 0 - 99 mg/dL Final   Direct LDL  Date Value Ref Range Status  08/21/2019 184.0 mg/dL Final    Comment:    Optimal:  <100 mg/dLNear or Above Optimal:  100-129 mg/dLBorderline High:  130-159 mg/dLHigh:  160-189 mg/dLVery High:  >190 mg/dL          Passed - HBA1C is between 0 and 7.9 and within 180 days    Hgb A1c MFr Bld  Date Value Ref Range Status   06/07/2021 5.9 (H) 4.8 - 5.6 % Final    Comment:             Prediabetes: 5.7 - 6.4          Diabetes: >6.4          Glycemic control for adults with diabetes: <7.0           Passed - Valid encounter within last 6 months    Recent Outpatient Visits           3 months ago Upper respiratory infection, viral   [facility] [place], Zelda W, NP   6 months ago Hyperlipidemia LDL goal <130   [facility] [place], Enobong, MD   1 year ago Encounter to establish care   [facility] [place], Zelda W, NP       Future Appointments             In 2 months [place], Zelda W, NP [facility]               furosemide (LASIX) 40 MG tablet 90 tablet 0    Sig: TAKE 1 TABLET (40 MG TOTAL) BY MOUTH DAILY.     Cardiovascular:  Diuretics - Loop Failed - 09/22/2021 11:55 AM      Failed - K in normal range and within 360 days    Potassium  Date Value Ref Range Status  07/11/2021 3.2 (L) 3.5 - 5.1 mmol/L Final          Failed - Cr in normal range and within 360 days    Creat  Date Value Ref Range Status  10/19/2016 1.32 0.60 - 1.35 mg/dL Final   Creatinine, Ser  Date Value Ref Range Status  07/11/2021 1.69 (H) 0.61 - 1.24 mg/dL Final   Creatinine, Urine  Date Value Ref Range Status  10/21/2014 240.9 mg/dL Final    Comment:    No reference range established. Performed at [facility]           Failed - Last BP in normal range    BP Readings from Last 1 Encounters:  07/11/21 (!) 205/156          Passed - Ca in normal range and within 360 days    Calcium  Date Value Ref Range Status  07/11/2021 9.2 8.9 - 10.3 mg/dL Final   Calcium, Ion  Date Value Ref Range Status  08/10/2020 1.23 1.15 - 1.40 mmol/L Final          Passed - Na in normal range and within 360 days    Sodium  Date Value Ref Range Status  07/11/2021 139 135 - 145 mmol/L Final   06/07/2021 139 134 - 144 mmol/L Final          Passed - Valid encounter within last 6 months    Recent Outpatient Visits           3 months ago Upper respiratory infection, viral   [facility] [place], Zelda W, NP   6 months ago Hyperlipidemia LDL goal <130   [facility] [place], En[place]ong, MD   1 year ago Encounter to establish care   [facility] [place], Zelda W, NP       Future Appointments             In 2 months [place], Zelda W, NP [facility]             spironolactone (ALDACTONE) 25 MG tablet 30 tablet 2    Sig: TAKE 1 TABLET (25 MG TOTAL) BY MOUTH DAILY.     Cardiovascular: Diuretics - Aldosterone Antagonist Failed - 09/22/2021 11:55 AM      Failed - Cr in normal range and within 360 days    Creat  Date Value Ref Range Status  10/19/2016 1.32 0.60 - 1.35 mg/dL Final   Creatinine, Ser  Date Value Ref Range Status  07/11/2021 1.69 (H) 0.61 - 1.24 mg/dL Final   Creatinine, Urine  Date Value Ref Range Status  10/21/2014 240.9 mg/dL Final    Comment:    No reference range established. Performed at [facility]           Failed - K in normal range and within 360 days    Potassium  Date Value Ref Range Status  07/11/2021 3.2 (L) 3.5 - 5.1 mmol/L Final          Failed - Last BP in normal range      BP Readings from Last 1 Encounters:  07/11/21 (!) 205/156          Passed - Na in normal range and within 360 days    Sodium  Date Value Ref Range Status  07/11/2021 139 135 - 145 mmol/L Final  06/07/2021 139 134 - 144 mmol/L Final          Passed - Valid encounter within last 6 months    Recent Outpatient Visits           3 months ago Upper respiratory infection, viral   [facility] [place], Zelda W, NP   6 months ago Hyperlipidemia LDL goal <130   [facility]  [place], Enobong, MD   1 year ago Encounter to establish care   [facility] [place], Zelda W, NP       Future Appointments             In 2 months [place], Zelda W, NP [facility]             carvedilol (COREG) 25 MG tablet 60 tablet 3    Sig: TAKE 1 TABLET (25 MG TOTAL) BY MOUTH 2 (TWO) TIMES DAILY.     Cardiovascular:  Beta Blockers Failed - 09/22/2021 11:55 AM      Failed - Last BP in normal range    BP Readings from Last 1 Encounters:  07/11/21 (!) 205/156          Passed - Last Heart Rate in normal range    Pulse Readings from Last 1 Encounters:  07/11/21 (!) 104          Passed - Valid encounter within last 6 months    Recent Outpatient Visits           3 months ago Upper respiratory infection, viral   [facility] [place], Zelda W, NP   6 months ago Hyperlipidemia LDL goal <130   [facility] [place], Enobong, MD   1 year ago Encounter to establish care   [facility] [place], Zelda W, NP       Future Appointments             In 2 months Fleming, Zelda W, NP  [facility]             isosorbide-hydrALAZINE (BIDIL) 20-37.5 MG tablet 180 tablet 0    Sig: TAKE 1 TABLET BY MOUTH 3 (THREE) TIMES DAILY.     Cardiovascular:  Vasodilators Failed - 09/22/2021 11:55 AM      Failed - HCT in normal range and within 360 days    HCT  Date Value Ref Range Status  07/11/2021 37.0 (L) 39.0 - 52.0 % Final   Hematocrit  Date Value Ref Range Status  06/07/2021 35.2 (L) 37.5 - 51.0 % Final          Failed - HGB in normal range and within 360 days    Hemoglobin  Date Value Ref Range Status  07/11/2021 11.1 (L) 13.0 - 17.0 g/dL Final  06/07/2021 11.3 (L) 13.0 - 17.7 g/dL Final          Failed - Last BP in normal range    BP Readings from Last 1 Encounters:  07/11/21 (!) 205/156           Passed - RBC in normal   range and within 360 days    RBC  Date Value Ref Range Status  07/11/2021 4.62 4.22 - 5.81 MIL/uL Final          Passed - WBC in normal range and within 360 days    WBC  Date Value Ref Range Status  07/11/2021 9.4 4.0 - 10.5 K/uL Final          Passed - PLT in normal range and within 360 days    Platelets  Date Value Ref Range Status  07/11/2021 366 150 - 400 K/uL Final  06/07/2021 396 150 - 450 x10E3/uL Final          Passed - Valid encounter within last 12 months    Recent Outpatient Visits           3 months ago Upper respiratory infection, viral   [place], Vernia Buff, NP   6 months ago Hyperlipidemia LDL goal <130   [place], MD   1 year ago Encounter to establish care   [place], NP       Future Appointments             In 2 months [name], NP [place] (ENTRESTO) 24-26 MG 60 tablet 2    Sig: TAKE 1 TABLET BY MOUTH 2 (TWO) TIMES DAILY.     Off-Protocol Failed - 09/22/2021 11:55 AM      Failed - Medication not assigned to a protocol, review manually.      Passed - Valid encounter within last 12 months    Recent Outpatient Visits           3 months ago Upper respiratory infection, viral   [place], NP   6 months ago Hyperlipidemia LDL goal <130   [place], MD   1 year ago Encounter to establish care   [place], Zelda W, NP       Future Appointments             In 2 months [name], NP [place]

## 2021-09-23 ENCOUNTER — Other Ambulatory Visit: Payer: Self-pay

## 2021-09-23 MED ORDER — SACUBITRIL-VALSARTAN 24-26 MG PO TABS
1.0000 | ORAL_TABLET | Freq: Two times a day (BID) | ORAL | 2 refills | Status: DC
  Filled 2021-09-23: qty 60, 30d supply, fill #0

## 2021-09-23 MED ORDER — FUROSEMIDE 40 MG PO TABS
ORAL_TABLET | Freq: Every day | ORAL | 0 refills | Status: DC
  Filled 2021-09-23: qty 30, 30d supply, fill #0
  Filled 2021-11-07: qty 30, 30d supply, fill #1
  Filled 2021-12-13: qty 30, 30d supply, fill #0

## 2021-09-23 MED ORDER — CARVEDILOL 25 MG PO TABS
ORAL_TABLET | Freq: Two times a day (BID) | ORAL | 2 refills | Status: DC
Start: 2021-09-23 — End: 2021-12-28
  Filled 2021-09-23: qty 60, 30d supply, fill #0

## 2021-09-23 MED ORDER — DAPAGLIFLOZIN PROPANEDIOL 5 MG PO TABS
ORAL_TABLET | Freq: Every day | ORAL | 2 refills | Status: DC
  Filled 2021-09-23: qty 30, 30d supply, fill #0
  Filled 2021-11-07: qty 30, 30d supply, fill #1
  Filled 2021-12-13: qty 30, 30d supply, fill #0

## 2021-09-23 MED ORDER — SPIRONOLACTONE 25 MG PO TABS
ORAL_TABLET | Freq: Every day | ORAL | 2 refills | Status: DC
  Filled 2021-09-23: qty 30, 30d supply, fill #0
  Filled 2021-11-07: qty 30, 30d supply, fill #1
  Filled 2021-12-13: qty 30, 30d supply, fill #0

## 2021-09-23 MED ORDER — ISOSORB DINITRATE-HYDRALAZINE 20-37.5 MG PO TABS
1.0000 | ORAL_TABLET | Freq: Three times a day (TID) | ORAL | 0 refills | Status: DC
Start: 2021-09-23 — End: 2021-12-28
  Filled 2021-09-23 – 2021-12-13 (×2): qty 90, 30d supply, fill #0

## 2021-09-26 ENCOUNTER — Other Ambulatory Visit: Payer: Self-pay

## 2021-11-07 ENCOUNTER — Other Ambulatory Visit: Payer: Self-pay

## 2021-11-23 ENCOUNTER — Ambulatory Visit: Payer: Self-pay | Admitting: Nurse Practitioner

## 2021-11-30 ENCOUNTER — Ambulatory Visit: Payer: Self-pay | Admitting: Physician Assistant

## 2021-12-12 ENCOUNTER — Other Ambulatory Visit: Payer: Self-pay

## 2021-12-13 ENCOUNTER — Other Ambulatory Visit: Payer: Self-pay

## 2021-12-14 ENCOUNTER — Other Ambulatory Visit: Payer: Self-pay

## 2021-12-28 ENCOUNTER — Other Ambulatory Visit: Payer: Self-pay

## 2021-12-28 ENCOUNTER — Encounter: Payer: Self-pay | Admitting: Physician Assistant

## 2021-12-28 ENCOUNTER — Ambulatory Visit: Payer: Self-pay | Attending: Nurse Practitioner | Admitting: Physician Assistant

## 2021-12-28 DIAGNOSIS — I10 Essential (primary) hypertension: Secondary | ICD-10-CM

## 2021-12-28 DIAGNOSIS — T502X5A Adverse effect of carbonic-anhydrase inhibitors, benzothiadiazides and other diuretics, initial encounter: Secondary | ICD-10-CM

## 2021-12-28 DIAGNOSIS — I5022 Chronic systolic (congestive) heart failure: Secondary | ICD-10-CM

## 2021-12-28 DIAGNOSIS — R739 Hyperglycemia, unspecified: Secondary | ICD-10-CM

## 2021-12-28 DIAGNOSIS — E876 Hypokalemia: Secondary | ICD-10-CM

## 2021-12-28 DIAGNOSIS — E785 Hyperlipidemia, unspecified: Secondary | ICD-10-CM

## 2021-12-28 MED ORDER — ISOSORB DINITRATE-HYDRALAZINE 20-37.5 MG PO TABS
1.0000 | ORAL_TABLET | Freq: Three times a day (TID) | ORAL | 0 refills | Status: DC
  Filled 2021-12-28: qty 180, 60d supply, fill #0
  Filled 2022-01-12: qty 90, 30d supply, fill #0
  Filled 2022-02-09: qty 90, 30d supply, fill #1

## 2021-12-28 MED ORDER — SACUBITRIL-VALSARTAN 24-26 MG PO TABS
1.0000 | ORAL_TABLET | Freq: Two times a day (BID) | ORAL | 2 refills | Status: DC
  Filled 2021-12-28 – 2022-01-12 (×2): qty 60, 30d supply, fill #0

## 2021-12-28 MED ORDER — ATORVASTATIN CALCIUM 40 MG PO TABS
ORAL_TABLET | Freq: Every day | ORAL | 3 refills | Status: DC
Start: 2021-12-28 — End: 2022-03-23
  Filled 2021-12-28: qty 90, fill #0
  Filled 2022-01-12: qty 30, 30d supply, fill #0
  Filled 2022-02-09: qty 30, 30d supply, fill #1

## 2021-12-28 MED ORDER — DAPAGLIFLOZIN PROPANEDIOL 5 MG PO TABS
ORAL_TABLET | Freq: Every day | ORAL | 2 refills | Status: DC
Start: 2021-12-28 — End: 2022-02-15
  Filled 2021-12-28: qty 30, fill #0
  Filled 2022-01-12: qty 30, 30d supply, fill #0
  Filled 2022-02-09: qty 30, 30d supply, fill #1

## 2021-12-28 MED ORDER — FUROSEMIDE 40 MG PO TABS
ORAL_TABLET | Freq: Every day | ORAL | 0 refills | Status: DC
  Filled 2021-12-28: qty 90, fill #0
  Filled 2022-01-12: qty 30, 30d supply, fill #0
  Filled 2022-02-09: qty 30, 30d supply, fill #1

## 2021-12-28 MED ORDER — CARVEDILOL 25 MG PO TABS
ORAL_TABLET | Freq: Two times a day (BID) | ORAL | 2 refills | Status: DC
  Filled 2021-12-28 – 2022-01-12 (×2): qty 60, 30d supply, fill #0
  Filled 2022-02-09: qty 60, 30d supply, fill #1

## 2021-12-28 MED ORDER — SPIRONOLACTONE 25 MG PO TABS
ORAL_TABLET | Freq: Every day | ORAL | 2 refills | Status: DC
  Filled 2021-12-28: qty 30, fill #0
  Filled 2022-01-12: qty 30, 30d supply, fill #0
  Filled 2022-02-09: qty 30, 30d supply, fill #1

## 2021-12-28 NOTE — Progress Notes (Signed)
Virtual Visit via Telephone Note  I connected with [name] on 12/28/21 at 10:30 AM EST by telephone and verified that I am speaking with the correct person using two identifiers.  Location: Patient: home Provider: my home office/workplace relocation week   I discussed the limitations, risks, security and privacy concerns of performing an evaluation and management service by telephone and the availability of in person appointments. I also discussed with the patient that there may be a patient responsible charge related to this service. The patient expressed understanding and agreed to proceed.   History of Present Illness: Mr Busic needs RF of meds.  No issus or concerns today.  Denies CP/SOB/HA/dizziness.     Observations/Objective:  NAD.  A&Ox3   Assessment and Plan: 1. Chronic systolic congestive heart failure (HCC) - carvedilol (COREG) 25 MG tablet; TAKE 1 TABLET (25 MG TOTAL) BY MOUTH 2 (TWO) TIMES DAILY.  Dispense: 60 tablet; Refill: 2 - sacubitril-valsartan (ENTRESTO) 24-26 MG; TAKE 1 TABLET BY MOUTH 2 (TWO) TIMES DAILY.  Dispense: 60 tablet; Refill: 2 - dapagliflozin propanediol (FARXIGA) 5 MG TABS tablet; TAKE 1 TABLET (5 MG TOTAL) BY MOUTH DAILY BEFORE BREAKFAST.  Dispense: 30 tablet; Refill: 2 - furosemide (LASIX) 40 MG tablet; TAKE 1 TABLET (40 MG TOTAL) BY MOUTH DAILY.  Dispense: 90 tablet; Refill: 0 - isosorbide-hydrALAZINE (BIDIL) 20-37.5 MG tablet; TAKE 1 TABLET BY MOUTH 3 (THREE) TIMES DAILY.  Dispense: 180 tablet; Refill: 0 - spironolactone (ALDACTONE) 25 MG tablet; TAKE 1 TABLET (25 MG TOTAL) BY MOUTH DAILY.  Dispense: 30 tablet; Refill: 2 - Comprehensive metabolic panel; Future - CBC with Differential/Platelet; Future  2. Essential hypertension, malignant - carvedilol (COREG) 25 MG tablet; TAKE 1 TABLET (25 MG TOTAL) BY MOUTH 2 (TWO) TIMES DAILY.  Dispense: 60 tablet; Refill: 2 - isosorbide-hydrALAZINE (BIDIL) 20-37.5 MG tablet; TAKE 1 TABLET BY MOUTH 3 (THREE)  TIMES DAILY.  Dispense: 180 tablet; Refill: 0 - spironolactone (ALDACTONE) 25 MG tablet; TAKE 1 TABLET (25 MG TOTAL) BY MOUTH DAILY.  Dispense: 30 tablet; Refill: 2 - Comprehensive metabolic panel; Future - CBC with Differential/Platelet; Future  3. Hyperlipidemia LDL goal <130  - atorvastatin (LIPITOR) 40 MG tablet; TAKE 1 TABLET (40 MG TOTAL) BY MOUTH DAILY.  Dispense: 90 tablet; Refill: 3 - Lipid panel; Future  4. Diuretic-induced hypokalemia - spironolactone (ALDACTONE) 25 MG tablet; TAKE 1 TABLET (25 MG TOTAL) BY MOUTH DAILY.  Dispense: 30 tablet; Refill: 2  5. Hyperglycemia I have had a lengthy discussion and provided education about insulin resistance and the intake of too much sugar/refined carbohydrates.  I have advised the patient to work at a goal of eliminating sugary drinks, candy, desserts, sweets, refined sugars, processed foods, and white carbohydrates.  The patient expresses understanding. - Hemoglobin A1c; Future  Follow Up Instructions: See PCP in 2 months   I discussed the assessment and treatment plan with the patient. The patient was provided an opportunity to ask questions and all were answered. The patient agreed with the plan and demonstrated an understanding of the instructions.   The patient was advised to call back or seek an in-person evaluation if the symptoms worsen or if the condition fails to improve as anticipated.  I provided 13 minutes of non-face-to-face time during this encounter.   Georgian Co, PA-C  Patient ID: Steve Carrillo, male   DOB: Mar 12, 1981, 41 y.o.   MRN: [id number]

## 2022-01-04 ENCOUNTER — Other Ambulatory Visit: Payer: Self-pay

## 2022-01-12 ENCOUNTER — Other Ambulatory Visit: Payer: Self-pay

## 2022-01-19 ENCOUNTER — Other Ambulatory Visit: Payer: Self-pay

## 2022-02-09 ENCOUNTER — Other Ambulatory Visit: Payer: Self-pay

## 2022-02-10 ENCOUNTER — Other Ambulatory Visit: Payer: Self-pay

## 2022-02-15 ENCOUNTER — Other Ambulatory Visit: Payer: Self-pay | Admitting: Nurse Practitioner

## 2022-02-15 DIAGNOSIS — T502X5A Adverse effect of carbonic-anhydrase inhibitors, benzothiadiazides and other diuretics, initial encounter: Secondary | ICD-10-CM

## 2022-02-15 DIAGNOSIS — I10 Essential (primary) hypertension: Secondary | ICD-10-CM

## 2022-02-15 DIAGNOSIS — I5022 Chronic systolic (congestive) heart failure: Secondary | ICD-10-CM

## 2022-02-15 MED ORDER — FUROSEMIDE 40 MG PO TABS: ORAL_TABLET | Freq: Every day | ORAL | 0 refills | Status: DC

## 2022-02-15 MED ORDER — DAPAGLIFLOZIN PROPANEDIOL 5 MG PO TABS: ORAL_TABLET | Freq: Every day | ORAL | 0 refills | Status: DC

## 2022-02-15 MED ORDER — ISOSORB DINITRATE-HYDRALAZINE 20-37.5 MG PO TABS: 1.0000 | ORAL_TABLET | Freq: Three times a day (TID) | ORAL | 0 refills | Status: DC

## 2022-02-15 MED ORDER — SPIRONOLACTONE 25 MG PO TABS: ORAL_TABLET | Freq: Every day | ORAL | 2 refills | Status: DC

## 2022-02-15 NOTE — Telephone Encounter (Signed)
Pharmacy called and stated he needed a profile transfer / he couldn't tell me what medications was needed and it looks like most recent meds were sent to them before/ he stated that the pt called and request other medications/ but was sure of why he was calling / please advise  ? ?[id number] [place], [place] - [phone] AT [facility] [address]  ?[address] [place] [address]  ?

## 2022-02-15 NOTE — Telephone Encounter (Signed)
[facility] called and spoke to Steve Carrillo, [facility] about the message below. He says he already explained to the patient that his insurance will not pay for Steve Carrillo, Isosorbide-Hydralazine (Bidil), Spironlactone, furosemide, patient was advised to call the insurance company to find a mail order pharmacy and then he will need to have those refills sent there. Patient called, left VM to return the call to the office. He will need to be notified of the above to call insurance provider for a list of mail order pharmacies, call the one he chooses to Steve Carrillo to set up a profile with that mail order pharmacy, then call the office back to request the refills of the above medications sent to the mail order.  ?

## 2022-02-15 NOTE — Telephone Encounter (Signed)
Pt called back and wanted all his medications sent to  ?[facility] ([facility] ) - [place], [place] - [phone]  ?[address] [place] [address]  ?Phone: [phone] Fax: [phone]  ? ?

## 2022-02-15 NOTE — Telephone Encounter (Signed)
Requested medication (s) are due for refill today: Yes  Requested medication (s) are on the active medication list: Yes  Last refill:  12/28/21  Future visit scheduled: Yes  Notes to clinic:  Unable to refill per protocol, medication not assigned to a protocol     Requested Prescriptions  Pending Prescriptions Disp Refills   sacubitril-valsartan (ENTRESTO) 24-26 MG 180 tablet 0    Sig: TAKE 1 TABLET BY MOUTH 2 (TWO) TIMES DAILY.     Off-Protocol Failed - 02/15/2022  6:00 PM      Failed - Medication not assigned to a protocol, review manually.      Passed - Valid encounter within last 12 months    Recent Outpatient Visits           1 month ago Hyperglycemia   [facility] [place], [place], [place]   8 months ago Upper respiratory infection, viral   [place] [address] [facility] [place], Shea Stakes, NP   11 months ago Hyperlipidemia LDL goal <130   [facility] Hoy Register, MD   1 year ago Encounter to establish care   [facility] Claiborne Rigg, NP       Future Appointments             In 3 weeks Claiborne Rigg, NP [facility]            Signed Prescriptions Disp Refills   dapagliflozin propanediol (FARXIGA) 5 MG TABS tablet 90 tablet 0    Sig: TAKE 1 TABLET (5 MG TOTAL) BY MOUTH DAILY BEFORE BREAKFAST.     Endocrinology:  Diabetes - SGLT2 Inhibitors Failed - 02/15/2022  6:00 PM      Failed - Cr in normal range and within 360 days    Creat  Date Value Ref Range Status  10/19/2016 1.32 0.60 - 1.35 mg/dL Final   Creatinine, Ser  Date Value Ref Range Status  07/11/2021 1.69 (H) 0.61 - 1.24 mg/dL Final   Creatinine, Urine  Date Value Ref Range Status  10/21/2014 240.9 mg/dL Final    Comment:    No reference range established. Performed at [facility]           Failed - HBA1C is between 0 and 7.9 and within 180  days    Hgb A1c MFr Bld  Date Value Ref Range Status  06/07/2021 5.9 (H) 4.8 - 5.6 % Final    Comment:             Prediabetes: 5.7 - 6.4          Diabetes: >6.4          Glycemic control for adults with diabetes: <7.0           Failed - eGFR in normal range and within 360 days    GFR calc Af Amer  Date Value Ref Range Status  08/12/2020 53 (L) >60 mL/min Final   GFR, Estimated  Date Value Ref Range Status  07/11/2021 52 (L) >60 mL/min Final    Comment:    (NOTE) Calculated using the CKD-EPI Creatinine Equation (2021)    GFR  Date Value Ref Range Status  08/21/2019 65.48 >60.00 mL/min Final   eGFR  Date Value Ref Range Status  06/07/2021 53 (L) >59 mL/min/1.73 Final          Failed - Valid encounter within last 6 months  Recent Outpatient Visits           1 month ago Hyperglycemia   [facility] [place], [place], [place]   8 months ago Upper respiratory infection, viral   [place] [address] And Well[facility] [place], [place] W, NP   11 months ago Hyperlipidemia LDL goal <130   [facility] Hoy Register, MD   1 year ago Encounter to establish care   [facility] Claiborne Rigg, NP       Future Appointments             In 3 weeks Claiborne Rigg, NP [facility]             furosemide (LASIX) 40 MG tablet 90 tablet 0    Sig: TAKE 1 TABLET (40 MG TOTAL) BY MOUTH DAILY.     Cardiovascular:  Diuretics - Loop Failed - 02/15/2022  6:00 PM      Failed - K in normal range and within 180 days    Potassium  Date Value Ref Range Status  07/11/2021 3.2 (L) 3.5 - 5.1 mmol/L Final          Failed - Ca in normal range and within 180 days    Calcium  Date Value Ref Range Status  07/11/2021 9.2 8.9 - 10.3 mg/dL Final   Calcium, Ion  Date Value Ref Range Status  08/10/2020 1.23 1.15 - 1.40 mmol/L Final          Failed - Na in  normal range and within 180 days    Sodium  Date Value Ref Range Status  07/11/2021 139 135 - 145 mmol/L Final  06/07/2021 139 134 - 144 mmol/L Final          Failed - Cr in normal range and within 180 days    Creat  Date Value Ref Range Status  10/19/2016 1.32 0.60 - 1.35 mg/dL Final   Creatinine, Ser  Date Value Ref Range Status  07/11/2021 1.69 (H) 0.61 - 1.24 mg/dL Final   Creatinine, Urine  Date Value Ref Range Status  10/21/2014 240.9 mg/dL Final    Comment:    No reference range established. Performed at [facility]           Failed - Cl in normal range and within 180 days    Chloride  Date Value Ref Range Status  07/11/2021 104 98 - 111 mmol/L Final          Failed - Mg Level in normal range and within 180 days    Magnesium  Date Value Ref Range Status  08/12/2020 2.3 1.7 - 2.4 mg/dL Final    Comment:    Performed at [facility], [address] [place], [place] [address]          Failed - Last BP in normal range    BP Readings from Last 1 Encounters:  07/11/21 (!) 205/156          Failed - Valid encounter within last 6 months    Recent Outpatient Visits           1 month ago Hyperglycemia   [facility] [place], [place], [place]   8 months ago Upper respiratory infection, viral   [facility] [place], [place] W, NP   11 months ago Hyperlipidemia LDL goal <130  [facility] Hoy Register, MD   1 year ago Encounter to establish care   [facility] Claiborne Rigg, NP       Future Appointments             In 3 weeks Claiborne Rigg, NP [place] [facility]             isosorbide-hydrALAZINE (BIDIL) 20-37.5 MG tablet 270 tablet 0    Sig: TAKE 1 TABLET BY MOUTH 3 (THREE) TIMES DAILY.     Cardiovascular:  Vasodilators Failed - 02/15/2022  6:00 PM      Failed - HCT in  normal range and within 360 days    HCT  Date Value Ref Range Status  07/11/2021 37.0 (L) 39.0 - 52.0 % Final   Hematocrit  Date Value Ref Range Status  06/07/2021 35.2 (L) 37.5 - 51.0 % Final          Failed - HGB in normal range and within 360 days    Hemoglobin  Date Value Ref Range Status  07/11/2021 11.1 (L) 13.0 - 17.0 g/dL Final  28/41/3244 01.0 (L) 13.0 - 17.7 g/dL Final          Failed - ANA Screen, Ifa, Serum in normal range and within 360 days    No results found for: ANA, ANATITER, LABANTI        Failed - Last BP in normal range    BP Readings from Last 1 Encounters:  07/11/21 (!) 205/156          Passed - RBC in normal range and within 360 days    RBC  Date Value Ref Range Status  07/11/2021 4.62 4.22 - 5.81 MIL/uL Final          Passed - WBC in normal range and within 360 days    WBC  Date Value Ref Range Status  07/11/2021 9.4 4.0 - 10.5 K/uL Final          Passed - PLT in normal range and within 360 days    Platelets  Date Value Ref Range Status  07/11/2021 366 150 - 400 K/uL Final  06/07/2021 396 150 - 450 x10E3/uL Final          Passed - Valid encounter within last 12 months    Recent Outpatient Visits           1 month ago Hyperglycemia   [facility] [place], [place], [place]   8 months ago Upper respiratory infection, viral   [place] [address] [facility] Wellness [place], [place] W, NP   11 months ago Hyperlipidemia LDL goal <130   [facility] Wellness Hoy Register, MD   1 year ago Encounter to establish care   [facility] [place], Shea Stakes, NP       Future Appointments             In 3 weeks Claiborne Rigg, NP [place] [facility]             spironolactone (ALDACTONE) 25 MG tablet 30 tablet 2    Sig: TAKE 1 TABLET (25 MG TOTAL) BY MOUTH DAILY.     Cardiovascular: Diuretics - Aldosterone Antagonist Failed -  02/15/2022  6:00 PM      Failed - Cr in normal range and within 180 days    Creat  Date Value Ref Range Status  10/19/2016 1.32 0.60 - 1.35 mg/dL Final   Creatinine, Ser  Date Value Ref Range Status  07/11/2021 1.69 (H) 0.61 - 1.24 mg/dL Final   Creatinine, Urine  Date Value Ref Range Status  10/21/2014 240.9 mg/dL Final    Comment:    No reference range established. Performed at [facility]           Failed - K in normal range and within 180 days    Potassium  Date Value Ref Range Status  07/11/2021 3.2 (L) 3.5 - 5.1 mmol/L Final          Failed - Na in normal range and within 180 days    Sodium  Date Value Ref Range Status  07/11/2021 139 135 - 145 mmol/L Final  06/07/2021 139 134 - 144 mmol/L Final          Failed - eGFR is 30 or above and within 180 days    GFR calc Af Amer  Date Value Ref Range Status  08/12/2020 53 (L) >60 mL/min Final   GFR, Estimated  Date Value Ref Range Status  07/11/2021 52 (L) >60 mL/min Final    Comment:    (NOTE) Calculated using the CKD-EPI Creatinine Equation (2021)    GFR  Date Value Ref Range Status  08/21/2019 65.48 >60.00 mL/min Final   eGFR  Date Value Ref Range Status  06/07/2021 53 (L) >59 mL/min/1.73 Final          Failed - Last BP in normal range    BP Readings from Last 1 Encounters:  07/11/21 (!) 205/156          Failed - Valid encounter within last 6 months    Recent Outpatient Visits           1 month ago Hyperglycemia   [facility] [place], [place], [place]   8 months ago Upper respiratory infection, viral   [place] [address] And Wellness [place], Shea Stakes, NP   11 months ago Hyperlipidemia LDL goal <130   [employer] [facility] Wellness Hoy Register, MD   1 year ago Encounter to establish care   [facility] Claiborne Rigg, NP       Future Appointments             In 3 weeks Claiborne Rigg, NP  [facility]ness               c

## 2022-02-15 NOTE — Telephone Encounter (Signed)
Requested Prescriptions  ?Pending Prescriptions Disp Refills  ?? dapagliflozin propanediol (FARXIGA) 5 MG TABS tablet 90 tablet 0  ?  Sig: TAKE 1 TABLET (5 MG TOTAL) BY MOUTH DAILY BEFORE BREAKFAST.  ?  ? Endocrinology:  Diabetes - SGLT2 Inhibitors Failed - 02/15/2022  6:00 PM  ?  ?  Failed - Cr in normal range and within 360 days  ?  Creat  ?Date Value Ref Range Status  ?10/19/2016 1.32 0.60 - 1.35 mg/dL Final  ? ?Creatinine, Ser  ?Date Value Ref Range Status  ?07/11/2021 1.69 (H) 0.61 - 1.24 mg/dL Final  ? ?Creatinine, Urine  ?Date Value Ref Range Status  ?10/21/2014 240.9 mg/dL Final  ?  Comment:  ?  No reference range established. ?Performed at [facility] ?  ?   ?  ?  Failed - HBA1C is between 0 and 7.9 and within 180 days  ?  Hgb A1c MFr Bld  ?Date Value Ref Range Status  ?06/07/2021 5.9 (H) 4.8 - 5.6 % Final  ?  Comment:  ?           Prediabetes: 5.7 - 6.4 ?         Diabetes: >6.4 ?         Glycemic control for adults with diabetes: <7.0 ?  ?   ?  ?  Failed - eGFR in normal range and within 360 days  ?  GFR calc Af Amer  ?Date Value Ref Range Status  ?08/12/2020 53 (L) >60 mL/min Final  ? ?GFR, Estimated  ?Date Value Ref Range Status  ?07/11/2021 52 (L) >60 mL/min Final  ?  Comment:  ?  (NOTE) ?Calculated using the CKD-EPI Creatinine Equation (2021) ?  ? ?GFR  ?Date Value Ref Range Status  ?08/21/2019 65.48 >60.00 mL/min Final  ? ?eGFR  ?Date Value Ref Range Status  ?06/07/2021 53 (L) >59 mL/min/1.73 Final  ?   ?  ?  Failed - Valid encounter within last 6 months  ?  Recent Outpatient Visits   ?      ? 1 month ago Hyperglycemia  ? [place], [place], [place]  ? 8 months ago Upper respiratory infection, viral  ? [place], NP  ? 11 months ago Hyperlipidemia LDL goal <130  ? [facility] Charlott Rakes, MD  ? 1 year ago Encounter to establish care  ? [place] [name], NP  ?  ?  ?Future Appointments   ?        ? In 3 weeks [name], NP [place]  ?  ? ?  ?  ?  ?? furosemide (LASIX) 40 MG tablet 90 tablet 0  ?  Sig: TAKE 1 TABLET (40 MG TOTAL) BY MOUTH DAILY.  ?  ? Cardiovascular:  Diuretics - Loop Failed - 02/15/2022  6:00 PM  ?  ?  Failed - K in normal range and within 180 days  ?  Potassium  ?Date Value Ref Range Status  ?07/11/2021 3.2 (L) 3.5 - 5.1 mmol/L Final  ?   ?  ?  Failed - Ca in normal range and within 180 days  ?  Calcium  ?Date Value Ref Range Status  ?07/11/2021 9.2 8.9 - 10.3 mg/dL Final  ? ?Calcium, Ion  ?Date Value Ref Range Status  ?08/10/2020 1.23 1.15 - 1.40  mmol/L Final  ?   ?  ?  Failed - Na in normal range and within 180 days  ?  Sodium  ?Date Value Ref Range Status  ?07/11/2021 139 135 - 145 mmol/L Final  ?06/07/2021 139 134 - 144 mmol/L Final  ?   ?  ?  Failed - Cr in normal range and within 180 days  ?  Creat  ?Date Value Ref Range Status  ?10/19/2016 1.32 0.60 - 1.35 mg/dL Final  ? ?Creatinine, Ser  ?Date Value Ref Range Status  ?07/11/2021 1.69 (H) 0.61 - 1.24 mg/dL Final  ? ?Creatinine, Urine  ?Date Value Ref Range Status  ?10/21/2014 240.9 mg/dL Final  ?  Comment:  ?  No reference range established. ?Performed at [facility] ?  ?   ?  ?  Failed - Cl in normal range and within 180 days  ?  Chloride  ?Date Value Ref Range Status  ?07/11/2021 104 98 - 111 mmol/L Final  ?   ?  ?  Failed - Mg Level in normal range and within 180 days  ?  Magnesium  ?Date Value Ref Range Status  ?08/12/2020 2.3 1.7 - 2.4 mg/dL Final  ?  Comment:  ?  Performed at [facility], [place] [address] [place], [place] [address]  ?   ?  ?  Failed - Last BP in normal range  ?  BP Readings from Last 1 Encounters:  ?07/11/21 (!) 205/156  ?   ?  ?  Failed - Valid encounter within last 6 months  ?  Recent Outpatient Visits   ?      ? 1 month ago Hyperglycemia  ? [place], [place], [place]  ? 8 months ago Upper respiratory infection, viral  ? [place], NP  ? 11 months ago Hyperlipidemia LDL goal <130  ? [facility] Charlott Rakes, MD  ? 1 year ago Encounter to establish care  ? [employer] [name], NP  ?  ?  ?Future Appointments   ?        ? In 3 weeks [name], NP [place]  ?  ? ?  ?  ?  ?? isosorbide-hydrALAZINE (BIDIL) 20-37.5 MG tablet 270 tablet 0  ?  Sig: TAKE 1 TABLET BY MOUTH 3 (THREE) TIMES DAILY.  ?  ? Cardiovascular:  Vasodilators Failed - 02/15/2022  6:00 PM  ?  ?  Failed - HCT in normal range and within 360 days  ?  HCT  ?Date Value Ref Range Status  ?07/11/2021 37.0 (L) 39.0 - 52.0 % Final  ? ?Hematocrit  ?Date Value Ref Range Status  ?06/07/2021 35.2 (L) 37.5 - 51.0 % Final  ?   ?  ?  Failed - HGB in normal range and within 360 days  ?  Hemoglobin  ?Date Value Ref Range Status  ?07/11/2021 11.1 (L) 13.0 - 17.0 g/dL Final  ?06/07/2021 11.3 (L) 13.0 - 17.7 g/dL Final  ?   ?  ?  Failed - ANA Screen, Ifa, Serum in normal range and within 360 days  ?  No results found for: ANA, ANATITER, LABANTI   ?  ?  Failed - Last BP in normal range  ?  BP Readings from Last 1 Encounters:  ?07/11/21 (!) 205/156  ?   ?  ?  Passed - RBC in normal range and within 360 days  ?  RBC  ?Date Value Ref Range Status  ?07/11/2021 4.62 4.22 - 5.81 MIL/uL Final  ?   ?  ?  Passed - WBC in normal range and within 360 days  ?  WBC  ?Date Value Ref Range Status  ?07/11/2021 9.4 4.0 - 10.5 K/uL Final  ?   ?  ?  Passed - PLT in normal range and within 360 days  ?  Platelets  ?Date Value Ref Range Status  ?07/11/2021 366 150 - 400 K/uL Final  ?06/07/2021 396 150 - 450 x10E3/uL Final  ?   ?  ?  Passed - Valid encounter within last 12 months  ?  Recent Outpatient Visits   ?      ? 1 month ago Hyperglycemia  ? [place], [place], [place]  ? 8 months ago Upper respiratory infection, viral  ? [place], NP  ? 11 months ago Hyperlipidemia LDL goal <130  ? [facility] Charlott Rakes, MD  ? 1 year ago Encounter to establish care  ? [place] [name], NP  ?  ?  ?Future Appointments   ?        ? In 3 weeks [name], NP [place]  ?  ? ?  ?  ?  ?? sacubitril-valsartan (ENTRESTO) 24-26 MG 180 tablet 0  ?  Sig: TAKE 1 TABLET BY MOUTH 2 (TWO) TIMES DAILY.  ?  ? Off-Protocol Failed - 02/15/2022  6:00 PM  ?  ?  Failed - Medication not assigned to a protocol, review manually.  ?  ?  Passed - Valid encounter within last 12 months  ?  Recent Outpatient Visits   ?      ? 1 month ago Hyperglycemia  ? [place], [place], [place]  ? 8 months ago Upper respiratory infection, viral  ? [place], NP  ? 11 months ago Hyperlipidemia LDL goal <130  ? [facility] Charlott Rakes, MD  ? 1 year ago Encounter to establish care  ? [place] [name], NP  ?  ?  ?Future Appointments   ?        ? In 3 weeks [name], NP [place]  ?  ? ?  ?  ?  ?? spironolactone (ALDACTONE) 25 MG tablet 30 tablet 2  ?  Sig: TAKE 1 TABLET (25 MG TOTAL) BY MOUTH DAILY.  ?  ? Cardiovascular: Diuretics - Aldosterone Antagonist Failed - 02/15/2022  6:00 PM  ?  ?  Failed - Cr in normal range and within 180 days  ?  Creat  ?Date Value Ref Range Status  ?10/19/2016 1.32 0.60 - 1.35 mg/dL Final  ? ?Creatinine, Ser  ?Date Value Ref Range Status  ?07/11/2021 1.69 (H) 0.61 - 1.24 mg/dL Final  ? ?Creatinine, Urine  ?Date Value Ref Range Status  ?10/21/2014 240.9 mg/dL Final  ?  Comment:  ?  No reference range  established. ?Performed at [facility] ?  ?   ?  ?  Failed - K in normal range and within 180 days  ?  Potassium  ?Date Value  Ref Range Status  ?07/11/2021 3.2 (L) 3.5 - 5.1 mmol/L Final  ?   ?  ?  Failed - Na in

## 2022-02-15 NOTE — Addendum Note (Signed)
Addended by: Wilford Corner on: 02/15/2022 06:00 PM ? ? Modules accepted: Orders ? ?

## 2022-02-15 NOTE — Telephone Encounter (Signed)
Patient called and advised of the [facility]. He says he already has the mail order pharmacy written down in his wallet in the truck, but thinks it's [facility]. I advised to call back to let us know and we will send the medications listed to the pharmacy. ? ? ?

## 2022-02-16 MED ORDER — SACUBITRIL-VALSARTAN 24-26 MG PO TABS: 1.0000 | ORAL_TABLET | Freq: Two times a day (BID) | ORAL | 0 refills | Status: DC

## 2022-02-16 NOTE — Addendum Note (Signed)
Addended by: Daisy Blossom, Annie Main L on: 02/16/2022 12:07 PM ? ? Modules accepted: Orders ? ?

## 2022-03-13 ENCOUNTER — Ambulatory Visit: Payer: Self-pay | Admitting: Nurse Practitioner

## 2022-03-14 IMAGING — DX DG CHEST 1V PORT
1 series · 1 of 1 positions shown · non-contrast
Comparison: Radiograph and CT 08/06/2020

CLINICAL DATA: Cough and shortness of breath. Upper respiratory
symptoms.

EXAM:
PORTABLE CHEST 1 VIEW

[chest ap]
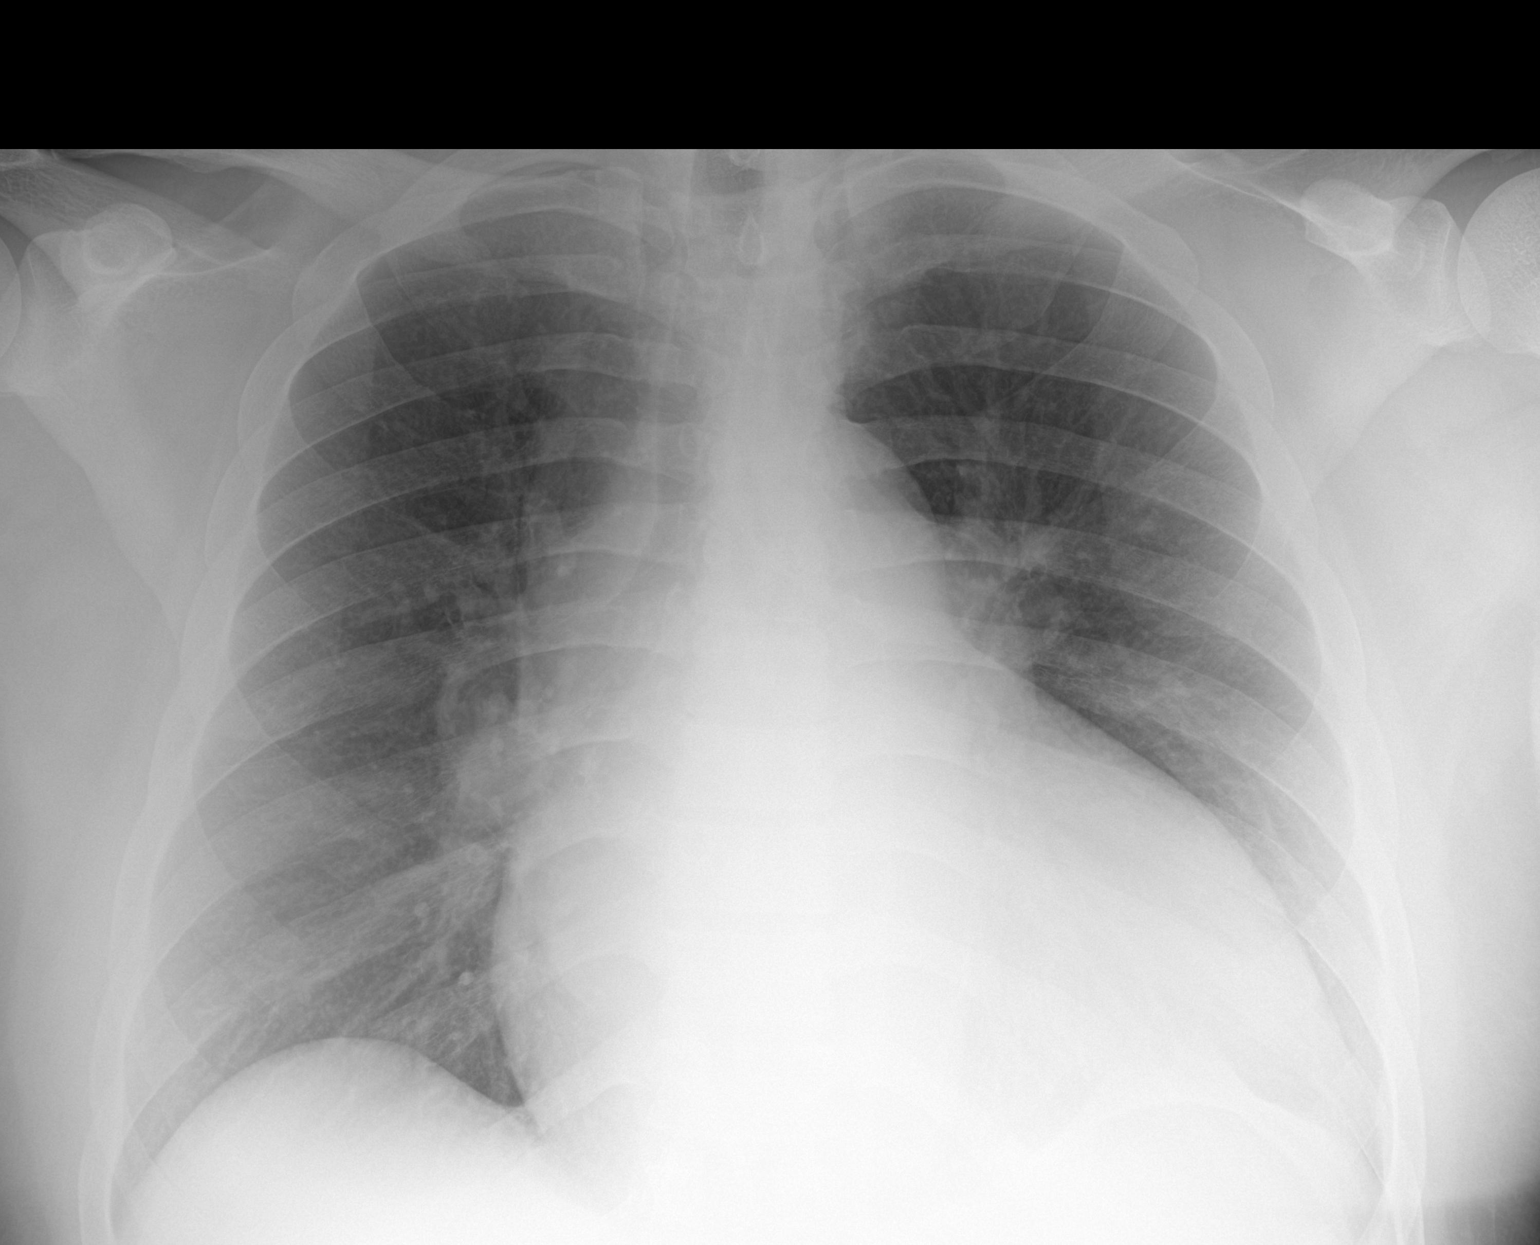

[1 of 1 positions shown; findings below may reference images not displayed]

FINDINGS: Prominent cardiomegaly, but stable from prior exam. Improved
pulmonary edema. There may be minimal vascular congestion. Suspected
small pleural effusions no confluent airspace disease. No
pneumothorax. Stable osseous structures.
IMPRESSION: 1. Cardiomegaly, stable from prior exam. Improved pulmonary edema
from prior, with mild vascular congestion.
2. Suspected small pleural effusions.

## 2022-03-23 ENCOUNTER — Other Ambulatory Visit: Payer: Self-pay | Admitting: Nurse Practitioner

## 2022-03-23 DIAGNOSIS — I10 Essential (primary) hypertension: Secondary | ICD-10-CM

## 2022-03-23 DIAGNOSIS — I5022 Chronic systolic (congestive) heart failure: Secondary | ICD-10-CM

## 2022-03-23 DIAGNOSIS — E785 Hyperlipidemia, unspecified: Secondary | ICD-10-CM

## 2022-03-23 NOTE — Telephone Encounter (Signed)
Medication Refill - Medication:  ?dapagliflozin propanediol (FARXIGA) 5 MG TABS tablet ?atorvastatin (LIPITOR) 40 MG tablet  ?carvedilol (COREG) 25 MG tablet  ? ?Has the patient contacted their pharmacy? Yes.   ?Contact PCP ? ?Preferred Pharmacy (with phone number or street name):  ?[id number] G[place] Otto, [place] - [phone] Phone:  [phone]  ?Fax:  [phone]  ?  ? ? ?Has the patient been seen for an appointment in the last year OR does the patient have an upcoming appointment? Yes.   ? ?Agent: Please be advised that RX refills may take up to 3 business days. We ask that you follow-up with your pharmacy. ?

## 2022-03-24 MED ORDER — DAPAGLIFLOZIN PROPANEDIOL 5 MG PO TABS: ORAL_TABLET | Freq: Every day | ORAL | 0 refills | Status: DC

## 2022-03-24 MED ORDER — CARVEDILOL 25 MG PO TABS: ORAL_TABLET | Freq: Two times a day (BID) | ORAL | 0 refills | Status: DC

## 2022-03-24 MED ORDER — ATORVASTATIN CALCIUM 40 MG PO TABS: ORAL_TABLET | Freq: Every day | ORAL | 0 refills | Status: DC

## 2022-03-24 NOTE — Telephone Encounter (Signed)
Requested Prescriptions  ?Pending Prescriptions Disp Refills  ?? atorvastatin (LIPITOR) 40 MG tablet 90 tablet 3  ?  Sig: TAKE 1 TABLET (40 MG TOTAL) BY MOUTH DAILY.  ?  ? Cardiovascular:  Antilipid - Statins Failed - 03/23/2022  3:13 PM  ?  ?  Failed - Lipid Panel in normal range within the last 12 months  ?  Cholesterol, Total  ?Date Value Ref Range Status  ?06/07/2021 119 100 - 199 mg/dL Final  ? ?LDL Chol Calc (NIH)  ?Date Value Ref Range Status  ?06/07/2021 64 0 - 99 mg/dL Final  ? ?Direct LDL  ?Date Value Ref Range Status  ?08/21/2019 184.0 mg/dL Final  ?  Comment:  ?  Optimal:  <100 mg/dLNear or Above Optimal:  100-129 mg/dLBorderline High:  130-159 mg/dLHigh:  160-189 mg/dLVery High:  >190 mg/dL  ? ?HDL  ?Date Value Ref Range Status  ?06/07/2021 39 (L) >39 mg/dL Final  ? ?Triglycerides  ?Date Value Ref Range Status  ?06/07/2021 79 0 - 149 mg/dL Final  ? ?  ?  ?  Passed - Patient is not pregnant  ?  ?  Passed - Valid encounter within last 12 months  ?  Recent Outpatient Visits   ?      ? 2 months ago Hyperglycemia  ? [place], [place], [place]  ? 9 months ago Upper respiratory infection, viral  ? [place], [place] W, NP  ? 1 year ago Hyperlipidemia LDL goal <130  ? [facility] Charlott Rakes, MD  ? 1 year ago Encounter to establish care  ? [place] [name], NP  ?  ?  ?Future Appointments   ?        ? In 2 months [name], NP [place]  ?  ? ?  ?  ?  ?? dapagliflozin propanediol (FARXIGA) 5 MG TABS tablet 90 tablet 0  ?  Sig: TAKE 1 TABLET (5 MG TOTAL) BY MOUTH DAILY BEFORE BREAKFAST.  ?  ? Endocrinology:  Diabetes - SGLT2 Inhibitors Failed - 03/23/2022  3:13 PM  ?  ?  Failed - Cr in normal range and within 360 days  ?  Creat  ?Date Value Ref Range Status  ?10/19/2016 1.32 0.60 - 1.35 mg/dL Final  ? ?Creatinine, Ser  ?Date  Value Ref Range Status  ?07/11/2021 1.69 (H) 0.61 - 1.24 mg/dL Final  ? ?Creatinine, Urine  ?Date Value Ref Range Status  ?10/21/2014 240.9 mg/dL Final  ?  Comment:  ?  No reference range established. ?Performed at [facility] ?  ?   ?  ?  Failed - HBA1C is between 0 and 7.9 and within 180 days  ?  Hgb A1c MFr Bld  ?Date Value Ref Range Status  ?06/07/2021 5.9 (H) 4.8 - 5.6 % Final  ?  Comment:  ?           Prediabetes: 5.7 - 6.4 ?         Diabetes: >6.4 ?         Glycemic control for adults with diabetes: <7.0 ?  ?   ?  ?  Failed - eGFR in normal range and within 360 days  ?  GFR calc Af Amer  ?Date Value Ref Range Status  ?08/12/2020 53 (L) >60 mL/min Final  ? ?GFR, Estimated  ?Date Value  Ref Range Status  ?07/11/2021 52 (L) >60 mL/min Final  ?  Comment:  ?  (NOTE) ?Calculated using the CKD-EPI Creatinine Equation (2021) ?  ? ?GFR  ?Date Value Ref Range Status  ?08/21/2019 65.48 >60.00 mL/min Final  ? ?eGFR  ?Date Value Ref Range Status  ?06/07/2021 53 (L) >59 mL/min/1.73 Final  ?   ?  ?  Failed - Valid encounter within last 6 months  ?  Recent Outpatient Visits   ?      ? 2 months ago Hyperglycemia  ? [place], [place], [place]  ? 9 months ago Upper respiratory infection, viral  ? [place], [place] W, NP  ? 1 year ago Hyperlipidemia LDL goal <130  ? [facility] Charlott Rakes, MD  ? 1 year ago Encounter to establish care  ? [place] [name], NP  ?  ?  ?Future Appointments   ?        ? In 2 months [name], NP [place]  ?  ? ?  ?  ?  ?? carvedilol (COREG) 25 MG tablet 60 tablet 2  ?  Sig: TAKE 1 TABLET (25 MG TOTAL) BY MOUTH 2 (TWO) TIMES DAILY.  ?  ? Cardiovascular: Beta Blockers 3 Failed - 03/23/2022  3:13 PM  ?  ?  Failed - Cr in normal range and within 360 days  ?  Creat  ?Date Value Ref Range Status  ?10/19/2016  1.32 0.60 - 1.35 mg/dL Final  ? ?Creatinine, Ser  ?Date Value Ref Range Status  ?07/11/2021 1.69 (H) 0.61 - 1.24 mg/dL Final  ? ?Creatinine, Urine  ?Date Value Ref Range Status  ?10/21/2014 240.9 mg/dL Final  ?  Comment:  ?  No reference range established. ?Performed at [facility] ?  ?   ?  ?  Failed - Last BP in normal range  ?  BP Readings from Last 1 Encounters:  ?07/11/21 (!) 205/156  ?   ?  ?  Failed - Valid encounter within last 6 months  ?  Recent Outpatient Visits   ?      ? 2 months ago Hyperglycemia  ? [place], [place], [place]  ? 9 months ago Upper respiratory infection, viral  ? [place], [place] W, NP  ? 1 year ago Hyperlipidemia LDL goal <130  ? [facility] Charlott Rakes, MD  ? 1 year ago Encounter to establish care  ? [place] [name], NP  ?  ?  ?Future Appointments   ?        ? In 2 months [name], NP [place]  ?  ? ?  ?  ?  Passed - AST in normal range and within 360 days  ?  AST  ?Date Value Ref Range Status  ?07/11/2021 16 15 - 41 U/L Final  ?   ?  ?  Passed - ALT in normal range and within 360 days  ?  ALT  ?Date Value Ref Range Status  ?07/11/2021 19 0 - 44 U/L Final  ?   ?  ?  Passed - Last Heart Rate in normal range  ?  Pulse Readings from Last 1 Encounters:  ?07/11/21 Mar[place] Kitchen)  104  ?   ?  ?  ? ? ?

## 2022-03-24 NOTE — Telephone Encounter (Signed)
Requested medication (s) are due for refill today: no ? ?Requested medication (s) are on the active medication list: yes ? ?Last refill:  12/28/21 and 02/15/22 ? ?Future visit scheduled: yes ? ?Notes to clinic: Refill request for atorvastatin is too soon, last refill was 12/28/21 for 90 days and 3 refills. Refill request for farxiga is too soon. Last refill 02/15/22 for 90 days. ? ? ?  ?Requested Prescriptions  ?Pending Prescriptions Disp Refills  ? atorvastatin (LIPITOR) 40 MG tablet 90 tablet 3  ?  Sig: TAKE 1 TABLET (40 MG TOTAL) BY MOUTH DAILY.  ?  ? Cardiovascular:  Antilipid - Statins Failed - 03/23/2022  3:13 PM  ?  ?  Failed - Lipid Panel in normal range within the last 12 months  ?  Cholesterol, Total  ?Date Value Ref Range Status  ?06/07/2021 119 100 - 199 mg/dL Final  ? ?LDL Chol Calc (NIH)  ?Date Value Ref Range Status  ?06/07/2021 64 0 - 99 mg/dL Final  ? ?Direct LDL  ?Date Value Ref Range Status  ?08/21/2019 184.0 mg/dL Final  ?  Comment:  ?  Optimal:  <100 mg/dLNear or Above Optimal:  100-129 mg/dLBorderline High:  130-159 mg/dLHigh:  160-189 mg/dLVery High:  >190 mg/dL  ? ?HDL  ?Date Value Ref Range Status  ?06/07/2021 39 (L) >39 mg/dL Final  ? ?Triglycerides  ?Date Value Ref Range Status  ?06/07/2021 79 0 - 149 mg/dL Final  ? ?  ?  ?  Passed - Patient is not pregnant  ?  ?  Passed - Valid encounter within last 12 months  ?  Recent Outpatient Visits   ? ?      ? 2 months ago Hyperglycemia  ? [place], [place], [place]  ? 9 months ago Upper respiratory infection, viral  ? [place], [place] W, NP  ? 1 year ago Hyperlipidemia LDL goal <130  ? [facility] Charlott Rakes, MD  ? 1 year ago Encounter to establish care  ? [place] [name], NP  ? ?  ?  ?Future Appointments   ? ?        ? In 2 months [name], NP [place]  ? ?   ? ? ?  ?  ?  ? dapagliflozin propanediol (FARXIGA) 5 MG TABS tablet 90 tablet 0  ?  Sig: TAKE 1 TABLET (5 MG TOTAL) BY MOUTH DAILY BEFORE BREAKFAST.  ?  ? Endocrinology:  Diabetes - SGLT2 Inhibitors Failed - 03/23/2022  3:13 PM  ?  ?  Failed - Cr in normal range and within 360 days  ?  Creat  ?Date Value Ref Range Status  ?10/19/2016 1.32 0.60 - 1.35 mg/dL Final  ? ?Creatinine, Ser  ?Date Value Ref Range Status  ?07/11/2021 1.69 (H) 0.61 - 1.24 mg/dL Final  ? ?Creatinine, Urine  ?Date Value Ref Range Status  ?10/21/2014 240.9 mg/dL Final  ?  Comment:  ?  No reference range established. ?Performed at [facility] ?  ?  ?  ?  ?  Failed - HBA1C is between 0 and 7.9 and within 180 days  ?  Hgb A1c MFr Bld  ?Date Value Ref Range Status  ?06/07/2021 5.9 (H) 4.8 - 5.6 % Final  ?  Comment:  ?           Prediabetes:  5.7 - 6.4 ?         Diabetes: >6.4 ?         Glycemic control for adults with diabetes: <7.0 ?  ?  ?  ?  ?  Failed - eGFR in normal range and within 360 days  ?  GFR calc Af Amer  ?Date Value Ref Range Status  ?08/12/2020 53 (L) >60 mL/min Final  ? ?GFR, Estimated  ?Date Value Ref Range Status  ?07/11/2021 52 (L) >60 mL/min Final  ?  Comment:  ?  (NOTE) ?Calculated using the CKD-EPI Creatinine Equation (2021) ?  ? ?GFR  ?Date Value Ref Range Status  ?08/21/2019 65.48 >60.00 mL/min Final  ? ?eGFR  ?Date Value Ref Range Status  ?06/07/2021 53 (L) >59 mL/min/1.73 Final  ?  ?  ?  ?  Failed - Valid encounter within last 6 months  ?  Recent Outpatient Visits   ? ?      ? 2 months ago Hyperglycemia  ? [place], [place], [place]  ? 9 months ago Upper respiratory infection, viral  ? [place], [place] W, NP  ? 1 year ago Hyperlipidemia LDL goal <130  ? [facility] Charlott Rakes, MD  ? 1 year ago Encounter to establish care  ? [place] [name], NP  ? ?  ?  ?Future  Appointments   ? ?        ? In 2 months [name], NP [place]  ? ?  ? ? ?  ?  ?  ?Signed Prescriptions Disp Refills  ? carvedilol (COREG) 25 MG tablet 90 tablet 0  ?  Sig: TAKE 1 TABLET (25 MG TOTAL) BY MOUTH 2 (TWO) TIMES DAILY.  ?  ? Cardiovascular: Beta Blockers 3 Failed - 03/23/2022  3:13 PM  ?  ?  Failed - Cr in normal range and within 360 days  ?  Creat  ?Date Value Ref Range Status  ?10/19/2016 1.32 0.60 - 1.35 mg/dL Final  ? ?Creatinine, Ser  ?Date Value Ref Range Status  ?07/11/2021 1.69 (H) 0.61 - 1.24 mg/dL Final  ? ?Creatinine, Urine  ?Date Value Ref Range Status  ?10/21/2014 240.9 mg/dL Final  ?  Comment:  ?  No reference range established. ?Performed at [facility] ?  ?  ?  ?  ?  Failed - Last BP in normal range  ?  BP Readings from Last 1 Encounters:  ?07/11/21 (!) 205/156  ?  ?  ?  ?  Failed - Valid encounter within last 6 months  ?  Recent Outpatient Visits   ? ?      ? 2 months ago Hyperglycemia  ? [place], [place], [place]  ? 9 months ago Upper respiratory infection, viral  ? [place], [place] W, NP  ? 1 year ago Hyperlipidemia LDL goal <130  ? [facility] Charlott Rakes, MD  ? 1 year ago Encounter to establish care  ? [place] [name], NP  ? ?  ?  ?Future Appointments   ? ?        ? In 2 months [name], NP [place]  ? ?  ? ? ?  ?  ?  Passed - AST in normal range and within 360 days  ?  AST  ?Date Value Ref Range Status  ?07/11/2021 16 15 - 41 U/L Final  ?  ?  ?  ?  Passed - ALT in normal range and within 360 days  ?  ALT  ?Date Value Ref Range Status  ?07/11/2021 19 0 - 44 U/L Final  ?  ?  ?  ?  Passed - Last Heart Rate in normal range  ?  Pulse Readings from Last 1 Encounters:  ?07/11/21 (!) 104  ?  ?  ?  ?  ? ? ?

## 2022-03-27 NOTE — Telephone Encounter (Signed)
Pt would like a call back to go over the status of his Rx Refills, please advise.  ?

## 2022-03-29 ENCOUNTER — Other Ambulatory Visit: Payer: Self-pay | Admitting: Physician Assistant

## 2022-03-29 DIAGNOSIS — I5022 Chronic systolic (congestive) heart failure: Secondary | ICD-10-CM

## 2022-03-31 NOTE — Telephone Encounter (Signed)
NO refills until office visit ?

## 2022-04-30 ENCOUNTER — Other Ambulatory Visit: Payer: Self-pay | Admitting: Nurse Practitioner

## 2022-04-30 DIAGNOSIS — I10 Essential (primary) hypertension: Secondary | ICD-10-CM

## 2022-04-30 DIAGNOSIS — I5022 Chronic systolic (congestive) heart failure: Secondary | ICD-10-CM

## 2022-05-31 ENCOUNTER — Other Ambulatory Visit: Payer: Self-pay | Admitting: Pharmacist

## 2022-05-31 DIAGNOSIS — E785 Hyperlipidemia, unspecified: Secondary | ICD-10-CM

## 2022-05-31 MED ORDER — ATORVASTATIN CALCIUM 40 MG PO TABS: ORAL_TABLET | Freq: Every day | ORAL | 0 refills | Status: DC

## 2022-06-14 ENCOUNTER — Ambulatory Visit: Payer: Self-pay | Admitting: Nurse Practitioner

## 2022-06-15 ENCOUNTER — Other Ambulatory Visit: Payer: Self-pay | Admitting: Family Medicine

## 2022-06-15 DIAGNOSIS — E785 Hyperlipidemia, unspecified: Secondary | ICD-10-CM

## 2022-06-15 NOTE — Telephone Encounter (Signed)
Called pt - LMOM to return call for appointment.

## 2022-06-15 NOTE — Telephone Encounter (Signed)
Requested medications are due for refill today.  yes  Requested medications are on the active medications list.  yes  Last refill. 05/31/2022 #30   Future visit scheduled.   no  Notes to clinic.  Pt already given a courtesy refill. Labs are expired. Pt needs an appt.    Requested Prescriptions  Pending Prescriptions Disp Refills   atorvastatin (LIPITOR) 40 MG tablet [Pharmacy Med Name: Atorvastatin Calcium 40 MG Oral Tablet] 30 tablet 11    Sig: TAKE 1 TABLET BY MOUTH DAILY     Cardiovascular:  Antilipid - Statins Failed - 06/15/2022  1:46 AM      Failed - Valid encounter within last 12 months    Recent Outpatient Visits           5 months ago Hyperglycemia   [facility] [place], Marzella Schlein, [place]   1 year ago Upper respiratory infection, viral   [place] [address] [facility] [place], Shea Stakes, NP   1 year ago Hyperlipidemia LDL goal <130   [facility] Hoy Register, MD   1 year ago Encounter to establish care   [facility] [place], [place] W, NP              Failed - Lipid Panel in normal range within the last 12 months    Cholesterol, Total  Date Value Ref Range Status  06/07/2021 119 100 - 199 mg/dL Final   LDL Chol Calc (NIH)  Date Value Ref Range Status  06/07/2021 64 0 - 99 mg/dL Final   Direct LDL  Date Value Ref Range Status  08/21/2019 184.0 mg/dL Final    Comment:    Optimal:  <100 mg/dLNear or Above Optimal:  100-129 mg/dLBorderline High:  130-159 mg/dLHigh:  160-189 mg/dLVery High:  >190 mg/dL   HDL  Date Value Ref Range Status  06/07/2021 39 (L) >39 mg/dL Final   Triglycerides  Date Value Ref Range Status  06/07/2021 79 0 - 149 mg/dL Final         Passed - Patient is not pregnant

## 2022-06-19 ENCOUNTER — Other Ambulatory Visit: Payer: Self-pay | Admitting: Family Medicine

## 2022-06-19 DIAGNOSIS — I5022 Chronic systolic (congestive) heart failure: Secondary | ICD-10-CM

## 2022-06-20 NOTE — Telephone Encounter (Signed)
Requested medications are due for refill today.  yes  Requested medications are on the active medications list.  yes  Last refill. 03/24/2022 #90 0 refills  Future visit scheduled.   yes  Notes to clinic.  Failed protocol d/t expired/abnormal labs.    Requested Prescriptions  Pending Prescriptions Disp Refills   FARXIGA 5 MG TABS tablet [Pharmacy Med Name: FARXIGA 5 MG TABLET] 90 tablet 0    Sig: TAKE 1 TABLET BY MOUTH DAILY BEFORE BREAKFAST.     Endocrinology:  Diabetes - SGLT2 Inhibitors Failed - 06/19/2022  2:06 AM      Failed - Cr in normal range and within 360 days    Creat  Date Value Ref Range Status  10/19/2016 1.32 0.60 - 1.35 mg/dL Final   Creatinine, Ser  Date Value Ref Range Status  07/11/2021 1.69 (H) 0.61 - 1.24 mg/dL Final   Creatinine, Urine  Date Value Ref Range Status  10/21/2014 240.9 mg/dL Final    Comment:    No reference range established. Performed at [facility]          Failed - HBA1C is between 0 and 7.9 and within 180 days    Hgb A1c MFr Bld  Date Value Ref Range Status  06/07/2021 5.9 (H) 4.8 - 5.6 % Final    Comment:             Prediabetes: 5.7 - 6.4          Diabetes: >6.4          Glycemic control for adults with diabetes: <7.0          Failed - eGFR in normal range and within 360 days    GFR calc Af Amer  Date Value Ref Range Status  08/12/2020 53 (L) >60 mL/min Final   GFR, Estimated  Date Value Ref Range Status  07/11/2021 52 (L) >60 mL/min Final    Comment:    (NOTE) Calculated using the CKD-EPI Creatinine Equation (2021)    GFR  Date Value Ref Range Status  08/21/2019 65.48 >60.00 mL/min Final   eGFR  Date Value Ref Range Status  06/07/2021 53 (L) >59 mL/min/1.73 Final         Failed - Valid encounter within last 6 months    Recent Outpatient Visits           5 months ago Hyperglycemia   [place], [place]   1 year ago Upper respiratory infection,  viral   [place], Vernia Buff, NP   1 year ago Hyperlipidemia LDL goal <130   [place], [place], MD   1 year ago Encounter to establish care   [place], Vernia Buff, NP       Future Appointments             In 1 month [place], Dionne Bucy, PA-C [place]

## 2022-06-24 ENCOUNTER — Other Ambulatory Visit: Payer: Self-pay | Admitting: Physician Assistant

## 2022-06-24 DIAGNOSIS — E876 Hypokalemia: Secondary | ICD-10-CM

## 2022-06-24 DIAGNOSIS — I10 Essential (primary) hypertension: Secondary | ICD-10-CM

## 2022-06-24 DIAGNOSIS — I5022 Chronic systolic (congestive) heart failure: Secondary | ICD-10-CM

## 2022-06-26 NOTE — Telephone Encounter (Signed)
Appointment  Scheduled- 8/17 Courtesy RF given Requested Prescriptions  Pending Prescriptions Disp Refills  . spironolactone (ALDACTONE) 25 MG tablet [Pharmacy Med Name: Spironolactone 25 MG Oral Tablet] 90 tablet 3    Sig: TAKE 1 TABLET BY MOUTH DAILY     Cardiovascular: Diuretics - Aldosterone Antagonist Failed - 06/24/2022  8:23 AM      Failed - Cr in normal range and within 180 days    Creat  Date Value Ref Range Status  10/19/2016 1.32 0.60 - 1.35 mg/dL Final   Creatinine, Ser  Date Value Ref Range Status  07/11/2021 1.69 (H) 0.61 - 1.24 mg/dL Final   Creatinine, Urine  Date Value Ref Range Status  10/21/2014 240.9 mg/dL Final    Comment:    No reference range established. Performed at [facility]          Failed - K in normal range and within 180 days    Potassium  Date Value Ref Range Status  07/11/2021 3.2 (L) 3.5 - 5.1 mmol/L Final         Failed - Na in normal range and within 180 days    Sodium  Date Value Ref Range Status  07/11/2021 139 135 - 145 mmol/L Final  06/07/2021 139 134 - 144 mmol/L Final         Failed - eGFR is 30 or above and within 180 days    GFR calc Af Amer  Date Value Ref Range Status  08/12/2020 53 (L) >60 mL/min Final   GFR, Estimated  Date Value Ref Range Status  07/11/2021 52 (L) >60 mL/min Final    Comment:    (NOTE) Calculated using the CKD-EPI Creatinine Equation (2021)    GFR  Date Value Ref Range Status  08/21/2019 65.48 >60.00 mL/min Final   eGFR  Date Value Ref Range Status  06/07/2021 53 (L) >59 mL/min/1.73 Final         Failed - Last BP in normal range    BP Readings from Last 1 Encounters:  07/11/21 (!) 205/156         Failed - Valid encounter within last 6 months    Recent Outpatient Visits          6 months ago Hyperglycemia   [place], [place]   1 year ago Upper respiratory infection, viral   [place],  Vernia Buff, NP   1 year ago Hyperlipidemia LDL goal <130   [place], [place]ong, MD   1 year ago Encounter to establish care   [place], Vernia Buff, NP      Future Appointments            In 3 weeks [place], Dionne Bucy, PA-C [place]           . isosorbide-hydrALAZINE (BIDIL) 20-37.5 MG tablet [Pharmacy Med Name: ISOSOR/HYDRAL TAB 20-37.5MG] 270 tablet 3    Sig: TAKE 1 TABLET BY MOUTH 3 TIMES  DAILY     Cardiovascular:  Vasodilators Failed - 06/24/2022  8:23 AM      Failed - HCT in normal range and within 360 days    HCT  Date Value Ref Range Status  07/11/2021 37.0 (L) 39.0 - 52.0 % Final   Hematocrit  Date Value Ref Range Status  06/07/2021 35.2 (L) 37.5 - 51.0 % Final  Failed - HGB in normal range and within 360 days    Hemoglobin  Date Value Ref Range Status  07/11/2021 11.1 (L) 13.0 - 17.0 g/dL Final  06/07/2021 11.3 (L) 13.0 - 17.7 g/dL Final         Failed - ANA Screen, Ifa, Serum in normal range and within 360 days    No results found for: "ANA", "ANATITER", "LABANTI"       Failed - Last BP in normal range    BP Readings from Last 1 Encounters:  07/11/21 (!) 205/156         Failed - Valid encounter within last 12 months    Recent Outpatient Visits          6 months ago Hyperglycemia   [place], [place], [place]   1 year ago Upper respiratory infection, viral   [place], [place] W, NP   1 year ago Hyperlipidemia LDL goal <130   [place], Charlane Ferretti, MD   1 year ago Encounter to establish care   [place], Vernia Buff, NP      Future Appointments            In 3 weeks Thereasa Solo, Dionne Bucy, PA-C [place] - RBC in normal range and within 360 days     RBC  Date Value Ref Range Status  07/11/2021 4.62 4.22 - 5.81 MIL/uL Final         Passed - WBC in normal range and within 360 days    WBC  Date Value Ref Range Status  07/11/2021 9.4 4.0 - 10.5 K/uL Final         Passed - PLT in normal range and within 360 days    Platelets  Date Value Ref Range Status  07/11/2021 366 150 - 400 K/uL Final  06/07/2021 396 150 - 450 x10E3/uL Final

## 2022-07-20 ENCOUNTER — Ambulatory Visit: Payer: Self-pay | Admitting: Physician Assistant

## 2022-07-20 DIAGNOSIS — R7303 Prediabetes: Secondary | ICD-10-CM

## 2022-08-02 ENCOUNTER — Other Ambulatory Visit: Payer: Self-pay | Admitting: Family Medicine

## 2022-08-02 DIAGNOSIS — E785 Hyperlipidemia, unspecified: Secondary | ICD-10-CM

## 2022-08-10 ENCOUNTER — Encounter: Payer: Self-pay | Admitting: Physician Assistant

## 2022-08-10 ENCOUNTER — Ambulatory Visit: Payer: Self-pay | Attending: Nurse Practitioner | Admitting: Physician Assistant

## 2022-08-10 VITALS — BP 177/140 | HR 99 | Ht 73.0 in | Wt 310.4 lb

## 2022-08-10 DIAGNOSIS — E876 Hypokalemia: Secondary | ICD-10-CM

## 2022-08-10 DIAGNOSIS — I5022 Chronic systolic (congestive) heart failure: Secondary | ICD-10-CM

## 2022-08-10 DIAGNOSIS — T502X5A Adverse effect of carbonic-anhydrase inhibitors, benzothiadiazides and other diuretics, initial encounter: Secondary | ICD-10-CM

## 2022-08-10 DIAGNOSIS — R7303 Prediabetes: Secondary | ICD-10-CM

## 2022-08-10 DIAGNOSIS — M62838 Other muscle spasm: Secondary | ICD-10-CM

## 2022-08-10 DIAGNOSIS — E785 Hyperlipidemia, unspecified: Secondary | ICD-10-CM

## 2022-08-10 DIAGNOSIS — I10 Essential (primary) hypertension: Secondary | ICD-10-CM

## 2022-08-10 MED ORDER — FUROSEMIDE 40 MG PO TABS: 40.0000 mg | ORAL_TABLET | Freq: Every day | ORAL | 0 refills | Status: DC

## 2022-08-10 MED ORDER — ATORVASTATIN CALCIUM 40 MG PO TABS: 40.0000 mg | ORAL_TABLET | Freq: Every day | ORAL | 1 refills | Status: DC

## 2022-08-10 MED ORDER — METHOCARBAMOL 500 MG PO TABS: 1000.0000 mg | ORAL_TABLET | Freq: Three times a day (TID) | ORAL | 0 refills | Status: DC | PRN

## 2022-08-10 MED ORDER — ISOSORB DINITRATE-HYDRALAZINE 20-37.5 MG PO TABS: 1.0000 | ORAL_TABLET | Freq: Three times a day (TID) | ORAL | 1 refills | Status: DC

## 2022-08-10 MED ORDER — CLONIDINE HCL 0.1 MG PO TABS
0.1000 mg | ORAL_TABLET | Freq: Once | ORAL | Status: AC
  Administered 2022-08-10: 0.1 mg via ORAL

## 2022-08-10 MED ORDER — CARVEDILOL 25 MG PO TABS: 25.0000 mg | ORAL_TABLET | Freq: Two times a day (BID) | ORAL | 1 refills | Status: DC

## 2022-08-10 MED ORDER — SPIRONOLACTONE 25 MG PO TABS: 25.0000 mg | ORAL_TABLET | Freq: Every day | ORAL | 1 refills | Status: DC

## 2022-08-10 MED ORDER — SACUBITRIL-VALSARTAN 24-26 MG PO TABS: 1.0000 | ORAL_TABLET | Freq: Two times a day (BID) | ORAL | 1 refills | Status: DC

## 2022-08-10 MED ORDER — DAPAGLIFLOZIN PROPANEDIOL 5 MG PO TABS: 5.0000 mg | ORAL_TABLET | Freq: Every day | ORAL | 1 refills | Status: DC

## 2022-08-10 NOTE — Patient Instructions (Addendum)
Check your blood pressures daily and record.  Bring log of BP to your next appt.  Make an appt with your cardiologist.  You are overdue to see them.

## 2022-08-10 NOTE — Progress Notes (Signed)
Patient ID: Steve Carrillo, male   DOB: 07-14-1981, 41 y.o.   MRN: [id number]   Steve Carrillo, is a 41 y.o. male  [id number]  [id number]  DOB - 20-Nov-1981  Chief Complaint  Patient presents with   Hypertension   Prediabetes       Subjective:   Steve Carrillo is a 41 y.o. male here today for a med RF and labs.  Compliance is questionable.  Works 2 jobs.  Never checks BP.  Overdue to see cardiology.  No HA/CP/dizziness or vision changes.  Says he didn't take meds today but that otherwise he takes them  No problems updated.  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 Authorizing Provider  atorvastatin (LIPITOR) 40 MG tablet Take 1 tablet (40 mg total) by mouth daily. 08/10/22   Anders Simmonds, PA-C  benzonatate (TESSALON) 100 MG capsule Take 1 capsule (100 mg total) by mouth 3 (three) times daily as needed for cough. Patient not taking: Reported on 08/10/2022 07/11/21   Fayrene Helper, PA-C  carvedilol (COREG) 25 MG tablet Take 1 tablet (25 mg total) by mouth 2 (two) times daily. 08/10/22   Anders Simmonds, PA-C  dapagliflozin propanediol (FARXIGA) 5 MG TABS tablet Take 1 tablet (5 mg total) by mouth daily before breakfast. 08/10/22   Anders Simmonds, PA-C  furosemide (LASIX) 40 MG tablet Take 1 tablet (40 mg total) by mouth daily. 08/10/22 11/08/22  Anders Simmonds, PA-C  isosorbide-hydrALAZINE (BIDIL) 20-37.5 MG tablet Take 1 tablet by mouth 3 (three) times daily. 08/10/22   Terez Montee, Marzella Schlein, PA-C  sacubitril-valsartan (ENTRESTO) 24-26 MG TAKE 1 TABLET BY MOUTH 2 (TWO) TIMES DAILY. 08/10/22   Anders Simmonds, PA-C  spironolactone (ALDACTONE) 25 MG tablet Take 1 tablet (25 mg total) by mouth daily. 08/10/22   Emine Lopata, Marzella Schlein, PA-C    ROS: Neg HEENT Neg resp Neg cardiac Neg GI Neg GU Neg MS Neg psych  Objective:   Vitals:   08/10/22 0909 08/10/22 0932  BP: (!) 200/151 (!) 178/130  Pulse: 99   SpO2: 95%   Weight: (!) 310 lb 6.4 oz (140.8 kg)   Height: 6\' 1"  (1.854 m)    Exam General appearance : Awake, alert, not in any distress. Speech Clear. Not toxic looking HEENT: Atraumatic and Normocephalic, pupils equally reactive to light and accomodation, no papillemdema Neck: Supple, no JVD. No cervical lymphadenopathy.  Chest: Good air entry bilaterally, CTAB.  No rales/rhonchi/wheezing CVS: S1 S2 regular, no murmurs.  Extremities: B/L Lower Ext shows no edema, both legs are warm to touch Neurology: Awake alert, and oriented X 3, CN II-XII intact, Non focal Skin: No Rash  Data Review Lab Results  Component Value Date   HGBA1C 5.9 (H) 06/07/2021   HGBA1C 5.7 (H) 08/07/2020   HGBA1C 6.1 08/21/2019    Assessment & Plan   1. Essential hypertension, malignant UNCONTROLLED. Dangerously high.  Discussed at length.  Risks including morbidities and even mortality discussed at length.   - cloNIDine (CATAPRES) tablet 0.1 mg - Comprehensive metabolic panel -  CBC with Differential/Platelet - carvedilol (COREG) 25 MG tablet; Take 1 tablet (25 mg total) by mouth 2 (two) times daily.  Dispense: 180 tablet; Refill: 1 - isosorbide-hydrALAZINE (BIDIL) 20-37.5 MG tablet; Take 1 tablet by mouth 3 (three) times daily.  Dispense: 270 tablet; Refill: 1 - spironolactone (ALDACTONE) 25 MG tablet; Take 1 tablet (25 mg total) by mouth daily.  Dispense: 90 tablet; Refill: 1 Check your blood pressures daily and record.  Bring log of BP to your next appt. Make an appt with your cardiologist.  You are overdue to see them.  2. Hyperlipidemia LDL goal <130 - CBC with Differential/Platelet - Lipid  panel - atorvastatin (LIPITOR) 40 MG tablet; Take 1 tablet (40 mg total) by mouth daily.  Dispense: 90 tablet; Refill: 1  3. Chronic systolic congestive heart failure (HCC) - carvedilol (COREG) 25 MG tablet; Take 1 tablet (25 mg total) by mouth 2 (two) times daily.  Dispense: 180 tablet; Refill: 1 - dapagliflozin propanediol (FARXIGA) 5 MG TABS tablet; Take 1 tablet (5 mg total) by mouth daily before breakfast.  Dispense: 90 tablet; Refill: 1 - furosemide (LASIX) 40 MG tablet; Take 1 tablet (40 mg total) by mouth daily.  Dispense: 90 tablet; Refill: 0 - isosorbide-hydrALAZINE (BIDIL) 20-37.5 MG tablet; Take 1 tablet by mouth 3 (three) times daily.  Dispense: 270 tablet; Refill: 1 - sacubitril-valsartan (ENTRESTO) 24-26 MG; TAKE 1 TABLET BY MOUTH 2 (TWO) TIMES DAILY.  Dispense: 180 tablet; Refill: 1 - spironolactone (ALDACTONE) 25 MG tablet; Take 1 tablet (25 mg total) by mouth daily.  Dispense: 90 tablet; Refill: 1 Check your blood pressures daily and record.  Bring log of BP to your next appt.  Make an appt with your cardiologist.  You are overdue to see them. 4. Prediabetes I have had a lengthy discussion and provided education about insulin resistance and the intake of too much sugar/refined carbohydrates.  I have advised the patient to work at a goal of eliminating sugary drinks, candy, desserts, sweets, refined sugars, processed foods, and white carbohydrates.  The patient expresses understanding.   - Comprehensive metabolic panel - CBC with Differential/Platelet - Hemoglobin A1c  5. Diuretic-induced hypokalemia - spironolactone (ALDACTONE) 25 MG tablet; Take 1 tablet (25 mg total) by mouth daily.  Dispense: 90 tablet; Refill: 1  6. Muscle spasm Methocarbamol sent    Return in about 1 month (around 09/09/2022) for [facility] for BP; 3 months with PCP.  The patient was given clear instructions to go to ER or return to medical center if symptoms don't improve, worsen or new problems  develop. The patient verbalized understanding. The patient was told to call to get lab results if they haven't heard anything in the next week.      Georgian Co, PA-C [facility] [employer] [facility] [employer] [place], [place] [phone]   08/10/2022, 9:50 AM

## 2022-08-11 LAB — CBC WITH DIFFERENTIAL/PLATELET
Basophils Absolute: 0.1 10*3/uL (ref 0.0–0.2)
Basos: 2 %
EOS (ABSOLUTE): 0.1 10*3/uL (ref 0.0–0.4)
Eos: 2 %
Hematocrit: 45.5 % (ref 37.5–51.0)
Hemoglobin: 14.3 g/dL (ref 13.0–17.7)
Immature Grans (Abs): 0 10*3/uL (ref 0.0–0.1)
Immature Granulocytes: 0 %
Lymphocytes Absolute: 1.5 10*3/uL (ref 0.7–3.1)
Lymphs: 32 %
MCH: 23.8 pg — ABNORMAL LOW (ref 26.6–33.0)
MCHC: 31.4 g/dL — ABNORMAL LOW (ref 31.5–35.7)
MCV: 76 fL — ABNORMAL LOW (ref 79–97)
Monocytes Absolute: 0.6 10*3/uL (ref 0.1–0.9)
Monocytes: 12 %
Neutrophils Absolute: 2.5 10*3/uL (ref 1.4–7.0)
Neutrophils: 52 %
Platelets: 330 10*3/uL (ref 150–450)
RBC: 6.02 x10E6/uL — ABNORMAL HIGH (ref 4.14–5.80)
RDW: 18.1 % — ABNORMAL HIGH (ref 11.6–15.4)
WBC: 4.7 10*3/uL (ref 3.4–10.8)

## 2022-08-11 LAB — COMPREHENSIVE METABOLIC PANEL
ALT: 24 IU/L (ref 0–44)
AST: 21 IU/L (ref 0–40)
Albumin/Globulin Ratio: 1.5 (ref 1.2–2.2)
Albumin: 4.1 g/dL (ref 4.1–5.1)
Alkaline Phosphatase: 94 IU/L (ref 44–121)
BUN/Creatinine Ratio: 13 (ref 9–20)
BUN: 24 mg/dL (ref 6–24)
Bilirubin Total: 0.8 mg/dL (ref 0.0–1.2)
CO2: 18 mmol/L — ABNORMAL LOW (ref 20–29)
Calcium: 9.8 mg/dL (ref 8.7–10.2)
Chloride: 103 mmol/L (ref 96–106)
Creatinine, Ser: 1.87 mg/dL — ABNORMAL HIGH (ref 0.76–1.27)
Globulin, Total: 2.8 g/dL (ref 1.5–4.5)
Glucose: 98 mg/dL (ref 70–99)
Potassium: 4.4 mmol/L (ref 3.5–5.2)
Sodium: 139 mmol/L (ref 134–144)
Total Protein: 6.9 g/dL (ref 6.0–8.5)
eGFR: 46 mL/min/{1.73_m2} — ABNORMAL LOW (ref >59)

## 2022-08-11 LAB — LIPID PANEL
Chol/HDL Ratio: 4.8 ratio (ref 0.0–5.0)
Cholesterol, Total: 231 mg/dL — ABNORMAL HIGH (ref 100–199)
HDL: 48 mg/dL (ref >39)
LDL Chol Calc (NIH): 169 mg/dL — ABNORMAL HIGH (ref 0–99)
Triglycerides: 78 mg/dL (ref 0–149)
VLDL Cholesterol Cal: 14 mg/dL (ref 5–40)

## 2022-08-11 LAB — HEMOGLOBIN A1C
Est. average glucose Bld gHb Est-mCnc: 126 mg/dL
Hgb A1c MFr Bld: 6 % — ABNORMAL HIGH (ref 4.8–5.6)

## 2022-08-14 ENCOUNTER — Telehealth: Payer: Self-pay | Admitting: Nurse Practitioner

## 2022-08-14 ENCOUNTER — Other Ambulatory Visit: Payer: Self-pay

## 2022-08-14 NOTE — Telephone Encounter (Signed)
Patient called and asked about the questions he has. He said [facility] would not send his Carvedilol, Methocarbamol and Entresto. He says they told him something about having to pay for them and he's never paid for carvedilol before. I advised he either owes a balance from having to make co-pays or the 2 medications are requiring a co-pay, so he will need to call [facility] to find out the reason for holding the medications. Advised to call us back with the medications and local pharmacy if he would like them to be sent elsewhere and that he could possibly use good rx to assist with cost, if high co-pays.

## 2022-08-14 NOTE — Telephone Encounter (Signed)
Pt called and stated that he would like to discuss medications. He did not give any other info. Please advise

## 2022-08-16 NOTE — Telephone Encounter (Signed)
Routing to CMA 

## 2022-08-16 NOTE — Telephone Encounter (Signed)
Message awaiting response from provider, will call patient with response.

## 2022-08-16 NOTE — Telephone Encounter (Signed)
Pt is returning nurse Lisa's call, which initially requested to speak with Georgian Co. Pt stated the reason they are holding medication is because the co-pay is around $1,000 for medication Entresto.   I asked pt per nurse Lisa's question below, if he is wanting the medication to be sent to a different pharmacy, stated, If possible, yes, but how much would it cost. Encouraged pt to reach out to insurance for assistance as well.   Requested call back from nurse to discuss.

## 2022-08-16 NOTE — Telephone Encounter (Signed)
Please advise, patient is restating that medication it too expensive.

## 2022-08-16 NOTE — Telephone Encounter (Signed)
Patient states since insurance will not cover sacubitril-valsartan (ENTRESTO) 24-26 MG  can PCP send in the generic alternative valsartan. Patient would like a follow up call.    [facility] ([facility] - [place], [place] - [address] Phone:  [phone]  Fax:  [phone]

## 2022-08-18 ENCOUNTER — Other Ambulatory Visit: Payer: Self-pay

## 2022-08-18 ENCOUNTER — Other Ambulatory Visit: Payer: Self-pay | Admitting: Nurse Practitioner

## 2022-08-18 DIAGNOSIS — I5043 Acute on chronic combined systolic (congestive) and diastolic (congestive) heart failure: Secondary | ICD-10-CM

## 2022-08-18 DIAGNOSIS — I429 Cardiomyopathy, unspecified: Secondary | ICD-10-CM

## 2022-08-18 MED ORDER — VALSARTAN 80 MG PO TABS
80.0000 mg | ORAL_TABLET | Freq: Every day | ORAL | 3 refills | Status: DC
  Filled 2022-08-18: qty 30, 30d supply, fill #0

## 2022-08-18 NOTE — Telephone Encounter (Signed)
Left voicemail to return call. 

## 2022-08-18 NOTE — Telephone Encounter (Signed)
Valsartan has been sent. Needs to follow up with Cardiology. They will call him to schedule. If you have not heard from them in 2 weeks. Need to let our office know. Also needs to keep appt with luke on 09-14-2022 for BP check and labs.

## 2022-08-22 ENCOUNTER — Other Ambulatory Visit: Payer: Self-pay

## 2022-08-22 NOTE — Progress Notes (Unsigned)
Cardiology Clinic Note   Patient Name: Steve Carrillo Date of Encounter: 08/24/2022  Primary Care Provider:  [name], NP Primary Cardiologist:  Sanda Klein, MD  Patient Profile    Steve Carrillo 41 year old male presents the clinic today for follow-up evaluation of his essential hypertension and cardiomyopathy.  Past Medical History    Past Medical History:  Diagnosis Date   Cardiomyopathy due to hypertension ([place]) 10/2014   NICM EF 35-40%, global LV strain is abnormal at -10.9%   Dilated aortic root ([place])    seen on previous echo 02/2016   Hyperlipidemia LDL goal <130 2015   Hypertension    Prediabetes    Past Surgical History:  Procedure Laterality Date   HERNIA REPAIR     LEFT HEART CATHETERIZATION WITH CORONARY ANGIOGRAM N/A 10/22/2014   Procedure: LEFT HEART CATHETERIZATION WITH CORONARY ANGIOGRAM;  Surgeon: Peter M Martinique, MD; Normal coronary anatomy. Vessels are very large due to hypertensive cardiomyopathy.    RIGHT/LEFT HEART CATH AND CORONARY ANGIOGRAPHY N/A 08/10/2020   Procedure: RIGHT/LEFT HEART CATH AND CORONARY ANGIOGRAPHY;  Surgeon: Martinique, Peter M, MD;  Location: [place];  Service: Cardiovascular;  Laterality: N/A;    Allergies  No Known Allergies  History of Present Illness    Steve Carrillo has a PMH of acute on chronic combined systolic and diastolic CHF, HTN, OSA, CKD stage II, prolonged QT interval, HLD, morbid obesity, abnormal chest CT and hypokalemia.  His PMH also includes noncompliance.  He underwent cardiac cath in 2015 which showed no CAD  He was last seen by cardiology during his admission 9/21.  He had been admitted for severe hypertension and CHF exacerbation.  He had not taken medications in 2 months.  His EF was noted to be 35-40 in 2017.  During his admission he was noted to have an EF of 20-25% with G3 DD.  He was restarted on home medications with plan for up titration.  His blood pressures at discharge were 130s  over 80s.  His cholesterol panel 08/06/2020 showed a total cholesterol 149, HDL 27, LDL 107, triglycerides 74.  He was encouraged to increase his physical activity.  He was noted to have CKD stage II with a creatinine of 1.49 which was at his baseline.  He was lost to follow-up.  He presents to the clinic today for follow-up evaluation states he feels fairly well.  He continues to work 2 jobs.  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  His blood pressure today is 148/94 and on recheck is 142/88.  We reviewed his medications and he is taking appropriately.  He reports that he is unable to afford his Delene Loll and that it costs over $1000 per month.  He is in the process of finishing Entresto and starting valsartan.  He is stable from a cardiac standpoint today.  I will put in a social work consult, order BMP 1 week after starting valsartan, give weight log, get blood pressure log, give salty 6 diet information have him maintain his physical activity and plan follow-up in 3.  Social work was consulted during visit.  We were able to give patient a co-pay card.  This made his Entresto $10.  I will uptitrate his Entresto to 49/51.  Order a BMP and 1 week after initiation, plan follow-up for 1-2 months.  And continue previous recommendations.  Today he denies chest pain, shortness of breath, lower extremity edema, fatigue, palpitations,  melena, hematuria, hemoptysis, diaphoresis, weakness, presyncope, syncope, orthopnea, and PND.    Home Medications    Prior to Admission medications   Medication Sig Start Date End Date Taking? Authorizing Provider  atorvastatin (LIPITOR) 40 MG tablet Take 1 tablet (40 mg total) by mouth daily. 08/10/22   Argentina Donovan, PA-C  benzonatate (TESSALON) 100 MG capsule Take 1 capsule (100 mg total) by mouth 3 (three) times daily as needed for cough. Patient not taking: Reported on 08/10/2022 07/11/21   Domenic Moras, PA-C  carvedilol (COREG) 25 MG tablet Take 1  tablet (25 mg total) by mouth 2 (two) times daily. 08/10/22   Argentina Donovan, PA-C  dapagliflozin propanediol (FARXIGA) 5 MG TABS tablet Take 1 tablet (5 mg total) by mouth daily before breakfast. 08/10/22   Argentina Donovan, PA-C  furosemide (LASIX) 40 MG tablet Take 1 tablet (40 mg total) by mouth daily. 08/10/22 11/08/22  Argentina Donovan, PA-C  isosorbide-hydrALAZINE (BIDIL) 20-37.5 MG tablet Take 1 tablet by mouth 3 (three) times daily. 08/10/22   Argentina Donovan, PA-C  methocarbamol (ROBAXIN) 500 MG tablet Take 2 tablets (1,000 mg total) by mouth every 8 (eight) hours as needed for muscle spasms. 08/10/22   Argentina Donovan, PA-C  spironolactone (ALDACTONE) 25 MG tablet Take 1 tablet (25 mg total) by mouth daily. 08/10/22   Argentina Donovan, PA-C  valsartan (DIOVAN) 80 MG tablet Take 1 tablet (80 mg total) by mouth daily. 08/18/22   [name], NP    Family History    Family History  Problem Relation Age of Onset   Diabetes Mellitus II Mother    Hypertension Mother    Hypertension Brother    Diabetes Mellitus II Maternal Grandmother    He indicated that his mother is alive. He indicated that his father is alive. He indicated that his sister is alive. He indicated that both of his brothers are alive. He indicated that his maternal grandmother is alive.  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    Social Determinants of Health   Financial Resource Strain: Not on file  Food Insecurity: Not on file  Transportation Needs: Not on file  Physical Activity: Not on file  Stress: Not on file  Social Connections: Not on file  Intimate Partner Violence: Not on file     Review of Systems    General:  No chills, fever, night sweats or weight changes.  Cardiovascular:  No chest pain, dyspnea on exertion, edema, orthopnea, palpitations, paroxysmal nocturnal dyspnea. Dermatological: No rash, lesions/masses Respiratory: No cough, dyspnea Urologic: No hematuria, dysuria Abdominal:  No nausea, vomiting, diarrhea, bright red blood per rectum, melena, or hematemesis Neurologic:  No visual changes, wkns, changes in mental status. All other systems reviewed and are otherwise negative except as noted above.  Physical Exam    VS:  BP (!) 142/88   Pulse 91   Ht 6\' 1"  (1.854 m)   Wt (!) 316 lb 6.4 oz (143.5 kg)   BMI 41.74 kg/m  , BMI Body mass index is 41.74 kg/m. GEN: Well nourished, well developed, in no acute distress. HEENT: normal. Neck: Supple, no JVD, carotid bruits, or masses. Cardiac: RRR, no murmurs, rubs, or gallops. No clubbing, cyanosis, edema.  Radials/DP/PT 2+ and equal bilaterally.  Respiratory:  Respirations regular and unlabored, clear to auscultation bilaterally. GI: Soft, nontender, nondistended, BS + x 4. MS: no deformity or atrophy. Skin: warm and dry, no rash. Neuro:  Strength and sensation are intact. Psych: Normal affect.  Accessory Clinical Findings    Recent Labs: 08/10/2022: ALT 24; BUN 24; Creatinine, Ser 1.87; Hemoglobin 14.3; Platelets 330; Potassium 4.4; Sodium 139   Recent Lipid Panel    Component Value Date/Time   CHOL 231 (H) 08/10/2022 1016   TRIG 78 08/10/2022 1016   HDL 48 08/10/2022 1016   CHOLHDL 4.8 08/10/2022 1016   CHOLHDL  5.5 08/06/2020 1410   VLDL 15 08/06/2020 1410   LDLCALC 169 (H) 08/10/2022 1016   LDLDIRECT 184.0 08/21/2019 0848    HYPERTENSION CONTROL Vitals:   08/24/22 1022 08/24/22 1113  BP: (!) 148/94 (!) 142/88    The patient's blood pressure is elevated above target today.  In order to address the patient's elevated BP: Blood pressure will be monitored at home to determine if medication changes need to be made.; A current anti-hypertensive medication was adjusted today.; The blood pressure is usually elevated in clinic.  Blood pressures monitored at home have been optimal.       ECG personally reviewed by me today-sinus rhythm with first-degree AV block biatrial enlargement LVH 91 bpm  Echocardiogram 08/07/2020  IMPRESSIONS     1. Left ventricular ejection fraction, by estimation, is 20 to 25%. The  left ventricle has severely decreased function. The left ventricle  demonstrates global hypokinesis. The left ventricular internal cavity size  was severely dilated. There is mild  left ventricular hypertrophy. Left ventricular diastolic parameters are  consistent with Grade III diastolic dysfunction (restrictive). Elevated  left atrial pressure.   2. Right ventricular systolic function is normal. The right ventricular  size is normal. Tricuspid regurgitation signal is inadequate for assessing  PA pressure.   3. The mitral valve is normal in structure. Mild mitral valve  regurgitation.   4. The aortic valve was not well visualized. Aortic valve regurgitation  is mild. No aortic stenosis is present.   5. Aortic dilatation noted. There is mild dilatation of the aortic root  measuring 40 mm.   6. The inferior vena cava is dilated in size with <50% respiratory  variability, suggesting right atrial pressure of 15 mmHg.  Assessment & Plan   1.  Acute on chronic systolic and diastolic CHF-no increased DOE or activity intolerance.  Previous EF noted to be 20-25% with G3 DD. Continue  carvedilol, Farxiga, furosemide, isosorbide, hydralazine, spironolactone Increase Entresto 49/51 Heart healthy low-sodium diet-salty 6 given Increase physical activity as tolerated Plan for repeat echocardiogram once GDMT has been optimized. Maintain blood pressure log Maintain weight log Social work consult Order BMP in 1 week  Essential hypertension-BP today 142/88.  Continues  to be noncompliant with medications.  We were able to get the patient a insurance co-pay card.  This makes his Entresto $10 per month. Continue carvedilol, furosemide, isosorbide, hydralazine, spironolactone,  Entresto Heart healthy low-sodium diet-salty 6 given Increase physical activity as tolerated Paperwork for medication assistance given Social work consult Repeat BMP after starting valsartan x1 week  Hyperlipidemia-LDL 169.  Reports compliance with atorvastatin Continue atorvastatin Heart healthy low-sodium high-fiber diet Increase physical activity as tolerated CKD stage II-baseline creatinine around 1.69. Follows with PCP  Morbid obesity-weight today 316.6 lbs. Increase physical activity as tolerated Heart healthy low-sodium diet Refer to [facility] healthy weight and wellness  Disposition: Follow-up with Dr. Sallyanne Kuster in 3-4 months.   Jossie Ng. Gaila Engebretsen NP-C     08/24/2022, [place] Office [phone] Fax [phone]  Notice: This dictation was prepared with Dragon dictation along with smaller phrase technology. Any transcriptional errors that result from this process are unintentional and may not be corrected upon review.  I spent 15 minutes examining this patient, reviewing medications, and using patient centered shared decision making involving her cardiac care.  Prior to her visit I spent greater than 20 minutes reviewing her past medical history,  medications, and prior cardiac tests.

## 2022-08-24 ENCOUNTER — Ambulatory Visit: Payer: No Typology Code available for payment source | Attending: General Practice | Admitting: General Practice

## 2022-08-24 ENCOUNTER — Encounter: Payer: Self-pay | Admitting: General Practice

## 2022-08-24 ENCOUNTER — Other Ambulatory Visit: Payer: Self-pay

## 2022-08-24 VITALS — BP 142/88 | HR 91 | Ht 73.0 in | Wt 316.4 lb

## 2022-08-24 DIAGNOSIS — N182 Chronic kidney disease, stage 2 (mild): Secondary | ICD-10-CM

## 2022-08-24 DIAGNOSIS — I5043 Acute on chronic combined systolic (congestive) and diastolic (congestive) heart failure: Secondary | ICD-10-CM | POA: Diagnosis not present

## 2022-08-24 DIAGNOSIS — I10 Essential (primary) hypertension: Secondary | ICD-10-CM

## 2022-08-24 DIAGNOSIS — E782 Mixed hyperlipidemia: Secondary | ICD-10-CM

## 2022-08-24 MED ORDER — SACUBITRIL-VALSARTAN 49-51 MG PO TABS
1.0000 | ORAL_TABLET | Freq: Two times a day (BID) | ORAL | 4 refills | Status: DC
  Filled 2022-08-24 – 2022-10-13 (×2): qty 60, 30d supply, fill #0

## 2022-08-24 NOTE — Patient Instructions (Signed)
Medication Instructions:  INCREASE ENTRESTO 49/51MG  TWICE DAILY *If you need a refill on your cardiac medications before your next appointment, please call your pharmacy*  Lab Work: BMET 1 [place]  If you have labs (blood work) drawn today and your tests are completely normal, you will receive your results only by: [facility] (if you have MyChart) OR A paper copy in the mail If you have any lab test that is abnormal or we need to change your treatment, we will call you to review the results.  Follow-Up: At [facility], you and your health needs are our priority.  As part of our continuing mission to provide you with exceptional heart care, we have created designated Provider Care Teams.  These Care Teams include your primary Cardiologist (physician) and Advanced Practice Providers (APPs -  Physician Assistants and Nurse Practitioners) who all work together to provide you with the care you need, when you need it.  We recommend signing up for the patient portal called "MyChart".  Sign up information is provided on this After Visit Summary.  MyChart is used to connect with patients for Virtual Visits (Telemedicine).  Patients are able to view lab/test results, encounter notes, upcoming appointments, etc.  Non-urgent messages can be sent to your provider as well.   To learn more about what you can do with MyChart, go to [email or web address].    Your next appointment:   1-2 month(s)  The format for your next appointment:   In Person  Provider:   Sanda Klein, MD     Other Instructions TAKE AND LOG YOUR BLOOD PRESSURE DAILY  TAKE AND LOG YOUR WEIGHT DAILY CALL IF YOU GAIN 3 POUNDS IN A DAY OR 5 POUNDS IN A WEEK  PLEASE READ AND FOLLOW SALTY 6 ATTACHED  MAINTAIN YOUR PHYSICAL ACTIVITY  Important Information About Sugar

## 2022-08-30 ENCOUNTER — Other Ambulatory Visit: Payer: Self-pay

## 2022-09-13 NOTE — Progress Notes (Deleted)
   S:     PCP: Steve Carrillo is a 41 y.o. male who presents for hypertension evaluation, education, and management.  PMH is significant for OSA, HTN, HFrEF, obesity, HLD, pre-DM, CKD stage 3a.   Patient was referred and last seen by Primary Care Provider, PA Steve Carrillo on 9/7. At that visit, BP was elevated at 177/140. He had not taken his medications at that time.   At last visit with cardiology on 9/21, he reported that he has been unable to afford his Entresto. He was enrolled in a copay card and and the dose was increased.   Today, patient arrives in *** spirits and presents without *** assistance. *** Denies dizziness, headache, blurred vision, swelling.   Patient reports hypertension was diagnosed in ***.   Family/Social history: ***  Medication adherence *** . Patient has *** taken BP medications today.   Current antihypertensives include: carvedilol 25mg  BID, BIDIL 20/37.5mg  TID, Valsartan 80mg  once daily, spironolactone 25mg  once daily   Reported home BP readings: ***  Patient reported dietary habits: Eats *** meals/day Breakfast: *** Lunch: *** Dinner: *** Snacks: *** Drinks: ***  Patient-reported exercise habits: ***  ASCVD risk factors include: ***  O:    Last 3 Office BP readings: BP Readings from Last 3 Encounters:  08/24/22 (!) 142/88  08/10/22 (!) 177/140  07/11/21 (!) 205/156    BMET    Component Value Date/Time   NA 139 08/10/2022 1016   K 4.4 08/10/2022 1016   CL 103 08/10/2022 1016   CO2 18 (L) 08/10/2022 1016   GLUCOSE 98 08/10/2022 1016   GLUCOSE 113 (H) 07/11/2021 1845   BUN 24 08/10/2022 1016   CREATININE 1.87 (H) 08/10/2022 1016   CREATININE 1.32 10/19/2016 1038   CALCIUM 9.8 08/10/2022 1016   GFRNONAA 52 (L) 07/11/2021 1845   GFRAA 53 (L) 08/12/2020 0548    Renal function: CrCl cannot be calculated (Patient's most recent lab result is older than the maximum 21 days allowed.).  Clinical ASCVD: No  The  10-year ASCVD risk score (Arnett DK, et al., 2019) is: 6.6%   Values used to calculate the score:     Age: 24 years     Sex: Male     Is Non-Hispanic African American: Yes     Diabetic: No     Tobacco smoker: No     Systolic Blood Pressure: 366 mmHg     Is BP treated: Yes     HDL Cholesterol: 48 mg/dL     Total Cholesterol: 231 mg/dL   A/P: Hypertension diagnosed *** currently *** on current medications. BP goal < 130/80 *** mmHg. Medication adherence appears ***. Control is suboptimal due to ***.  -{Meds adjust:18428} ***.  -Patient educated on purpose, proper use, and potential adverse effects of ***.  -F/u labs ordered - *** -Counseled on lifestyle modifications for blood pressure control including reduced dietary sodium, increased exercise, adequate sleep. -Encouraged patient to check BP at home and bring log of readings to next visit. Counseled on proper use of home BP cuff.    Results reviewed and written information provided.    Written patient instructions provided. Patient verbalized understanding of treatment plan.  Total time in face to face counseling *** minutes.    Follow-up:  Pharmacist December. PCP clinic visit in   Maryan Puls, PharmD PGY-1 [facility] Resident

## 2022-09-14 ENCOUNTER — Ambulatory Visit: Payer: Self-pay | Admitting: Pharmacist

## 2022-10-08 NOTE — Progress Notes (Signed)
Cardiology Clinic Note   Patient Name: Steve Carrillo Date of Encounter: 10/13/2022  Primary Care Provider:  [name], NP Primary Cardiologist:  Sanda Klein, MD  Patient Profile    Steve Carrillo 41 year old male presents the clinic today for follow-up evaluation of his essential hypertension and cardiomyopathy.  Past Medical History    Past Medical History:  Diagnosis Date   Cardiomyopathy due to hypertension ([place]) 10/2014   NICM EF 35-40%, global LV strain is abnormal at -10.9%   Dilated aortic root ([place])    seen on previous echo 02/2016   Hyperlipidemia LDL goal <130 2015   Hypertension    Prediabetes    Past Surgical History:  Procedure Laterality Date   HERNIA REPAIR     LEFT HEART CATHETERIZATION WITH CORONARY ANGIOGRAM N/A 10/22/2014   Procedure: LEFT HEART CATHETERIZATION WITH CORONARY ANGIOGRAM;  Surgeon: Peter M Martinique, MD; Normal coronary anatomy. Vessels are very large due to hypertensive cardiomyopathy.    RIGHT/LEFT HEART CATH AND CORONARY ANGIOGRAPHY N/A 08/10/2020   Procedure: RIGHT/LEFT HEART CATH AND CORONARY ANGIOGRAPHY;  Surgeon: Martinique, Peter M, MD;  Location: [place] [facility];  Service: Cardiovascular;  Laterality: N/A;    Allergies  No Known Allergies  History of Present Illness    Steve Carrillo has a PMH of acute on chronic combined systolic and diastolic CHF, HTN, OSA, CKD stage II, prolonged QT interval, HLD, morbid obesity, abnormal chest CT and hypokalemia.  His PMH also includes noncompliance.  He underwent cardiac cath in 2015 which showed no CAD  He was last seen by cardiology during his admission 9/21.  He had been admitted for severe hypertension and CHF exacerbation.  He had not taken medications in 2 months.  His EF was noted to be 35-40 in 2017.  During his admission he was noted to have an EF of 20-25% with G3 DD.  He was restarted on home medications with plan for up titration.  His blood pressures at discharge were 130s  over 80s.  His cholesterol panel 08/06/2020 showed a total cholesterol 149, HDL 27, LDL 107, triglycerides 74.  He was encouraged to increase his physical activity.  He was noted to have CKD stage II with a creatinine of 1.49 which was at his baseline.  He was lost to follow-up.  He presented to the clinic 08/24/22 for follow-up evaluation stated he felt fairly well.  He continued to work 2 jobs.  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  His blood pressure was 148/94 and on recheck is 142/88.  We reviewed his medications and he was taking appropriately.  He reported that he was unable to afford his Delene Loll and that it would cost over $1000 per month.  He was in the process of finishing Entresto and starting valsartan.  He was stable from a cardiac standpoint .  I  put in a social work consult, gave weight log, gave blood pressure log, gave salty 6 diet information, asked him to maintain his physical activity and planned follow-up in 3.  Social work was consulted during visit.  We were able to give patient a co-pay card.  This made his Entresto $10.  I  uptitrated his Entresto to 49/51.  Ordered a BMP and 1 week after initiation, planned follow-up for 1-2 months  and continued previous recommendations.  BMP noted slightly elevated creatinine on recheck.  He presents to the clinic today for follow-up evaluation states he  continues to be physically active working his 2 jobs.  We again reviewed his medications.  He reports that he has been out of valsartan and did not start Entresto.  We reviewed the importance of medication compliance and the importance of good blood pressure control with his heart failure.  He expressed understanding.  He has been trying to avoid salt in his diet.  I have instructed him to contact the office if he has trouble affording the medication or obtaining it.  I will reorder Entresto, order BMP 1 week after starting, and can follow-up in 1 month.  Today he denies  chest pain, shortness of breath, lower extremity edema, fatigue, palpitations, melena, hematuria, hemoptysis, diaphoresis, weakness, presyncope, syncope, orthopnea, and PND.    Home Medications    Prior to Admission medications   Medication Sig Start Date End Date Taking? Authorizing Provider  atorvastatin (LIPITOR) 40 MG tablet Take 1 tablet (40 mg total) by mouth daily. 08/10/22   Argentina Donovan, PA-C  benzonatate (TESSALON) 100 MG capsule Take 1 capsule (100 mg total) by mouth 3 (three) times daily as needed for cough. Patient not taking: Reported on 08/10/2022 07/11/21   Domenic Moras, PA-C  carvedilol (COREG) 25 MG tablet Take 1 tablet (25 mg total) by mouth 2 (two) times daily. 08/10/22   Argentina Donovan, PA-C  dapagliflozin propanediol (FARXIGA) 5 MG TABS tablet Take 1 tablet (5 mg total) by mouth daily before breakfast. 08/10/22   Argentina Donovan, PA-C  furosemide (LASIX) 40 MG tablet Take 1 tablet (40 mg total) by mouth daily. 08/10/22 11/08/22  Argentina Donovan, PA-C  isosorbide-hydrALAZINE (BIDIL) 20-37.5 MG tablet Take 1 tablet by mouth 3 (three) times daily. 08/10/22   Argentina Donovan, PA-C  methocarbamol (ROBAXIN) 500 MG tablet Take 2 tablets (1,000 mg total) by mouth every 8 (eight) hours as needed for muscle spasms. 08/10/22   Argentina Donovan, PA-C  spironolactone (ALDACTONE) 25 MG tablet Take 1 tablet (25 mg total) by mouth daily. 08/10/22   Argentina Donovan, PA-C  valsartan (DIOVAN) 80 MG tablet Take 1 tablet (80 mg total) by mouth daily. 08/18/22   [name], NP    Family History    Family History  Problem Relation Age of Onset   Diabetes Mellitus II Mother    Hypertension Mother    Hypertension Brother    Diabetes Mellitus II Maternal Grandmother    He indicated that his mother is alive. He indicated that his father is alive. He indicated that his sister is alive. He indicated that both of his brothers are alive. He indicated that his maternal grandmother is  alive.  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    Social Determinants of Health   Financial Resource Strain: Not on file  Food Insecurity: Not on file  Transportation Needs: Not on file  Physical Activity: Not on file  Stress: Not on file  Social Connections: Not on file  Intimate Partner Violence: Not on file     Review of Systems    General:  No chills, fever, night sweats or weight changes.  Cardiovascular:  No chest pain, dyspnea on exertion, generalized lower extremity nonpitting edema, orthopnea, palpitations, paroxysmal nocturnal dyspnea. Dermatological: No rash, lesions/masses Respiratory: No cough, dyspnea Urologic: No hematuria, dysuria Abdominal:   No nausea, vomiting, diarrhea, bright red blood per rectum, melena, or hematemesis Neurologic:  No visual changes, wkns, changes in mental status. All other systems reviewed and are otherwise negative except as noted above.  Physical Exam    VS:  BP (!) 150/72   Pulse 83   Ht 6\' 1"  (1.854 m)   Wt (!) 326 lb 12.8 oz (148.2 kg)   SpO2 97%   BMI 43.12 kg/m  , BMI Body mass index is 43.12 kg/m. GEN: Well nourished, well developed, in no acute distress. HEENT: normal. Neck: Supple, no JVD, carotid bruits, or  masses. Cardiac: RRR, no murmurs, rubs, or gallops. No clubbing, cyanosis, edema.  Radials/DP/PT 2+ and equal bilaterally.  Respiratory:  Respirations regular and unlabored, clear to auscultation bilaterally. GI: Soft, nontender, nondistended, BS + x 4. MS: no deformity or atrophy. Skin: warm and dry, no rash. Neuro:  Strength and sensation are intact. Psych: Normal affect.  Accessory Clinical Findings    Recent Labs: 08/10/2022: ALT 24; BUN 24; Creatinine, Ser 1.87; Hemoglobin 14.3; Platelets 330; Potassium 4.4; Sodium 139   Recent Lipid Panel    Component Value Date/Time   CHOL 231 (H) 08/10/2022 1016   TRIG 78 08/10/2022 1016   HDL 48 08/10/2022 1016   CHOLHDL 4.8 08/10/2022 1016   CHOLHDL 5.5 08/06/2020 1410   VLDL 15 08/06/2020 1410   LDLCALC 169 (H) 08/10/2022 1016   LDLDIRECT 184.0 08/21/2019 0848    HYPERTENSION CONTROL Vitals:   10/13/22 1009 10/13/22 1032  BP: (!) 160/109 (!) 150/72    The patient's blood pressure is elevated above target today.  In order to address the patient's elevated BP: Blood pressure will be monitored at home to determine if medication changes need to be made.; A new medication was prescribed today.       ECG personally reviewed by me today-none today.   EKG 08/24/2022 sinus rhythm with first-degree AV block biatrial enlargement LVH 91 bpm  Echocardiogram 08/07/2020  IMPRESSIONS     1. Left ventricular ejection fraction, by estimation, is 20 to 25%. The  left ventricle has severely decreased function. The left ventricle  demonstrates global hypokinesis. The left ventricular internal cavity size  was severely dilated. There is mild  left ventricular hypertrophy. Left ventricular diastolic parameters are  consistent with Grade III diastolic dysfunction (restrictive). Elevated  left atrial pressure.   2. Right ventricular systolic function is normal. The right ventricular  size is normal. Tricuspid regurgitation signal is inadequate  for assessing  PA pressure.   3. The mitral valve is normal in structure. Mild mitral valve  regurgitation.   4. The aortic valve was not well visualized. Aortic valve regurgitation  is mild. No aortic stenosis is present.   5. Aortic dilatation noted. There is mild dilatation of  the aortic root  measuring 40 mm.   6. The inferior vena cava is dilated in size with <50% respiratory  variability, suggesting right atrial pressure of 15 mmHg.  Assessment & Plan   1.  Acute on chronic systolic and diastolic CHF-stable.  NYHA class I-2.  Previous EF noted to be 20-25% with G3 DD. Continue carvedilol, Farxiga, furosemide, isosorbide, hydralazine, spironolactone Start Entresto 49/51 Heart healthy low-sodium diet Increase physical activity as tolerated Plan for repeat echocardiogram once GDMT has been optimized.-1 month after Maintain blood pressure log Maintain weight log Order BMP-if renal function stable we will plan for up titration of Entresto  Essential hypertension-BP today 160/109 and on recheck 150/72.  Continues to be noncompliant with medications.  Contacted pharmacy, reordered [employer].  We were able to get the patient a insurance co-pay card.  This makes his Entresto $10 per month.  I instructed him to contact the clinic if he had any trouble with obtaining medication or affordability.  He expressed understanding. Continue carvedilol, furosemide, isosorbide, hydralazine, spironolactone,  Entresto Heart healthy low-sodium diet  Increase physical activity as tolerated   Morbid obesity-weight today 326.8 lbs. Increase physical activity  Heart healthy low-sodium diet-reports he is trying to avoid salt.   Disposition: Follow-up with Dr. Sallyanne Kuster or me in 1 months.   Jossie Ng. Andelyn Spade NP-C     10/13/2022, 10:38 AM [place] [address] Office [phone] Fax [phone]  Notice: This dictation was prepared with Dragon dictation  along with smaller phrase technology. Any transcriptional errors that result from this process are unintentional and may not be corrected upon review.  I spent 14 minutes examining this patient, reviewing medications, and using patient centered shared decision making involving her cardiac care.  Prior to her visit I spent greater than 20 minutes reviewing her past medical history,  medications, and prior cardiac tests.

## 2022-10-13 ENCOUNTER — Other Ambulatory Visit: Payer: Self-pay

## 2022-10-13 ENCOUNTER — Ambulatory Visit: Payer: No Typology Code available for payment source | Attending: General Practice | Admitting: General Practice

## 2022-10-13 ENCOUNTER — Encounter: Payer: Self-pay | Admitting: General Practice

## 2022-10-13 VITALS — BP 150/72 | HR 83 | Ht 73.0 in | Wt 326.8 lb

## 2022-10-13 DIAGNOSIS — I10 Essential (primary) hypertension: Secondary | ICD-10-CM

## 2022-10-13 DIAGNOSIS — I5043 Acute on chronic combined systolic (congestive) and diastolic (congestive) heart failure: Secondary | ICD-10-CM

## 2022-10-13 MED ORDER — ENTRESTO 49-51 MG PO TABS
1.0000 | ORAL_TABLET | Freq: Two times a day (BID) | ORAL | 3 refills | Status: DC
  Filled 2022-10-13 – 2022-10-20 (×2): qty 60, 30d supply, fill #0

## 2022-10-13 NOTE — Patient Instructions (Addendum)
Medication Instructions:  RESTART Entresto 49/51mg  Take 1 tablet twice a day  *If you need a refill on your cardiac medications before your next appointment, please call your pharmacy*   Lab Work: BMET IN 1 WEEK AFTER TAKING ENTRESTO If you have labs (blood work) drawn today and your tests are completely normal, you will receive your results only by: MyChart Message (if you have MyChart) OR A paper copy in the mail If you have any lab test that is abnormal or we need to change your treatment, we will call you to review the results.   Testing/Procedures: NONE ORDERED   Follow-Up: At [facility], you and your health needs are our priority.  As part of our continuing mission to provide you with exceptional heart care, we have created designated Provider Care Teams.  These Care Teams include your primary Cardiologist (physician) and Advanced Practice Providers (APPs -  Physician Assistants and Nurse Practitioners) who all work together to provide you with the care you need, when you need it.  We recommend signing up for the patient portal called "MyChart".  Sign up information is provided on this After Visit Summary.  MyChart is used to connect with patients for Virtual Visits (Telemedicine).  Patients are able to view lab/test results, encounter notes, upcoming appointments, etc.  Non-urgent messages can be sent to your provider as well.   To learn more about what you can do with MyChart, go to [email or web address].    Your next appointment:   1 month(s)  The format for your next appointment:   In Person  Provider:   Edd Fabian, FNP      Other Instructions CONTACT OFFICE IF ENTRESTO IS TO EXPENSIVE SALTY 6 HANDOUT GIVEN BLOOD PRESSURE LOG GIVEN  Important Information About Sugar

## 2022-10-20 ENCOUNTER — Encounter: Payer: Self-pay | Admitting: Pharmacist

## 2022-10-20 ENCOUNTER — Ambulatory Visit: Payer: No Typology Code available for payment source | Attending: Nurse Practitioner | Admitting: Pharmacist

## 2022-10-20 ENCOUNTER — Other Ambulatory Visit: Payer: Self-pay | Admitting: Physician Assistant

## 2022-10-20 ENCOUNTER — Other Ambulatory Visit: Payer: Self-pay

## 2022-10-20 VITALS — BP 194/142 | HR 84

## 2022-10-20 DIAGNOSIS — I5022 Chronic systolic (congestive) heart failure: Secondary | ICD-10-CM | POA: Diagnosis not present

## 2022-10-20 DIAGNOSIS — I10 Essential (primary) hypertension: Secondary | ICD-10-CM | POA: Diagnosis not present

## 2022-10-20 MED ORDER — ENTRESTO 49-51 MG PO TABS: 1.0000 | ORAL_TABLET | Freq: Two times a day (BID) | ORAL | 3 refills | Status: DC

## 2022-10-20 NOTE — Progress Notes (Signed)
S:     No chief complaint on file.  41 y.o. male who presents for hypertension evaluation, education, and management.  PMH is significant for acute on chronic combined systolic and diastolic HF (last EF 63-01% back on 08/07/2020), HTN, OSA, CKD (last eGFR 46 back on 08/10/2022), preDM, HLD, morbid obesity.   Patient was referred and last seen by [name] on 08/10/2022. BP was 177/140 at that visit, however, non-adherence was suspected. Pt had not taken BP medications prior to that visit. He has since been seen twice by Cardiology. BP showed improvement at those visits and he was started on Entresto given his hx of HFrEF.   Today, patient arrives in good spirits and presents without assistance. Denies dizziness, headache, blurred vision, swelling. Patient reports hypertension was diagnosed is longstanding. Unfortunately, he has not taken his medications this morning and he has not started the [place].   Family/Social history:  -Fhx: DM, HTN -Tobacco: former smoker (quit in 2015) -Alcohol: none reported   Medication adherence reported, however, hasn't taken his medications this morning. He did not get his [employer]. I spoke to our pharmacy today. With his insurance, he must fill with [facility] or he assumes 100% of the cost.   Current antihypertensives include: carvedilol 25 mg BID, BiDil 20-37.5 mg TID, Entresto 49-51 mg BID (not taking), spironolactone 25 mg daily **Of note, he take Lasix for fluid control and Farxiga given his hx of HFrEF.   Reported home BP readings: none  Patient reported dietary habits:  -Tries to restrict sodium and fluids  -No excessive caffeine intake reported  Patient-reported exercise habits: limited d/t dyspnea  O:  Vitals:   10/20/22 1322  BP: (!) 194/142  Pulse: 84    Last 3 Office BP readings: BP Readings from Last 3 Encounters:  10/20/22 (!) 194/142  10/13/22 (!) 150/72  08/24/22 (!) 142/88    BMET    Component Value Date/Time    NA 139 08/10/2022 1016   K 4.4 08/10/2022 1016   CL 103 08/10/2022 1016   CO2 18 (L) 08/10/2022 1016   GLUCOSE 98 08/10/2022 1016   GLUCOSE 113 (H) 07/11/2021 1845   BUN 24 08/10/2022 1016   CREATININE 1.87 (H) 08/10/2022 1016   CREATININE 1.32 10/19/2016 1038   CALCIUM 9.8 08/10/2022 1016   GFRNONAA 52 (L) 07/11/2021 1845   GFRAA 53 (L) 08/12/2020 0548    Renal function: CrCl cannot be calculated (Patient's most recent lab result is older than the maximum 21 days allowed.).  Clinical ASCVD: No  The 10-year ASCVD risk score (Arnett DK, et al., 2019) is: 11.7%   Values used to calculate the score:     Age: 61 years     Sex: Male     Is Non-Hispanic African American: Yes     Diabetic: No     Tobacco smoker: No     Systolic Blood Pressure: 601 mmHg     Is BP treated: Yes     HDL Cholesterol: 48 mg/dL     Total Cholesterol: 231 mg/dL  A/P: Hypertension diagnosed currently uncontrolled on current medications. He is in hypertensive urgency. He is asymptomatic today. BP goal < 130/80 mmHg. Medication adherence appears to be suboptimal. He has not started Entresto and did not take his antihypertensives prior to this visit. I emphasized the significance of his BP elevation. He will takes his antihypertensives when he returns home.  -Continued current regimen. Delene Loll sent to [facility]. I instructed him to call  me next week if this is not delivered to him.  -F/u labs ordered - none today given that he has not started the Entresto.  -Counseled on lifestyle modifications for blood pressure control including reduced dietary sodium, increased exercise, adequate sleep. -Encouraged patient to check BP at home and bring log of readings to next visit. Counseled on proper use of home BP cuff.    Results reviewed and written information provided.    Written patient instructions provided. Patient verbalized understanding of treatment plan.  Total time in face to face counseling 20 minutes.     Follow-up:  Pharmacist prn. PCP clinic visit in 11/10/2022.   Luke Van Ausdall, PharmD, BCACP, CPP Clinical Pharmacist [facility] [phone]   

## 2022-10-20 NOTE — Telephone Encounter (Signed)
Requested Prescriptions  Pending Prescriptions Disp Refills   furosemide (LASIX) 40 MG tablet [Pharmacy Med Name: Furosemide 40 MG Oral Tablet] 90 tablet 0    Sig: TAKE 1 TABLET BY MOUTH DAILY     Cardiovascular:  Diuretics - Loop Failed - 10/20/2022  8:32 AM      Failed - Cr in normal range and within 180 days    Creat  Date Value Ref Range Status  10/19/2016 1.32 0.60 - 1.35 mg/dL Final   Creatinine, Ser  Date Value Ref Range Status  08/10/2022 1.87 (H) 0.76 - 1.27 mg/dL Final   Creatinine, Urine  Date Value Ref Range Status  10/21/2014 240.9 mg/dL Final    Comment:    No reference range established. Performed at [facility]          Failed - Mg Level in normal range and within 180 days    Magnesium  Date Value Ref Range Status  08/12/2020 2.3 1.7 - 2.4 mg/dL Final    Comment:    Performed at [facility], [address], [place], [place] [address]         Failed - Last BP in normal range    BP Readings from Last 1 Encounters:  10/13/22 (!) 150/72         Passed - K in normal range and within 180 days    Potassium  Date Value Ref Range Status  08/10/2022 4.4 3.5 - 5.2 mmol/L Final         Passed - Ca in normal range and within 180 days    Calcium  Date Value Ref Range Status  08/10/2022 9.8 8.7 - 10.2 mg/dL Final   Calcium, Ion  Date Value Ref Range Status  08/10/2020 1.23 1.15 - 1.40 mmol/L Final         Passed - Na in normal range and within 180 days    Sodium  Date Value Ref Range Status  08/10/2022 139 134 - 144 mmol/L Final         Passed - Cl in normal range and within 180 days    Chloride  Date Value Ref Range Status  08/10/2022 103 96 - 106 mmol/L Final         Passed - Valid encounter within last 6 months    Recent Outpatient Visits           Today    [facility] [place], Cornelius Moras, RPH-CPP   2 months ago Essential hypertension, malignant   [place] [address] [facility] [place], Marylene Land M, [place]   9 months ago Hyperglycemia   [facility] [place], [place], [place]   1 year ago Upper respiratory infection, viral   [place] [address] [facility] [place], Shea Stakes, NP   1 year ago Hyperlipidemia LDL goal <130   [facility] [facility] Hoy Register, MD       Future Appointments             In 3 weeks Claiborne Rigg, NP [facility]   In 3 weeks Cleaver, Thomasene Ripple, NP [facility] [address] [facility]

## 2022-10-23 ENCOUNTER — Other Ambulatory Visit: Payer: Self-pay | Admitting: Pharmacist

## 2022-10-23 MED ORDER — VALSARTAN 80 MG PO TABS: 80.0000 mg | ORAL_TABLET | Freq: Every day | ORAL | 3 refills | Status: DC

## 2022-11-10 ENCOUNTER — Ambulatory Visit: Payer: No Typology Code available for payment source | Attending: Nurse Practitioner | Admitting: Nurse Practitioner

## 2022-11-10 ENCOUNTER — Encounter: Payer: Self-pay | Admitting: Nurse Practitioner

## 2022-11-10 VITALS — BP 148/88 | HR 83 | Wt 331.6 lb

## 2022-11-10 DIAGNOSIS — I10 Essential (primary) hypertension: Secondary | ICD-10-CM

## 2022-11-10 NOTE — Progress Notes (Signed)
Assessment & Plan:  Steve Carrillo was seen today for hypertension.  Diagnoses and all orders for this visit:  Primary hypertension -     CMP14+EGFR Increased valsartan to 163m daily today. Continue all antihypertensives as prescribed.  Reminded to bring in blood pressure log for follow  up appointment.  RECOMMENDATIONS: DASH/Mediterranean Diets are healthier choices for HTN.    Patient has been counseled on age-appropriate routine health concerns for screening and prevention. These are reviewed and up-to-date. Referrals have been placed accordingly. Immunizations are up-to-date or declined.    Subjective:   Chief Complaint  Patient presents with   Hypertension   HPI Steve Carrillo 41y.o. male presents to office today for follow-up to hypertension.  HTN Manual BP reading today: 152/100. Will be increasing valsartan to 160 mg. He could not afford entresto due to copay of >$300. He endorses adherence taking carvedilol 25 mg BID, bidil 20-37.5 mg TID and spironolactone 25 mg daily.  BP Readings from Last 3 Encounters:  11/10/22 (!) 148/88  10/20/22 (!) 194/142  10/13/22 (!) 150/72    Review of Systems  Constitutional:  Negative for fever, malaise/fatigue and weight loss.  HENT: Negative.  Negative for nosebleeds.   Eyes: Negative.  Negative for blurred vision, double vision and photophobia.  Respiratory: Negative.  Negative for cough and shortness of breath.   Cardiovascular: Negative.  Negative for chest pain, palpitations and leg swelling.  Gastrointestinal: Negative.  Negative for heartburn, nausea and vomiting.  Musculoskeletal: Negative.  Negative for myalgias.  Neurological: Negative.  Negative for dizziness, focal weakness, seizures and headaches.  Psychiatric/Behavioral: Negative.  Negative for suicidal ideas.     Past Medical History:  Diagnosis Date   Cardiomyopathy due to hypertension ([facility] 10/2014   NICM EF 35-40%, global LV strain is abnormal at -10.9%   [place]  [place]    seen on previous echo 02/2016   Hyperlipidemia LDL goal <130 2015   Hypertension    Prediabetes     Past Surgical History:  Procedure Laterality Date   HERNIA REPAIR     LEFT HEART CATHETERIZATION WITH CORONARY ANGIOGRAM N/A 10/22/2014   Procedure: LEFT HEART CATHETERIZATION WITH CORONARY ANGIOGRAM;  Surgeon: Peter M JMartinique MD; Normal coronary anatomy. Vessels are very large due to hypertensive cardiomyopathy.    RIGHT/LEFT HEART CATH AND CORONARY ANGIOGRAPHY N/A 08/10/2020   Procedure: RIGHT/LEFT HEART CATH AND CORONARY ANGIOGRAPHY;  Surgeon: JMartinique Peter M, MD;  [facility];  Service: Cardiovascular;  Laterality: N/A;    Family History  Problem Relation Age of Onset   Diabetes Mellitus II Mother    Hypertension Mother    Hypertension Brother    Diabetes Mellitus II Maternal Grandmother     Social History Reviewed with no changes to be made today.   Outpatient Medications Prior to Visit  Medication Sig Dispense Refill   atorvastatin (LIPITOR) 40 MG tablet Take 1 tablet (40 mg total) by mouth daily. 90 tablet 1   benzonatate (TESSALON) 100 MG capsule Take 1 capsule (100 mg total) by mouth 3 (three) times daily as needed for cough. 21 capsule 0   carvedilol (COREG) 25 MG tablet Take 1 tablet (25 mg total) by mouth 2 (two) times daily. 180 tablet 1   dapagliflozin propanediol (FARXIGA) 5 MG TABS tablet Take 1 tablet (5 mg total) by mouth daily before breakfast. 90 tablet 1   furosemide (LASIX) 40 MG tablet TAKE 1 TABLET BY MOUTH DAILY 90 tablet 0  isosorbide-hydrALAZINE (BIDIL) 20-37.5 MG tablet Take 1 tablet by mouth 3 (three) times daily. 270 tablet 1   methocarbamol (ROBAXIN) 500 MG tablet Take 2 tablets (1,000 mg total) by mouth every 8 (eight) hours as needed for muscle spasms. 90 tablet 0   spironolactone (ALDACTONE) 25 MG tablet Take 1 tablet (25 mg total) by mouth daily. 90 tablet 1   valsartan (DIOVAN) 80 MG tablet Take 1 tablet (80 mg  total) by mouth daily. (Patient taking differently: Take 160 mg by mouth daily.) 90 tablet 3   No facility-administered medications prior to visit.    No Known Allergies     Objective:    BP (!) 148/88   Pulse 83   Wt (!) 331 lb 9.6 oz (150.4 kg)   SpO2 96%   BMI 43.75 kg/m  Wt Readings from Last 3 Encounters:  11/10/22 (!) 331 lb 9.6 oz (150.4 kg)  10/13/22 (!) 326 lb 12.8 oz (148.2 kg)  08/24/22 (!) 316 lb 6.4 oz (143.5 kg)    Physical Exam Vitals and nursing note reviewed.  Constitutional:      Appearance: He is well-developed.  HENT:     Head: Normocephalic and atraumatic.  Cardiovascular:     Rate and Rhythm: Normal rate and regular rhythm.     Heart sounds: Normal heart sounds. No murmur heard.    No friction rub. No gallop.  Pulmonary:     Effort: Pulmonary effort is normal. No tachypnea or respiratory distress.     Breath sounds: Normal breath sounds. No decreased breath sounds, wheezing, rhonchi or rales.  Chest:     Chest wall: No tenderness.  Abdominal:     General: Bowel sounds are normal.     Palpations: Abdomen is soft.  Musculoskeletal:        General: Normal range of motion.     Cervical back: Normal range of motion.  Skin:    General: Skin is warm and dry.  Neurological:     Mental Status: He is alert and oriented to person, place, and time.     Coordination: Coordination normal.  Psychiatric:        Behavior: Behavior normal. Behavior is cooperative.        Thought Content: Thought content normal.        Judgment: Judgment normal.          Patient has been counseled extensively about nutrition and exercise as well as the importance of adherence with medications and regular follow-up. The patient was given clear instructions to go to ER or return to medical center if symptoms don't improve, worsen or new problems develop. The patient verbalized understanding.   Follow-up: Return in 14 weeks (on 02/16/2023).   [name], FNP-BC [facility] [place], [place]   11/10/2022, 11:17 AM

## 2022-11-11 ENCOUNTER — Other Ambulatory Visit: Payer: Self-pay | Admitting: Nurse Practitioner

## 2022-11-11 DIAGNOSIS — I129 Hypertensive chronic kidney disease with stage 1 through stage 4 chronic kidney disease, or unspecified chronic kidney disease: Secondary | ICD-10-CM

## 2022-11-11 LAB — CMP14+EGFR
ALT: 25 IU/L (ref 0–44)
AST: 22 IU/L (ref 0–40)
Albumin/Globulin Ratio: 1.6 (ref 1.2–2.2)
Albumin: 4.3 g/dL (ref 4.1–5.1)
Alkaline Phosphatase: 92 IU/L (ref 44–121)
BUN/Creatinine Ratio: 16 (ref 9–20)
BUN: 29 mg/dL — ABNORMAL HIGH (ref 6–24)
Bilirubin Total: 0.4 mg/dL (ref 0.0–1.2)
CO2: 21 mmol/L (ref 20–29)
Calcium: 9.7 mg/dL (ref 8.7–10.2)
Chloride: 104 mmol/L (ref 96–106)
Creatinine, Ser: 1.85 mg/dL — ABNORMAL HIGH (ref 0.76–1.27)
Globulin, Total: 2.7 g/dL (ref 1.5–4.5)
Glucose: 124 mg/dL — ABNORMAL HIGH (ref 70–99)
Potassium: 4.4 mmol/L (ref 3.5–5.2)
Sodium: 141 mmol/L (ref 134–144)
Total Protein: 7 g/dL (ref 6.0–8.5)
eGFR: 47 mL/min/{1.73_m2} — ABNORMAL LOW (ref >59)

## 2022-11-13 ENCOUNTER — Telehealth: Payer: Self-pay

## 2022-11-13 NOTE — Telephone Encounter (Signed)
Pt was called and is aware of results, DOB was confirmed.  ?

## 2022-11-13 NOTE — Telephone Encounter (Signed)
-----   Message from Claiborne Rigg, NP sent at 11/11/2022  7:03 PM EST ----- Kidney function still shows slight decline. Referring to nephrology. They will call him to schedule.

## 2022-11-14 NOTE — Progress Notes (Deleted)
Cardiology Clinic Note   Patient Name: Steve Carrillo Date of Encounter: 11/14/2022  Primary Care Provider:  Claiborne Rigg, NP Primary Cardiologist:  Thurmon Fair, MD  Patient Profile    Steve Carrillo 41 year old male presents the clinic today for follow-up evaluation of his essential hypertension and cardiomyopathy.  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 Vessels are very large due to hypertensive cardiomyopathy.    RIGHT/LEFT HEART CATH AND CORONARY ANGIOGRAPHY N/A 08/10/2020   Procedure: RIGHT/LEFT HEART CATH AND CORONARY ANGIOGRAPHY;  Surgeon: Swaziland, Peter M, MD;  Location: [facility];  Service: Cardiovascular;  Laterality: N/A;    Allergies  No Known Allergies  History of Present Illness    Steve Carrillo has a PMH of acute on chronic combined systolic and diastolic CHF, HTN, OSA, CKD stage II, prolonged QT interval, HLD, morbid obesity, abnormal chest CT and hypokalemia.  His PMH also includes noncompliance.  He underwent cardiac cath in 2015 which showed no CAD  He was last seen by cardiology during his admission 9/21.  He had been admitted for severe hypertension and CHF exacerbation.  He had not taken medications in 2 months.  His EF was noted to be 35-40 in 2017.  During his admission he was noted to have an EF of 20-25% with G3 DD.  He was restarted on home medications with plan for up titration.  His blood pressures at discharge were 130s  over 80s.  His cholesterol panel 08/06/2020 showed a total cholesterol 149, HDL 27, LDL 107, triglycerides 74.  He was encouraged to increase his physical activity.  He was noted to have CKD stage II with a creatinine of 1.49 which was at his baseline.  He was lost to follow-up.  He presented to the clinic 08/24/22 for follow-up evaluation stated he felt fairly well.  He continued to work 2 jobs.  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  His blood pressure was 148/94 and on recheck is 142/88.  We reviewed his medications and he was taking appropriately.  He reported that he was unable to afford his Sherryll Burger and that it would cost over $1000 per month.  He was in the process of finishing Entresto and starting valsartan.  He was stable from a cardiac standpoint .  I  put in a social work consult, gave weight log, gave blood pressure log, gave salty 6 diet information, asked him to maintain his physical activity and planned follow-up in 3.  Social work was consulted during visit.  We were able to give patient a co-pay card.  This made his Entresto $10.  I  uptitrated his Entresto to 49/51.  Ordered a BMP and 1 week after initiation, planned follow-up for 1-2 months  and continued previous recommendations.  BMP noted slightly elevated creatinine on recheck.  He presented to the clinic 10/13/22 for follow-up evaluation stated he  continued to be physically active working  2 jobs.  We  reviewed his medications.  He reported that he had been out of valsartan and did not start Entresto.  We reviewed the importance of medication compliance and the importance of good blood pressure control with his heart failure.  He expressed understanding.  He had been trying to avoid salt in his diet.  I  instructed him to contact the office if he had trouble affording the medication or obtaining it.  I  reordered Entresto, order BMP 1 week after starting, and planned follow-up in 1 month.  He presents to the  clinic today for follow-up evaluation states***  Today he denies chest pain, shortness of breath, lower extremity edema, fatigue, palpitations, melena, hematuria, hemoptysis, diaphoresis, weakness, presyncope, syncope, orthopnea, and PND.   Acute on chronic systolic and diastolic CHF-stable.  NYHA class I-2.  Previous EF noted to be 20-25% with G3 DD.  Follow-up lab work on 11/10/2022 showed creatinine with slight improvement of 1.85.  PCP referred to nephrology. Continue carvedilol, Farxiga, furosemide, isosorbide, hydralazine, spironolactone Continue Entresto 49/51 Heart healthy low-sodium diet Increase physical activity as tolerated Plan for repeat echocardiogram once GDMT has been optimized.-1 month after Maintain blood pressure log Maintain weight log Order echocardiogram  Essential hypertension-BP today ***160/109 and on recheck 150/72.  ***Continues to be noncompliant with medications.   We were able to get the patient a insurance co-pay card.  This maked his Entresto $10 per month.  He is now following with nephrology. Continue carvedilol, furosemide, isosorbide, hydralazine, spironolactone,  Entresto Heart healthy low-sodium diet  Increase physical activity as tolerated Maintain blood pressure log Follows with nephrology  Morbid obesity-weight today 326.8*** lbs. Increase physical activity  Heart healthy low-sodium diet-reports he is trying to avoid salt.   Disposition: Follow-up with Dr. Royann Shiversroitoru or me in 2-3 months.  Home Medications    Prior to Admission medications   Medication Sig Start Date End Date Taking? Authorizing Provider  atorvastatin (LIPITOR) 40 MG tablet Take 1 tablet (40 mg total) by mouth daily. 08/10/22   Anders SimmondsMcClung, Angela M, PA-C  benzonatate (TESSALON) 100 MG capsule Take 1 capsule (100 mg total) by mouth 3 (three) times daily as needed for cough. Patient not taking: Reported on 08/10/2022 07/11/21   Fayrene Helperran, Bowie, PA-C  carvedilol (COREG) 25 MG tablet Take 1  tablet (25 mg total) by mouth 2 (two) times daily. 08/10/22   Anders SimmondsMcClung, Angela M, PA-C  dapagliflozin propanediol (FARXIGA) 5 MG TABS tablet Take 1 tablet (5 mg total) by mouth daily before breakfast. 08/10/22   Anders SimmondsMcClung, Angela M, PA-C  furosemide (LASIX) 40 MG tablet Take 1 tablet (40 mg total) by mouth daily. 08/10/22 11/08/22  Anders SimmondsMcClung, Angela M, PA-C  isosorbide-hydrALAZINE (BIDIL) 20-37.5 MG tablet Take 1 tablet by mouth 3 (three) times daily. 08/10/22   Anders SimmondsMcClung, Angela M, PA-C  methocarbamol (ROBAXIN) 500 MG tablet Take 2 tablets (1,000 mg total) by mouth every 8 (eight) hours as needed for muscle spasms. 08/10/22   Anders SimmondsMcClung, Angela M, PA-C  spironolactone (ALDACTONE) 25 MG tablet Take 1 tablet (25 mg total) by mouth daily. 08/10/22   Anders SimmondsMcClung, Angela M, PA-C  valsartan (DIOVAN) 80 MG tablet Take 1 tablet (80 mg total) by mouth daily. 08/18/22   Claiborne RiggFleming, Steve W, NP    Family History    Family History  Problem Relation Age of Onset   Diabetes Mellitus II Mother    Hypertension Mother    Hypertension Brother  Diabetes Mellitus II Maternal Grandmother    He indicated that his mother is alive. He indicated that his father is alive. He indicated that his sister is alive. He indicated that both of his brothers are alive. He indicated that his maternal grandmother is alive.  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    Social Determinants of Health   Financial Resource Strain: Not on file  Food Insecurity: Not on file  Transportation Needs: Not on file  Physical Activity: Not on file  Stress: Not on file  Social Connections: Not on file  Intimate Partner Violence: Not on file     Review of Systems    General:  No chills, fever, night sweats or weight changes.  Cardiovascular:  No chest pain, dyspnea on exertion, generalized lower extremity nonpitting edema, orthopnea, palpitations, paroxysmal nocturnal dyspnea. Dermatological: No rash, lesions/masses Respiratory: No cough, dyspnea Urologic: No hematuria, dysuria Abdominal:   No nausea, vomiting, diarrhea, bright red blood per rectum, melena, or hematemesis Neurologic:  No visual changes, wkns, changes in mental status. All other systems reviewed and are otherwise negative except as noted above.  Physical Exam    VS:  There were no vitals taken for this visit. , BMI There is no height or weight on file to calculate BMI. GEN: Well nourished, well developed, in no acute distress. HEENT: normal. Neck: Supple, no JVD, carotid bruits, or masses. Cardiac: RRR, no murmurs, rubs, or gallops. No clubbing, cyanosis, edema.  Radials/DP/PT 2+ and equal bilaterally.  Respiratory:  Respirations regular and unlabored, clear to auscultation bilaterally. GI: Soft, nontender, nondistended, BS + x 4. MS: no deformity or atrophy. Skin: warm and dry, no rash. Neuro:  Strength and sensation are intact. Psych: Normal affect.  Accessory Clinical Findings    Recent Labs: 08/10/2022: Hemoglobin 14.3; Platelets 330 11/10/2022: ALT 25; BUN 29; Creatinine, Ser 1.85; Potassium 4.4; Sodium 141   Recent Lipid Panel    Component Value Date/Time   CHOL 231 (H) 08/10/2022 1016   TRIG 78 08/10/2022 1016   HDL 48 08/10/2022 1016   CHOLHDL 4.8 08/10/2022 1016    CHOLHDL 5.5 08/06/2020 1410   VLDL 15 08/06/2020 1410   LDLCALC 169 (H) 08/10/2022 1016   LDLDIRECT 184.0 08/21/2019 0848    No BP recorded.  {Refresh Note OR Click here to enter BP  :1}***    ECG personally reviewed by me today-none today.   EKG 08/24/2022 sinus rhythm with first-degree AV block biatrial enlargement LVH 91 bpm  Echocardiogram 08/07/2020  IMPRESSIONS     1. Left ventricular ejection fraction, by estimation, is 20 to 25%.  The  left ventricle has severely decreased function. The left ventricle  demonstrates global hypokinesis. The left ventricular internal cavity size  was severely dilated. There is mild  left ventricular hypertrophy. Left ventricular diastolic parameters are  consistent with Grade III diastolic dysfunction (restrictive). Elevated  left atrial pressure.   2. Right ventricular systolic function is normal. The right ventricular  size is normal. Tricuspid regurgitation signal is inadequate for assessing  PA pressure.   3. The mitral valve is normal in structure. Mild mitral valve  regurgitation.   4. The aortic valve was not well visualized. Aortic valve regurgitation  is mild. No aortic stenosis is present.   5. Aortic dilatation noted. There is mild dilatation of the aortic root  measuring 40 mm.   6. The inferior vena cava is dilated in size with <50% respiratory  variability, suggesting right atrial pressure of 15 mmHg.  Assessment & Plan   1.  ***  Thomasene Ripple. Adon Gehlhausen NP-C     11/14/2022, 6:03 AM [facility] [address] Office [phone] Fax [phone]  Notice: This dictation was prepared with Dragon dictation along with smaller phrase technology. Any transcriptional errors that result from this process are unintentional and may not be corrected upon review.  I spent 14*** minutes examining this patient, reviewing medications, and using patient centered shared decision making involving  her cardiac care.  Prior to her visit I spent greater than 20 minutes reviewing her past medical history,  medications, and prior cardiac tests.

## 2022-11-15 ENCOUNTER — Ambulatory Visit: Payer: No Typology Code available for payment source | Admitting: General Practice

## 2022-12-26 NOTE — Progress Notes (Unsigned)
Cardiology Clinic Note   Patient Name: Steve Carrillo Date of Encounter: 12/27/2022  Primary Care Provider:  [name], NP Primary Cardiologist:  Sanda Klein, MD  Patient Profile    Steve Carrillo 42 year old male presents the clinic today for follow-up evaluation of his essential hypertension and cardiomyopathy.  Past Medical History    Past Medical History:  Diagnosis Date   Cardiomyopathy due to hypertension ([place]) 10/2014   NICM EF 35-40%, global LV strain is abnormal at -10.9%   Dilated aortic root ([place])    seen on previous echo 02/2016   Hyperlipidemia LDL goal <130 2015   Hypertension    Prediabetes    Past Surgical History:  Procedure Laterality Date   HERNIA REPAIR     LEFT HEART CATHETERIZATION WITH CORONARY ANGIOGRAM N/A 10/22/2014   Procedure: LEFT HEART CATHETERIZATION WITH CORONARY ANGIOGRAM;  Surgeon: Peter M Martinique, MD; Normal coronary anatomy. Vessels are very large due to hypertensive cardiomyopathy.    RIGHT/LEFT HEART CATH AND CORONARY ANGIOGRAPHY N/A 08/10/2020   Procedure: RIGHT/LEFT HEART CATH AND CORONARY ANGIOGRAPHY;  Surgeon: Martinique, Peter M, MD;  Location: [facility];  Service: Cardiovascular;  Laterality: N/A;    Allergies  No Known Allergies  History of Present Illness    Steve Carrillo has a PMH of acute on chronic combined systolic and diastolic CHF, HTN, OSA, CKD stage II, prolonged QT interval, HLD, morbid obesity, abnormal chest CT and hypokalemia.  His PMH also includes noncompliance.  He underwent cardiac cath in 2015 which showed no CAD  He was last seen by cardiology during his admission 9/21.  He had been admitted for severe hypertension and CHF exacerbation.  He had not taken medications in 2 months.  His EF was noted to be 35-40 in 2017.  During his admission he was noted to have an EF of 20-25% with G3 DD.  He was restarted on home medications with plan for up titration.  His blood pressures at discharge were 130s  over 80s.  His cholesterol panel 08/06/2020 showed a total cholesterol 149, HDL 27, LDL 107, triglycerides 74.  He was encouraged to increase his physical activity.  He was noted to have CKD stage II with a creatinine of 1.49 which was at his baseline.  He was lost to follow-up.  He presented to the clinic 08/24/22 for follow-up evaluation stated he felt fairly well.  He continued to work 2 jobs.  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  His blood pressure was 148/94 and on recheck is 142/88.  We reviewed his medications and he was taking appropriately.  He reported that he was unable to afford his Delene Loll and that it would cost over $1000 per month.  He was in the process of finishing Entresto and starting valsartan.  He was stable from a cardiac standpoint .  I  put in a social work consult, gave weight log, gave blood pressure log, gave salty 6 diet information, asked him to maintain his physical activity and planned follow-up in 3.  Social work was consulted during visit.  We were able to give patient a co-pay card.  This made his Entresto $10.  I  uptitrated his Entresto to 49/51.  Ordered a BMP and 1 week after initiation, planned follow-up for 1-2 months  and continued previous recommendations.  BMP noted slightly elevated creatinine on recheck.  He presented to the clinic 10/13/2022 for follow-up evaluation stated he  continued to be physically active working his 2 jobs.  We again reviewed his medications.  He reported that he had been out of valsartan and did not start Entresto.  We reviewed the importance of medication compliance and the importance of good blood pressure control with his heart failure.  He expressed understanding.  He had been trying to avoid salt in his diet.  I instructed him to contact the office if he has trouble affording the medication or obtaining it.  I  reorder Delene Loll, ordered BMP 1 week after starting, and follow-up in 1 month.  He was seen and  evaluated by his PCP.  His 1223 labs showed continued elevated creatinine.  He was referred to nephrology.  He presents to the clinic today for follow-up evaluation and states he feels well.  He did note some nonexertional chest discomfort this morning.  It appears to be noncardiac/atypical in nature.  He reports compliance with his medications.  His blood pressure is much better controlled.  Initially today it is 130/90 and on recheck is 136/86.  We reviewed the importance of maintaining p.o. hydration, medication compliance, and losing weight.  He expressed understanding.  I will increase his carvedilol to 37.5 twice daily, order an echocardiogram in 1 month, refer him to [facility] health weight and wellness, and plan follow-up after echocardiogram.  We reviewed his most recent lab work from his PCP who recommended follow-up with nephrology.  I asked him to contact their office for further direction.  He reports that he is planning to go back to the gym to increase his physical activity.  Today he denies chest pain, shortness of breath, lower extremity edema, fatigue, palpitations, melena, hematuria, hemoptysis, diaphoresis, weakness, presyncope, syncope, orthopnea, and PND.    Home Medications    Prior to Admission medications   Medication Sig Start Date End Date Taking? Authorizing Provider  atorvastatin (LIPITOR) 40 MG tablet Take 1 tablet (40 mg total) by mouth daily. 08/10/22   Argentina Donovan, PA-C  benzonatate (TESSALON) 100 MG capsule Take 1 capsule (100 mg total) by mouth 3 (three) times daily as needed for cough. Patient not taking: Reported on 08/10/2022 07/11/21   Domenic Moras, PA-C  carvedilol (COREG) 25 MG tablet Take 1 tablet (25 mg total) by mouth 2 (two) times daily. 08/10/22   Argentina Donovan, PA-C  dapagliflozin propanediol (FARXIGA) 5 MG TABS tablet Take 1 tablet (5 mg total) by mouth daily before breakfast. 08/10/22   Argentina Donovan, PA-C  furosemide (LASIX) 40 MG tablet Take 1 tablet  (40 mg total) by mouth daily. 08/10/22 11/08/22  Argentina Donovan, PA-C  isosorbide-hydrALAZINE (BIDIL) 20-37.5 MG tablet Take 1 tablet by mouth 3 (three) times daily. 08/10/22   Argentina Donovan, PA-C  methocarbamol (ROBAXIN) 500 MG tablet Take 2 tablets (1,000 mg total) by mouth every 8 (eight) hours as needed for muscle spasms. 08/10/22   Argentina Donovan, PA-C  spironolactone (ALDACTONE) 25 MG tablet Take 1 tablet (25 mg total) by mouth daily. 08/10/22   Argentina Donovan, PA-C  valsartan (DIOVAN) 80 MG tablet Take 1 tablet (80 mg total) by mouth daily. 08/18/22   [name], NP    Family History    Family History  Problem Relation Age of Onset   Diabetes Mellitus II Mother    Hypertension Mother    Hypertension Brother    Diabetes Mellitus II Maternal Grandmother    He indicated that his mother is alive. He indicated that  his father is alive. He indicated that his sister is alive. He indicated that both of his brothers are alive. He indicated that his maternal grandmother is alive.  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    Social Determinants of Health   Financial Resource Strain: Not on file  Food Insecurity: Not on file  Transportation Needs: Not on file  Physical Activity: Not  on file  Stress: Not on file  Social Connections: Not on file  Intimate Partner Violence: Not on file     Review of Systems    General:  No chills, fever, night sweats or weight changes.  Cardiovascular:  No chest pain, dyspnea on exertion, generalized lower extremity nonpitting edema, orthopnea, palpitations, paroxysmal nocturnal dyspnea. Dermatological: No rash, lesions/masses Respiratory: No cough, dyspnea Urologic: No hematuria, dysuria Abdominal:   No nausea, vomiting, diarrhea, bright red blood per rectum, melena, or hematemesis Neurologic:  No visual changes, wkns, changes in mental status. All other systems reviewed and are otherwise negative except as noted above.  Physical Exam    VS:  BP 136/86   Pulse 80   Ht 6\' 1"  (1.854 m)   Wt (!) 333 lb (151 kg)   SpO2 92%   BMI 43.93 kg/m  , BMI Body mass index is 43.93 kg/m. GEN: Well nourished, well developed, in no acute distress. HEENT: normal. Neck: Supple, no JVD, carotid bruits, or masses. Cardiac: RRR, no murmurs, rubs, or gallops. No clubbing, cyanosis, edema.  Radials/DP/PT 2+ and equal bilaterally.  Respiratory:  Respirations regular and unlabored, clear to auscultation bilaterally. GI: Soft, nontender, nondistended, BS + x 4. MS: no deformity or atrophy. Skin: warm and dry, no rash. Neuro:  Strength and sensation are intact. Psych: Normal affect.  Accessory Clinical Findings    Recent Labs: 08/10/2022: Hemoglobin 14.3; Platelets 330 11/10/2022: ALT 25; BUN 29; Creatinine, Ser 1.85; Potassium 4.4; Sodium 141   Recent Lipid Panel    Component Value Date/Time   CHOL 231 (H) 08/10/2022 1016   TRIG 78 08/10/2022 1016   HDL 48 08/10/2022 1016   CHOLHDL 4.8 08/10/2022 1016   CHOLHDL 5.5 08/06/2020 1410   VLDL 15 08/06/2020 1410   LDLCALC 169 (H) 08/10/2022 1016   LDLDIRECT 184.0 08/21/2019 0848         ECG personally reviewed by me today-none today.   EKG 08/24/2022 sinus rhythm with first-degree AV  block biatrial enlargement LVH 91 bpm  Echocardiogram 08/07/2020  IMPRESSIONS     1. Left ventricular ejection fraction, by estimation, is 20 to 25%. The  left ventricle has severely decreased function. The left ventricle  demonstrates global hypokinesis. The left ventricular internal cavity size  was severely dilated. There is mild  left ventricular hypertrophy. Left ventricular diastolic parameters are  consistent with Grade III diastolic dysfunction (restrictive). Elevated  left atrial pressure.   2. Right ventricular systolic function is normal. The right ventricular  size is normal. Tricuspid regurgitation signal is inadequate for assessing  PA pressure.   3. The mitral valve is normal in structure. Mild mitral valve  regurgitation.   4. The aortic valve was not well visualized. Aortic valve regurgitation  is mild. No aortic stenosis is present.   5. Aortic dilatation noted. There is mild dilatation of the aortic root  measuring 40 mm.   6. The inferior vena cava is dilated in size with <50% respiratory  variability, suggesting right atrial pressure of 15 mmHg.  Assessment & Plan   1.  Acute on chronic systolic and diastolic CHF-euvolemic.  NYHA class I-2.  Previous EF noted to be 20-25% with G3 DD.  Continues to be physically active working 2 separate jobs.  Patient was on able to afford Entresto. Continue carvedilol, Farxiga, furosemide, isosorbide, hydralazine, spironolactone PCP increased valsartan to 160 mg daily. Increase carvedilol to 37.5 twice daily BMP drawn on 11/10/2022 showed creatinine at 1.85.  He was referred to nephrology by his PCP Heart healthy low-sodium diet-reviewed Repeat echocardiogram  Continue to maintain blood pressure log Continue weight log  Essential hypertension-BP today 130/90 and on recheck 136/86.  Unable to afford Entresto. Continue  furosemide, isosorbide, hydralazine, spironolactone, valsartan Increase coreg to 37.5 bid Heart healthy  low-sodium diet  Increase physical activity as tolerated  Morbid obesity-weight today 333 lbs. Increase physical activity  Calorie restricted diet Continue weight loss [facility]  Disposition: Follow-up with Dr. Royann Shivers after echocardiogram.   Thomasene Ripple. Sitlali Koerner NP-C     12/27/2022, 10:02 AM [facility] [address] Office [phone] Fax [phone]  Notice: This dictation was prepared with Dragon dictation along with smaller phrase technology. Any transcriptional errors that result from this process are unintentional and may not be corrected upon review.  I spent 14 minutes examining this patient, reviewing medications, and using patient centered shared decision making involving her cardiac care.  Prior to her visit I spent greater than 20 minutes reviewing her past medical history,  medications, and prior cardiac tests.

## 2022-12-27 ENCOUNTER — Encounter: Payer: Self-pay | Admitting: General Practice

## 2022-12-27 ENCOUNTER — Ambulatory Visit: Payer: No Typology Code available for payment source | Attending: General Practice | Admitting: General Practice

## 2022-12-27 ENCOUNTER — Other Ambulatory Visit: Payer: Self-pay

## 2022-12-27 VITALS — BP 136/86 | HR 80 | Ht 73.0 in | Wt 333.0 lb

## 2022-12-27 DIAGNOSIS — I5043 Acute on chronic combined systolic (congestive) and diastolic (congestive) heart failure: Secondary | ICD-10-CM

## 2022-12-27 DIAGNOSIS — I10 Essential (primary) hypertension: Secondary | ICD-10-CM | POA: Diagnosis not present

## 2022-12-27 DIAGNOSIS — I5022 Chronic systolic (congestive) heart failure: Secondary | ICD-10-CM | POA: Diagnosis not present

## 2022-12-27 MED ORDER — CARVEDILOL 25 MG PO TABS
37.5000 mg | ORAL_TABLET | Freq: Two times a day (BID) | ORAL | 1 refills | Status: DC
  Filled 2022-12-27: qty 90, 30d supply, fill #0

## 2022-12-27 NOTE — Patient Instructions (Signed)
Medication Instructions:  INCREASE CARVEDILOL 37.5 TWICE DAILY *If you need a refill on your cardiac medications before your next appointment, please call your pharmacy*  Lab Work: NONE If you have labs (blood work) drawn today and your tests are completely normal, you will receive your results only by:   [place] (if you have MyChart) OR  A paper copy in the mail  If you have any lab test that is abnormal or we need to change your treatment, we will call you to review the results.  Testing/Procedures: Echocardiogram -(IN 1 MONTH) Your physician has requested that you have an echocardiogram. Echocardiography is a painless test that uses sound waves to create images of your heart. It provides your doctor with information about the size and shape of your heart and how well your heart's chambers and valves are working. This procedure takes approximately one hour. There are no restrictions for this procedure.   Other Instructions REFER TO HEALTHY WEIGHT AND WELLNESS   Follow-Up: At [facility], you and your health needs are our priority.  As part of our continuing mission to provide you with exceptional heart care, we have created designated Provider Care Teams.  These Care Teams include your primary Cardiologist (physician) and Advanced Practice Providers (APPs -  Physician Assistants and Nurse Practitioners) who all work together to provide you with the care you need, when you need it.  We recommend signing up for the patient portal called "MyChart".  Sign up information is provided on this After Visit Summary.  MyChart is used to connect with patients for Virtual Visits (Telemedicine).  Patients are able to view lab/test results, encounter notes, upcoming appointments, etc.  Non-urgent messages can be sent to your provider as well.   To learn more about what you can do with MyChart, go to [email or web address].    Your next appointment:   AFTER ECHO/  1st  AVAILABLE  Provider:   Sanda Klein, MD

## 2023-01-03 ENCOUNTER — Other Ambulatory Visit: Payer: Self-pay

## 2023-01-10 ENCOUNTER — Other Ambulatory Visit: Payer: Self-pay | Admitting: Nurse Practitioner

## 2023-01-10 NOTE — Telephone Encounter (Signed)
Medication Refill - Medication: valsartan (DIOVAN) 80 MG tablet   *according to visit on 11/10/22 dose was supposed to be increased to 160mg *  Has the patient contacted their pharmacy? No. (Agent: If no, request that the patient contact the pharmacy for the refill. If patient does not wish to contact the pharmacy document the reason why and proceed with request.) (Agent: If yes, when and what did the pharmacy advise?)  Preferred Pharmacy (with phone number or street name):  [facility], [place] Phone: [phone]  Fax: [phone]     Has the patient been seen for an appointment in the last year OR does the patient have an upcoming appointment? Yes.    Agent: Please be advised that RX refills may take up to 3 business days. We ask that you follow-up with your pharmacy.

## 2023-01-11 NOTE — Telephone Encounter (Signed)
Unable to refill per protocol, Rx request is too soon. Last refill 10/23/22 for 90 and 3 refills.  Requested Prescriptions  Pending Prescriptions Disp Refills   valsartan (DIOVAN) 80 MG tablet      Sig: Take 2 tablets (160 mg total) by mouth daily.     Cardiovascular:  Angiotensin Receptor Blockers Failed - 01/10/2023 11:47 AM      Failed - Cr in normal range and within 180 days    Creat  Date Value Ref Range Status  10/19/2016 1.32 0.60 - 1.35 mg/dL Final   Creatinine, Ser  Date Value Ref Range Status  11/10/2022 1.85 (H) 0.76 - 1.27 mg/dL Final   Creatinine, Urine  Date Value Ref Range Status  10/21/2014 240.9 mg/dL Final    Comment:    No reference range established. Performed at [place] in normal range and within 180 days    Potassium  Date Value Ref Range Status  11/10/2022 4.4 3.5 - 5.2 mmol/L Final         Passed - Patient is not pregnant      Passed - Last BP in normal range    BP Readings from Last 1 Encounters:  12/27/22 136/86         Passed - Valid encounter within last 6 months    Recent Outpatient Visits           2 months ago Primary hypertension   [place], [place] W, NP   2 months ago Chronic systolic congestive heart failure [facility])   [place], Jarome Matin, RPH-CPP   5 months ago Essential hypertension, malignant   [place], [place], [place]   1 year ago Hyperglycemia   [place], [place]   1 year ago Upper respiratory infection, viral   [place], Vernia Buff, NP       Future Appointments             In 3 weeks Croitoru, Dani Gobble, MD [place] [facility]   In 1 month [name], NP [place]

## 2023-01-17 ENCOUNTER — Ambulatory Visit: Payer: Self-pay

## 2023-01-17 ENCOUNTER — Other Ambulatory Visit: Payer: Self-pay

## 2023-01-17 ENCOUNTER — Emergency Department (HOSPITAL_BASED_OUTPATIENT_CLINIC_OR_DEPARTMENT_OTHER)
Admission: EM | Admit: 2023-01-17 | Discharge: 2023-01-18 | Disposition: A | Discharge status: Home / Self Care | Payer: No Typology Code available for payment source | Attending: Emergency Medicine | Admitting: Emergency Medicine

## 2023-01-17 DIAGNOSIS — I10 Essential (primary) hypertension: Secondary | ICD-10-CM | POA: Diagnosis not present

## 2023-01-17 DIAGNOSIS — J069 Acute upper respiratory infection, unspecified: Secondary | ICD-10-CM | POA: Insufficient documentation

## 2023-01-17 DIAGNOSIS — Z1152 Encounter for screening for COVID-19: Secondary | ICD-10-CM | POA: Insufficient documentation

## 2023-01-17 DIAGNOSIS — Z79899 Other long term (current) drug therapy: Secondary | ICD-10-CM | POA: Diagnosis not present

## 2023-01-17 DIAGNOSIS — R0981 Nasal congestion: Secondary | ICD-10-CM | POA: Diagnosis present

## 2023-01-17 NOTE — Telephone Encounter (Signed)
Pt stated he has been experiencing a cough, congestion since Monday or Tuesday. Pt is requesting an appointment to see PCP and a work note.  No appointments availble.   Pt seeking clinical advice.   Chief Complaint: Cough, sinus congestion. No availability  today, asking to be worked in. Symptoms:  Frequency: Monday Pertinent Negatives: Patient denies fever Disposition: []$ ED /[]$ Urgent Care (no appt availability in office) / []$ Appointment(In office/virtual)/ []$  [facility]/ []$ Home Care/ []$ Refused Recommended Disposition /[]$ [place] [facility]/ [x]$  Follow-up with PCP Additional Notes: Please  advise pt.  Answer Assessment - Initial Assessment Questions 1. ONSET: "When did the cough begin?"      Monday 2. SEVERITY: "How bad is the cough today?"      Severe 3. SPUTUM: "Describe the color of your sputum" (none, dry cough; clear, white, yellow, green)     None 4. HEMOPTYSIS: "Are you coughing up any blood?" If so ask: "How much?" (flecks, streaks, tablespoons, etc.)     No 5. DIFFICULTY BREATHING: "Are you having difficulty breathing?" If Yes, ask: "How bad is it?" (e.g., mild, moderate, severe)    - MILD: No SOB at rest, mild SOB with walking, speaks normally in sentences, can lie down, no retractions, pulse < 100.    - MODERATE: SOB at rest, SOB with minimal exertion and prefers to sit, cannot lie down flat, speaks in phrases, mild retractions, audible wheezing, pulse 100-120.    - SEVERE: Very SOB at rest, speaks in single words, struggling to breathe, sitting hunched forward, retractions, pulse > 120      None 6. FEVER: "Do you have a fever?" If Yes, ask: "What is your temperature, how was it measured, and when did it start?"     No 7. CARDIAC HISTORY: "Do you have any history of heart disease?" (e.g., heart attack, congestive heart failure)      No 8. LUNG HISTORY: "Do you have any history of lung disease?"  (e.g., pulmonary embolus, asthma, emphysema)     No 9. PE RISK  FACTORS: "Do you have a history of blood clots?" (or: recent major surgery, recent prolonged travel, bedridden)     NoRunnynose 10. OTHER SYMPTOMS: "Do you have any other symptoms?" (e.g., runny nose, wheezing, chest pain)       Runny nose 11. PREGNANCY: "Is there any chance you are pregnant?" "When was your last menstrual period?"       N/a 12. TRAVEL: "Have you traveled out of the country in the last month?" (e.g., travel history, exposures)       no  Protocols used: Cough - Acute Non-Productive-A-AH

## 2023-01-17 NOTE — ED Triage Notes (Signed)
Patient arrived via POV c/o nasal congestion x 2 days with cough. Patient states difficulty breathing through nose with dry cough. Patient is AO x 4, VS w/ elevated BP, normal gait.

## 2023-01-18 ENCOUNTER — Telehealth: Payer: No Typology Code available for payment source | Admitting: Internal Medicine

## 2023-01-18 ENCOUNTER — Encounter (HOSPITAL_BASED_OUTPATIENT_CLINIC_OR_DEPARTMENT_OTHER): Payer: Self-pay | Admitting: Emergency Medicine

## 2023-01-18 LAB — RESP PANEL BY RT-PCR (RSV, FLU A&B, COVID)  RVPGX2
Influenza A by PCR: NEGATIVE
Influenza B by PCR: NEGATIVE
Resp Syncytial Virus by PCR: NEGATIVE
SARS Coronavirus 2 by RT PCR: NEGATIVE

## 2023-01-18 NOTE — ED Provider Notes (Signed)
Steve Carrillo  Provider Note  CSN: [id number] Arrival date & time: 01/17/23 2345  History Chief Complaint  Patient presents with   Nasal Congestion    Steve Carrillo is a 42 y.o. male with history of HTN reports 2 days of dry cough and nasal congestion, no fever. Not improved with OTC Nyquil. He reports compliance with his BP meds but noted to be hypertensive on arrival.    Home Medications Prior to Admission medications   Medication Sig Start Date End Date Taking? Authorizing Provider  atorvastatin (LIPITOR) 40 MG tablet Take 1 tablet (40 mg total) by mouth daily. 08/10/22   Argentina Donovan, PA-C  benzonatate (TESSALON) 100 MG capsule Take 1 capsule (100 mg total) by mouth 3 (three) times daily as needed for cough. 07/11/21   Domenic Moras, PA-C  carvedilol (COREG) 25 MG tablet Take 1.5 tablets (37.5 mg total) by mouth 2 (two) times daily. 12/27/22   Deberah Pelton, NP  dapagliflozin propanediol (FARXIGA) 5 MG TABS tablet Take 1 tablet (5 mg total) by mouth daily before breakfast. 08/10/22   Argentina Donovan, PA-C  furosemide (LASIX) 40 MG tablet TAKE 1 TABLET BY MOUTH DAILY 10/20/22   [name], NP  isosorbide-hydrALAZINE (BIDIL) 20-37.5 MG tablet Take 1 tablet by mouth 3 (three) times daily. 08/10/22   Argentina Donovan, PA-C  methocarbamol (ROBAXIN) 500 MG tablet Take 2 tablets (1,000 mg total) by mouth every 8 (eight) hours as needed for muscle spasms. 08/10/22   Argentina Donovan, PA-C  spironolactone (ALDACTONE) 25 MG tablet Take 1 tablet (25 mg total) by mouth daily. 08/10/22   Argentina Donovan, PA-C  valsartan (DIOVAN) 80 MG tablet Take 1 tablet (80 mg total) by mouth daily. Patient taking differently: Take 160 mg by mouth daily. 10/23/22   Charlott Rakes, MD     Allergies    Patient has no known allergies.   Review of Systems   Review of Systems Please see HPI for pertinent positives and negatives  Physical Exam BP (!)  194/138 (BP Location: Left Arm)   Pulse 96   Temp 98.8 F (37.1 C) (Oral)   Resp 18   Ht 6' 1"$  (1.854 m)   Wt (!) 145.2 kg   BMI 42.22 kg/m   Physical Exam Vitals and nursing note reviewed.  Constitutional:      Appearance: Normal appearance.  HENT:     Head: Normocephalic and atraumatic.     Nose: Congestion present.     Mouth/Throat:     Mouth: Mucous membranes are moist.     Pharynx: No oropharyngeal exudate or posterior oropharyngeal erythema.  Eyes:     Extraocular Movements: Extraocular movements intact.     Conjunctiva/sclera: Conjunctivae normal.  Cardiovascular:     Rate and Rhythm: Normal rate.  Pulmonary:     Effort: Pulmonary effort is normal.     Breath sounds: Normal breath sounds. No wheezing, rhonchi or rales.  Abdominal:     General: Abdomen is flat.     Palpations: Abdomen is soft.     Tenderness: There is no abdominal tenderness.  Musculoskeletal:        General: No swelling. Normal range of motion.     Cervical back: Neck supple.  Skin:    General: Skin is warm and dry.  Neurological:     General: No focal deficit present.     Mental Status: He is alert.  Psychiatric:  Mood and Affect: Mood normal.     ED Results / Procedures / Treatments   EKG None  Procedures Procedures  Medications Ordered in the ED Medications - No data to display  Initial Impression and Plan  Patient here with viral URI symptoms. Elevated BP may be due to OTC Cold remedies, Advised to avoid stimulants, can use Corcidin or Flonase for congestion. Covid/Flu/RSV swab is neg. Advised to continue his home meds, monitor BP and contact PCP if remains elevated when he is feeling better.   ED Course       MDM Rules/Calculators/A&P Medical Decision Making Problems Addressed: Uncontrolled hypertension: chronic illness or injury with exacerbation, progression, or side effects of treatment Viral URI with cough: acute illness or injury  Amount and/or Complexity of  Data Reviewed Labs: ordered. Decision-making details documented in ED Course.  Risk OTC drugs. Prescription drug management.     Final Clinical Impression(s) / ED Diagnoses Final diagnoses:  Viral URI with cough  Uncontrolled hypertension    Rx / DC Orders ED Discharge Orders     None        Truddie Hidden, MD 01/18/23 0131

## 2023-01-29 ENCOUNTER — Other Ambulatory Visit (HOSPITAL_COMMUNITY): Payer: No Typology Code available for payment source

## 2023-02-06 ENCOUNTER — Ambulatory Visit
Payer: No Typology Code available for payment source | Attending: Cardiovascular Disease | Admitting: Cardiovascular Disease

## 2023-02-06 ENCOUNTER — Encounter: Payer: Self-pay | Admitting: Cardiovascular Disease

## 2023-02-06 VITALS — BP 160/97 | HR 87 | Ht 73.0 in | Wt 339.2 lb

## 2023-02-06 DIAGNOSIS — I130 Hypertensive heart and chronic kidney disease with heart failure and stage 1 through stage 4 chronic kidney disease, or unspecified chronic kidney disease: Secondary | ICD-10-CM | POA: Diagnosis not present

## 2023-02-06 DIAGNOSIS — E782 Mixed hyperlipidemia: Secondary | ICD-10-CM

## 2023-02-06 DIAGNOSIS — I5042 Chronic combined systolic (congestive) and diastolic (congestive) heart failure: Secondary | ICD-10-CM

## 2023-02-06 DIAGNOSIS — I5040 Unspecified combined systolic (congestive) and diastolic (congestive) heart failure: Secondary | ICD-10-CM

## 2023-02-06 DIAGNOSIS — N183 Chronic kidney disease, stage 3 unspecified: Secondary | ICD-10-CM

## 2023-02-06 MED ORDER — SACUBITRIL-VALSARTAN 49-51 MG PO TABS: 1.0000 | ORAL_TABLET | Freq: Two times a day (BID) | ORAL | 3 refills | Status: DC

## 2023-02-06 NOTE — Patient Instructions (Signed)
Medication Instructions:  Stop taking Valsartan Entresto 49/51 one tablet two times a day  (If unable to get Entresto due to cost/copay, please notify us) *If you need a refill on your cardiac medications before your next appointment, please call your pharmacy*  Labs: Lipid profile, BMET, Mag, ProBNP- 10-14 days  Follow-Up: At [facility], you and your health needs are our priority.  As part of our continuing mission to provide you with exceptional heart care, we have created designated Provider Care Teams.  These Care Teams include your primary Cardiologist (physician) and Advanced Practice Providers (APPs -  Physician Assistants and Nurse Practitioners) who all work together to provide you with the care you need, when you need it.  We recommend signing up for the patient portal called "MyChart".  Sign up information is provided on this After Visit Summary.  MyChart is used to connect with patients for Virtual Visits (Telemedicine).  Patients are able to view lab/test results, encounter notes, upcoming appointments, etc.  Non-urgent messages can be sent to your provider as well.   To learn more about what you can do with MyChart, go to [email or web address].    Your next appointment:    APP in 2 months  Dr Sallyanne Kuster in 6 months  Other Instructions Referral to [facility]
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
## 2023-02-13 ENCOUNTER — Telehealth: Payer: Self-pay

## 2023-02-13 NOTE — Progress Notes (Signed)
Patient attempted to be outreached by Darrall Dears, PharmD Candidate on 02/12/2023 to discuss hypertension. Left voicemail for patient to return our call at their convenience at [phone].   Darrall Dears, PharmD Candidate   Joseph Art, Pharm.D. PGY-2 Ambulatory Care Pharmacy Resident

## 2023-02-16 ENCOUNTER — Encounter: Payer: Self-pay | Admitting: Nurse Practitioner

## 2023-02-16 ENCOUNTER — Ambulatory Visit: Payer: No Typology Code available for payment source | Attending: Nurse Practitioner | Admitting: Nurse Practitioner

## 2023-02-16 VITALS — BP 151/96 | HR 74 | Ht 73.0 in | Wt 337.6 lb

## 2023-02-16 DIAGNOSIS — R7303 Prediabetes: Secondary | ICD-10-CM | POA: Diagnosis not present

## 2023-02-16 DIAGNOSIS — E7800 Pure hypercholesterolemia, unspecified: Secondary | ICD-10-CM | POA: Diagnosis not present

## 2023-02-16 DIAGNOSIS — I10 Essential (primary) hypertension: Secondary | ICD-10-CM

## 2023-02-16 DIAGNOSIS — D649 Anemia, unspecified: Secondary | ICD-10-CM

## 2023-02-16 MED ORDER — VALSARTAN 320 MG PO TABS: 320.0000 mg | ORAL_TABLET | Freq: Every day | ORAL | 1 refills | Status: DC

## 2023-02-16 NOTE — Progress Notes (Signed)
Assessment & Plan:  Steve Carrillo was seen today for hypertension.  Diagnoses and all orders for this visit:  Primary hypertension -     CMP14+EGFR Follow up with Cardiology for [facility] (still has not picked up) -     valsartan (DIOVAN) 320 MG tablet; Take 1 tablet (320 mg total) by mouth daily. Take until you receive entresto  Prediabetes -     Hemoglobin A1c  Hypercholesterolemia -     Lipid panel INSTRUCTIONS: Work on a low fat, heart healthy diet and participate in regular aerobic exercise program by working out at least 150 minutes per week; 5 days a week-30 minutes per day. Avoid red meat/beef/steak,  fried foods. junk foods, sodas, sugary drinks, unhealthy snacking, alcohol and smoking.  Drink at least 80 oz of water per day and monitor your carbohydrate intake daily   Anemia, unspecified type -     CBC with Differential    Patient has been counseled on age-appropriate routine health concerns for screening and prevention. These are reviewed and up-to-date. Referrals have been placed accordingly. Immunizations are up-to-date or declined.    Subjective:   Chief Complaint  Patient presents with   Hypertension   HPI Steve Carrillo 42 y.o. male presents to office today for follow up to HTN.  He has a history of essential hypertension, CAD, chronic systolic and diastolic heart failure with BMI 35 to [id number], grade 2 diastolic dysfunction, nonischemic dilated cardiomyopathy, moderate OSA, morbid obesity with BMI of 41, CKD stage 2, dyslipidemia and prediabetes.  Renal artery ultrasound negative for renal artery stenosis in 2015.  He has a history of nonadherence with taking his medications as well as dietary and exercise compliance.    Unfortunately he still has not received his Entresto. There seems to be some confusion regarding the reason he has not picked this up. He states there is paperwork that needs to be completed however based on review of his chart it appears he was given a  copay card to use for pick up and this was declined at his pharmacy    HTN  Blood pressure is not well controlled. He has been taking valsartan 160 mg daily (in place on entresto) carvedilol 37.5 mg BID, bidil 20-37.5 mg BID (instead of TID) and spirinolactone 25 mg daily.  BP Readings from Last 3 Encounters:  02/16/23 (!) 151/96  02/06/23 (!) 160/97  01/18/23 (!) 187/131     Prediabetes Well controlled he is taking farxiga 5 mg daily.  Lab Results  Component Value Date   HGBA1C 6.0 (H) 08/10/2022     Review of Systems  Constitutional:  Negative for fever, malaise/fatigue and weight loss.  HENT: Negative.  Negative for nosebleeds.   Eyes: Negative.  Negative for blurred vision, double vision and photophobia.  Respiratory: Negative.  Negative for cough and shortness of breath.   Cardiovascular: Negative.  Negative for chest pain, palpitations and leg swelling.  Gastrointestinal: Negative.  Negative for heartburn, nausea and vomiting.  Musculoskeletal: Negative.  Negative for myalgias.  Neurological: Negative.  Negative for dizziness, focal weakness, seizures and headaches.  Psychiatric/Behavioral: Negative.  Negative for suicidal ideas.     Past Medical History:  Diagnosis Date   Cardiomyopathy due to hypertension ([place]) 10/2014   NICM EF 35-40%, global LV strain is abnormal at -10.9%   Dilated aortic root ([place])    seen on previous echo 02/2016   Hyperlipidemia LDL goal <130 2015   Hypertension    Prediabetes  Past Surgical History:  Procedure Laterality Date   HERNIA REPAIR     LEFT HEART CATHETERIZATION WITH CORONARY ANGIOGRAM N/A 10/22/2014   Procedure: LEFT HEART CATHETERIZATION WITH CORONARY ANGIOGRAM;  Surgeon: Peter M Martinique, MD; Normal coronary anatomy. Vessels are very large due to hypertensive cardiomyopathy.    RIGHT/LEFT HEART CATH AND CORONARY ANGIOGRAPHY N/A 08/10/2020   Procedure: RIGHT/LEFT HEART CATH AND CORONARY ANGIOGRAPHY;  Surgeon: Martinique, Peter M,  MD;  Location: [facility];  Service: Cardiovascular;  Laterality: N/A;    Family History  Problem Relation Age of Onset   Diabetes Mellitus II Mother    Hypertension Mother    Hypertension Brother    Diabetes Mellitus II Maternal Grandmother     Social History Reviewed with no changes to be made today.   Outpatient Medications Prior to Visit  Medication Sig Dispense Refill   atorvastatin (LIPITOR) 40 MG tablet Take 1 tablet (40 mg total) by mouth daily. 90 tablet 1   carvedilol (COREG) 25 MG tablet Take 1.5 tablets (37.5 mg total) by mouth 2 (two) times daily. 180 tablet 1   dapagliflozin propanediol (FARXIGA) 5 MG TABS tablet Take 1 tablet (5 mg total) by mouth daily before breakfast. 90 tablet 1   furosemide (LASIX) 40 MG tablet TAKE 1 TABLET BY MOUTH DAILY 90 tablet 0   isosorbide-hydrALAZINE (BIDIL) 20-37.5 MG tablet Take 1 tablet by mouth 3 (three) times daily. 270 tablet 1   methocarbamol (ROBAXIN) 500 MG tablet Take 2 tablets (1,000 mg total) by mouth every 8 (eight) hours as needed for muscle spasms. 90 tablet 0   spironolactone (ALDACTONE) 25 MG tablet Take 1 tablet (25 mg total) by mouth daily. 90 tablet 1   sacubitril-valsartan (ENTRESTO) 49-51 MG Take 1 tablet by mouth 2 (two) times daily. 180 tablet 3   benzonatate (TESSALON) 100 MG capsule Take 1 capsule (100 mg total) by mouth 3 (three) times daily as needed for cough. (Patient not taking: Reported on 02/16/2023) 21 capsule 0   No facility-administered medications prior to visit.    No Known Allergies     Objective:    BP (!) 151/96   Pulse 74   Ht 6\' 1"  (1.854 m)   Wt (!) 337 lb 9.6 oz (153.1 kg)   SpO2 96%   BMI 44.54 kg/m  Wt Readings from Last 3 Encounters:  02/16/23 (!) 337 lb 9.6 oz (153.1 kg)  02/06/23 (!) 339 lb 3.2 oz (153.9 kg)  01/18/23 (!) 320 lb (145.2 kg)    Physical Exam Vitals and nursing note reviewed.  Constitutional:      Appearance: He is well-developed.  HENT:     Head:  Normocephalic and atraumatic.  Cardiovascular:     Rate and Rhythm: Normal rate and regular rhythm.     Heart sounds: Normal heart sounds. No murmur heard.    No friction rub. No gallop.  Pulmonary:     Effort: Pulmonary effort is normal. No tachypnea or respiratory distress.     Breath sounds: Normal breath sounds. No decreased breath sounds, wheezing, rhonchi or rales.  Chest:     Chest wall: No tenderness.  Abdominal:     General: Bowel sounds are normal.     Palpations: Abdomen is soft.  Musculoskeletal:        General: Normal range of motion.     Cervical back: Normal range of motion.  Skin:    General: Skin is warm and dry.  Neurological:  Mental Status: He is alert and oriented to person, place, and time.     Coordination: Coordination normal.  Psychiatric:        Behavior: Behavior normal. Behavior is cooperative.        Thought Content: Thought content normal.        Judgment: Judgment normal.          Patient has been counseled extensively about nutrition and exercise as well as the importance of adherence with medications and regular follow-up. The patient was given clear instructions to go to ER or return to medical center if symptoms don't improve, worsen or new problems develop. The patient verbalized understanding.   Follow-up: Return in 3 months (on 05/19/2023).   [name], FNP-BC [facility], [place]   02/16/2023, 8:54 PM

## 2023-02-17 LAB — LIPID PANEL
Chol/HDL Ratio: 4.2 ratio (ref 0.0–5.0)
Cholesterol, Total: 185 mg/dL (ref 100–199)
HDL: 44 mg/dL (ref >39)
LDL Chol Calc (NIH): 104 mg/dL — ABNORMAL HIGH (ref 0–99)
Triglycerides: 212 mg/dL — ABNORMAL HIGH (ref 0–149)
VLDL Cholesterol Cal: 37 mg/dL (ref 5–40)

## 2023-02-17 LAB — HEMOGLOBIN A1C
Est. average glucose Bld gHb Est-mCnc: 128 mg/dL
Hgb A1c MFr Bld: 6.1 % — ABNORMAL HIGH (ref 4.8–5.6)

## 2023-02-17 LAB — CBC WITH DIFFERENTIAL/PLATELET
Basophils Absolute: 0.1 10*3/uL (ref 0.0–0.2)
Basos: 2 %
EOS (ABSOLUTE): 0.2 10*3/uL (ref 0.0–0.4)
Eos: 3 %
Hematocrit: 45.5 % (ref 37.5–51.0)
Hemoglobin: 15.4 g/dL (ref 13.0–17.7)
Immature Grans (Abs): 0 10*3/uL (ref 0.0–0.1)
Immature Granulocytes: 0 %
Lymphocytes Absolute: 1.5 10*3/uL (ref 0.7–3.1)
Lymphs: 33 %
MCH: 28.8 pg (ref 26.6–33.0)
MCHC: 33.8 g/dL (ref 31.5–35.7)
MCV: 85 fL (ref 79–97)
Monocytes Absolute: 0.5 10*3/uL (ref 0.1–0.9)
Monocytes: 11 %
Neutrophils Absolute: 2.4 10*3/uL (ref 1.4–7.0)
Neutrophils: 51 %
Platelets: 262 10*3/uL (ref 150–450)
RBC: 5.34 x10E6/uL (ref 4.14–5.80)
RDW: 14.6 % (ref 11.6–15.4)
WBC: 4.7 10*3/uL (ref 3.4–10.8)

## 2023-02-17 LAB — CMP14+EGFR
ALT: 21 IU/L (ref 0–44)
AST: 19 IU/L (ref 0–40)
Albumin/Globulin Ratio: 1.5 (ref 1.2–2.2)
Albumin: 4.3 g/dL (ref 4.1–5.1)
Alkaline Phosphatase: 98 IU/L (ref 44–121)
BUN/Creatinine Ratio: 12 (ref 9–20)
BUN: 18 mg/dL (ref 6–24)
Bilirubin Total: 0.3 mg/dL (ref 0.0–1.2)
CO2: 17 mmol/L — ABNORMAL LOW (ref 20–29)
Calcium: 9.5 mg/dL (ref 8.7–10.2)
Chloride: 104 mmol/L (ref 96–106)
Creatinine, Ser: 1.51 mg/dL — ABNORMAL HIGH (ref 0.76–1.27)
Globulin, Total: 2.8 g/dL (ref 1.5–4.5)
Glucose: 96 mg/dL (ref 70–99)
Potassium: 4.5 mmol/L (ref 3.5–5.2)
Sodium: 141 mmol/L (ref 134–144)
Total Protein: 7.1 g/dL (ref 6.0–8.5)
eGFR: 59 mL/min/{1.73_m2} — ABNORMAL LOW (ref >59)

## 2023-02-19 ENCOUNTER — Other Ambulatory Visit: Payer: Self-pay | Admitting: Nurse Practitioner

## 2023-02-19 DIAGNOSIS — E785 Hyperlipidemia, unspecified: Secondary | ICD-10-CM

## 2023-02-19 MED ORDER — ATORVASTATIN CALCIUM 40 MG PO TABS: 40.0000 mg | ORAL_TABLET | Freq: Every day | ORAL | 1 refills | Status: DC

## 2023-02-27 ENCOUNTER — Ambulatory Visit (HOSPITAL_COMMUNITY): Payer: No Typology Code available for payment source | Attending: General Practice

## 2023-02-27 DIAGNOSIS — I5043 Acute on chronic combined systolic (congestive) and diastolic (congestive) heart failure: Secondary | ICD-10-CM | POA: Diagnosis not present

## 2023-02-28 LAB — ECHOCARDIOGRAM COMPLETE
Area-P 1/2: 2.69 cm2
P 1/2 time: 384 msec
S' Lateral: 5.3 cm

## 2023-03-06 ENCOUNTER — Other Ambulatory Visit: Payer: Self-pay | Admitting: Physician Assistant

## 2023-03-06 ENCOUNTER — Other Ambulatory Visit: Payer: Self-pay | Admitting: Nurse Practitioner

## 2023-03-06 DIAGNOSIS — E876 Hypokalemia: Secondary | ICD-10-CM

## 2023-03-06 DIAGNOSIS — I5022 Chronic systolic (congestive) heart failure: Secondary | ICD-10-CM

## 2023-03-06 DIAGNOSIS — I10 Essential (primary) hypertension: Secondary | ICD-10-CM

## 2023-03-06 NOTE — Telephone Encounter (Signed)
Requested Prescriptions  Pending Prescriptions Disp Refills   isosorbide-hydrALAZINE (BIDIL) 20-37.5 MG tablet [Pharmacy Med Name: ISOSOR/HYDRAL TAB 20-37.5MG ] 270 tablet 1    Sig: TAKE 1 TABLET BY MOUTH 3 TIMES  DAILY     Cardiovascular:  Vasodilators Failed - 03/06/2023  7:56 AM      Failed - ANA Screen, Ifa, Serum in normal range and within 360 days    No results found for: "ANA", "ANATITER", "LABANTI"       Failed - Last BP in normal range    BP Readings from Last 1 Encounters:  02/16/23 (!) 151/96         Passed - HCT in normal range and within 360 days    Hematocrit  Date Value Ref Range Status  02/16/2023 45.5 37.5 - 51.0 % Final         Passed - HGB in normal range and within 360 days    Hemoglobin  Date Value Ref Range Status  02/16/2023 15.4 13.0 - 17.7 g/dL Final         Passed - RBC in normal range and within 360 days    RBC  Date Value Ref Range Status  02/16/2023 5.34 4.14 - 5.80 x10E6/uL Final  07/11/2021 4.62 4.22 - 5.81 MIL/uL Final         Passed - WBC in normal range and within 360 days    WBC  Date Value Ref Range Status  02/16/2023 4.7 3.4 - 10.8 x10E3/uL Final  07/11/2021 9.4 4.0 - 10.5 K/uL Final         Passed - PLT in normal range and within 360 days    Platelets  Date Value Ref Range Status  02/16/2023 262 150 - 450 x10E3/uL Final         Passed - Valid encounter within last 12 months    Recent Outpatient Visits           2 weeks ago Primary hypertension   [place], Vernia Buff, NP   3 months ago Primary hypertension   [place], [place] W, NP   4 months ago Chronic systolic congestive heart failure [facility])   [place], Jarome Matin, RPH-CPP   6 months ago Essential hypertension, malignant   [place], [place], [place]   1 year ago Hyperglycemia   [place], [place]       Future Appointments             In 1 month Monge, Helane Gunther, NP [place] at [facility]   In 2 months [name], NP [place]   In 5 months Croitoru, [place], MD [place] at [facility]             spironolactone (ALDACTONE) 25 MG tablet [Pharmacy Med Name: Spironolactone 25 MG Oral Tablet] 90 tablet 1    Sig: TAKE 1 TABLET BY MOUTH DAILY     Cardiovascular: Diuretics - Aldosterone Antagonist Failed - 03/06/2023  7:56 AM      Failed - Cr in normal range and within 180 days    Creat  Date Value Ref Range Status  10/19/2016 1.32 0.60 - 1.35 mg/dL Final   Creatinine, Ser  Date Value Ref Range Status  02/16/2023 1.51 (H) 0.76 -  1.27 mg/dL Final   Creatinine, Urine  Date Value Ref Range Status  10/21/2014 240.9 mg/dL Final    Comment:    No reference range established. Performed at [facility]          Failed - Last BP in normal range    BP Readings from Last 1 Encounters:  02/16/23 (!) 151/96         Passed - K in normal range and within 180 days    Potassium  Date Value Ref Range Status  02/16/2023 4.5 3.5 - 5.2 mmol/L Final         Passed - Na in normal range and within 180 days    Sodium  Date Value Ref Range Status  02/16/2023 141 134 - 144 mmol/L Final         Passed - eGFR is 30 or above and within 180 days    GFR calc Af Amer  Date Value Ref Range Status  08/12/2020 53 (L) >60 mL/min Final   GFR, Estimated  Date Value Ref Range Status  07/11/2021 52 (L) >60 mL/min Final    Comment:    (NOTE) Calculated using the CKD-EPI Creatinine Equation (2021)    GFR  Date Value Ref Range Status  08/21/2019 65.48 >60.00 mL/min Final   eGFR  Date Value Ref Range Status  02/16/2023 59 (L) >59 mL/min/1.73 Final         Passed - Valid encounter within last 6 months    Recent Outpatient Visits           2  weeks ago Primary hypertension   [place], Vernia Buff, NP   3 months ago Primary hypertension   [place], [place] W, NP   4 months ago Chronic systolic congestive heart failure [facility])   [place], Jarome Matin, RPH-CPP   6 months ago Essential hypertension, malignant   [place], [place], [place]   1 year ago Hyperglycemia   [place], [place]       Future Appointments             In 1 month Monge, Helane Gunther, NP [place] at [facility]   In 2 months [name], NP [place]   In 5 months Croitoru, Dani Gobble, MD [place] at [facility] 5 MG TABS tablet [Pharmacy Med Name: Farxiga 5 MG Oral Tablet] 90 tablet 1    Sig: TAKE 1 TABLET BY [place]     Endocrinology:  Diabetes - SGLT2 Inhibitors Failed - 03/06/2023  7:56 AM      Failed - Cr in normal range and within 360 days    Creat  Date Value Ref Range Status  10/19/2016 1.32 0.60 - 1.35 mg/dL Final   Creatinine, Ser  Date Value Ref Range Status  02/16/2023 1.51 (H) 0.76 - 1.27 mg/dL Final   Creatinine, Urine  Date Value Ref Range Status  10/21/2014 240.9 mg/dL Final    Comment:    No reference range established. Performed at [facility]          Failed - eGFR in normal range and within 360 days    GFR  calc Af Amer  Date Value Ref Range Status  08/12/2020 53 (L) >60 mL/min Final   GFR, Estimated  Date Value Ref Range Status  07/11/2021 52 (L) >60 mL/min Final    Comment:    (NOTE) Calculated using the CKD-EPI Creatinine Equation (2021)    GFR  Date Value Ref Range Status  08/21/2019 65.48 >60.00 mL/min Final   eGFR  Date Value Ref Range Status  02/16/2023 59 (L)  >59 mL/min/1.73 Final         Passed - HBA1C is between 0 and 7.9 and within 180 days    Hgb A1c MFr Bld  Date Value Ref Range Status  02/16/2023 6.1 (H) 4.8 - 5.6 % Final    Comment:             Prediabetes: 5.7 - 6.4          Diabetes: >6.4          Glycemic control for adults with diabetes: <7.0          Passed - Valid encounter within last 6 months    Recent Outpatient Visits           2 weeks ago Primary hypertension   [place], Vernia Buff, NP   3 months ago Primary hypertension   [place], [place] W, NP   4 months ago Chronic systolic congestive heart failure [facility])   [place], Jarome Matin, RPH-CPP   6 months ago Essential hypertension, malignant   [place], [place], [place]   1 year ago Hyperglycemia   [place], [place]       Future Appointments             In 1 month Monge, Helane Gunther, NP [place] at [facility]   In 2 months [name], NP [place]   In 5 months Croitoru, [place], MD [place] at [facility]             carvedilol (COREG) 25 MG tablet [Pharmacy Med Name: Carvedilol 25 MG Oral Tablet] 180 tablet 3    Sig: TAKE 1 TABLET BY MOUTH TWICE  DAILY     Cardiovascular: Beta Blockers 3 Failed - 03/06/2023  7:56 AM      Failed - Cr in normal range and within 360 days    Creat  Date Value Ref Range Status  10/19/2016 1.32 0.60 - 1.35 mg/dL Final   Creatinine, Ser  Date Value Ref Range Status  02/16/2023 1.51 (H) 0.76 - 1.27 mg/dL Final   Creatinine, Urine  Date Value Ref Range Status  10/21/2014 240.9 mg/dL Final    Comment:    No reference range established. Performed at [facility]          Failed - Last BP in normal range    BP  Readings from Last 1 Encounters:  02/16/23 (!) 151/96         Passed - AST in normal range and within 360 days    AST  Date Value Ref Range Status  02/16/2023 19 0 - 40 IU/L Final         Passed - ALT in normal range and within 360 days    ALT  Date Value Ref Range Status  02/16/2023 21 0 - 44 IU/L Final         Passed - Last Heart Rate in normal range    Pulse Readings from Last 1 Encounters:  02/16/23 74         Passed - Valid encounter within last 6 months    Recent Outpatient Visits           2 weeks ago Primary hypertension   [place], Vernia Buff, NP   3 months ago Primary hypertension   [place], NP   4 months ago Chronic systolic congestive heart failure [facility])   [place], Jarome Matin, RPH-CPP   6 months ago Essential hypertension, malignant   [place], [place], [place]   1 year ago Hyperglycemia   [place], [place]       Future Appointments             In 1 month Monge, Helane Gunther, NP [place] at [facility]   In 2 months [name], NP [place]   In 5 months Croitoru, Dani Gobble, MD [place] at [facility]

## 2023-03-06 NOTE — Progress Notes (Signed)
Patient attempted to be outreached by Toma Aran, PharmD Candidate on 03/06/2023 to discuss hypertension. Left voicemail for patient to return our call at their convenience at [phone].   Toma Aran, PharmD Candidate   Catie Hedwig Morton, PharmD, [place], [employer] [phone]

## 2023-03-06 NOTE — Telephone Encounter (Signed)
Requested medication (s) are due for refill today -no  Requested medication (s) are on the active medication list -yes  Future visit scheduled -yes  Last refill: 12/27/22 #180 1RF  Notes to clinic: outside provider  Requested Prescriptions  Pending Prescriptions Disp Refills   carvedilol (COREG) 25 MG tablet [Pharmacy Med Name: Carvedilol 25 MG Oral Tablet] 180 tablet 3    Sig: TAKE 1 TABLET BY MOUTH TWICE  DAILY     Cardiovascular: Beta Blockers 3 Failed - 03/06/2023  7:56 AM      Failed - Cr in normal range and within 360 days    Creat  Date Value Ref Range Status  10/19/2016 1.32 0.60 - 1.35 mg/dL Final   Creatinine, Ser  Date Value Ref Range Status  02/16/2023 1.51 (H) 0.76 - 1.27 mg/dL Final   Creatinine, Urine  Date Value Ref Range Status  10/21/2014 240.9 mg/dL Final    Comment:    No reference range established. Performed at [facility]          Failed - Last BP in normal range    BP Readings from Last 1 Encounters:  02/16/23 (!) 151/96         Passed - AST in normal range and within 360 days    AST  Date Value Ref Range Status  02/16/2023 19 0 - 40 IU/L Final         Passed - ALT in normal range and within 360 days    ALT  Date Value Ref Range Status  02/16/2023 21 0 - 44 IU/L Final         Passed - Last Heart Rate in normal range    Pulse Readings from Last 1 Encounters:  02/16/23 74         Passed - Valid encounter within last 6 months    Recent Outpatient Visits           2 weeks ago Primary hypertension   [place], [place], NP   3 months ago Primary hypertension   [place], NP   4 months ago Chronic systolic congestive heart failure [facility])   [place], Jarome Matin, RPH-CPP   6 months ago Essential hypertension, malignant   [place], [place], [place]   1 year ago Hyperglycemia   [place], [place]       Future Appointments             In 1 month Monge, Helane Gunther, NP [place] at [facility]   In 2 months [name], NP [place]   In 5 months Croitoru, [place], MD [place] at [facility]            Signed Prescriptions Disp Refills   isosorbide-hydrALAZINE (BIDIL) 20-37.5 MG tablet 270 tablet 1    Sig: TAKE 1 TABLET BY MOUTH 3 TIMES  DAILY     Cardiovascular:  Vasodilators Failed - 03/06/2023  7:56 AM      Failed - ANA Screen, Ifa, Serum in normal range and within 360 days    No results found for: "ANA", "ANATITER", "LABANTI"       Failed - Last BP in normal range    BP Readings from Last 1  Encounters:  02/16/23 (!) 151/96         Passed - HCT in normal range and within 360 days    Hematocrit  Date Value Ref Range Status  02/16/2023 45.5 37.5 - 51.0 % Final         Passed - HGB in normal range and within 360 days    Hemoglobin  Date Value Ref Range Status  02/16/2023 15.4 13.0 - 17.7 g/dL Final         Passed - RBC in normal range and within 360 days    RBC  Date Value Ref Range Status  02/16/2023 5.34 4.14 - 5.80 x10E6/uL Final  07/11/2021 4.62 4.22 - 5.81 MIL/uL Final         Passed - WBC in normal range and within 360 days    WBC  Date Value Ref Range Status  02/16/2023 4.7 3.4 - 10.8 x10E3/uL Final  07/11/2021 9.4 4.0 - 10.5 K/uL Final         Passed - PLT in normal range and within 360 days    Platelets  Date Value Ref Range Status  02/16/2023 262 150 - 450 x10E3/uL Final         Passed - Valid encounter within last 12 months    Recent Outpatient Visits           2 weeks ago Primary hypertension   [place], Vernia Buff, NP   3 months ago Primary hypertension   [place],  [place] W, NP   4 months ago Chronic systolic congestive heart failure [facility])   [place], Jarome Matin, RPH-CPP   6 months ago Essential hypertension, malignant   [place], [place], [place]   1 year ago Hyperglycemia   [place], [place]       Future Appointments             In 1 month Monge, Helane Gunther, NP [place] at [facility]   In 2 months [name], NP [place]   In 5 months Croitoru, [place], MD [place] at [facility]             spironolactone (ALDACTONE) 25 MG tablet 90 tablet 1    Sig: TAKE 1 TABLET BY MOUTH DAILY     Cardiovascular: Diuretics - Aldosterone Antagonist Failed - 03/06/2023  7:56 AM      Failed - Cr in normal range and within 180 days    Creat  Date Value Ref Range Status  10/19/2016 1.32 0.60 - 1.35 mg/dL Final   Creatinine, Ser  Date Value Ref Range Status  02/16/2023 1.51 (H) 0.76 - 1.27 mg/dL Final   Creatinine, Urine  Date Value Ref Range Status  10/21/2014 240.9 mg/dL Final    Comment:    No reference range established. Performed at [facility]          Failed - Last BP in normal range    BP Readings from Last 1 Encounters:  02/16/23 (!) 151/96         Passed - K in normal range and within 180 days    Potassium  Date Value Ref Range Status  02/16/2023 4.5 3.5 - 5.2 mmol/L Final  Passed - Na in normal range and within 180 days    Sodium  Date Value Ref Range Status  02/16/2023 141 134 - 144 mmol/L Final         Passed - eGFR is 30 or above and within 180 days    GFR calc Af Amer  Date Value Ref Range Status  08/12/2020 53 (L) >60 mL/min Final   GFR, Estimated  Date Value Ref Range Status  07/11/2021 52 (L) >60 mL/min Final    Comment:    (NOTE) Calculated using the CKD-EPI Creatinine  Equation (2021)    GFR  Date Value Ref Range Status  08/21/2019 65.48 >60.00 mL/min Final   eGFR  Date Value Ref Range Status  02/16/2023 59 (L) >59 mL/min/1.73 Final         Passed - Valid encounter within last 6 months    Recent Outpatient Visits           2 weeks ago Primary hypertension   [place], Vernia Buff, NP   3 months ago Primary hypertension   [place], [place] W, NP   4 months ago Chronic systolic congestive heart failure [facility])   [place], Jarome Matin, RPH-CPP   6 months ago Essential hypertension, malignant   [place], [place], [place]   1 year ago Hyperglycemia   [place], [place]       Future Appointments             In 1 month Monge, Helane Gunther, NP [place] at [facility]   In 2 months [name], NP [place]   In 5 months Croitoru, [place], MD [place] at [facility]             dapagliflozin propanediol (FARXIGA) 5 MG TABS tablet 90 tablet 1    Sig: TAKE 1 TABLET BY MOUTH DAILY  BEFORE BREAKFAST     Endocrinology:  Diabetes - SGLT2 Inhibitors Failed - 03/06/2023  7:56 AM      Failed - Cr in normal range and within 360 days    Creat  Date Value Ref Range Status  10/19/2016 1.32 0.60 - 1.35 mg/dL Final   Creatinine, Ser  Date Value Ref Range Status  02/16/2023 1.51 (H) 0.76 - 1.27 mg/dL Final   Creatinine, Urine  Date Value Ref Range Status  10/21/2014 240.9 mg/dL Final    Comment:    No reference range established. Performed at [facility]          Failed - eGFR in normal range and within 360 days    GFR calc Af Amer  Date Value Ref Range Status  08/12/2020 53 (L) >60 mL/min Final   GFR, Estimated  Date Value Ref Range  Status  07/11/2021 52 (L) >60 mL/min Final    Comment:    (NOTE) Calculated using the CKD-EPI Creatinine Equation (2021)    GFR  Date Value Ref Range Status  08/21/2019 65.48 >60.00 mL/min Final   eGFR  Date Value Ref Range Status  02/16/2023 59 (L) >59 mL/min/1.73 Final         Passed - HBA1C is between 0 and 7.9 and within 180 days    Hgb A1c MFr Bld  Date Value Ref Range  Status  02/16/2023 6.1 (H) 4.8 - 5.6 % Final    Comment:             Prediabetes: 5.7 - 6.4          Diabetes: >6.4          Glycemic control for adults with diabetes: <7.0          Passed - Valid encounter within last 6 months    Recent Outpatient Visits           2 weeks ago Primary hypertension   [place], Vernia Buff, NP   3 months ago Primary hypertension   [place], NP   4 months ago Chronic systolic congestive heart failure [facility])   [place], Jarome Matin, RPH-CPP   6 months ago Essential hypertension, malignant   [place], [place], [place]   1 year ago Hyperglycemia   [place], [place]       Future Appointments             In 1 month Monge, Helane Gunther, NP [place] at [facility]   In 2 months [name], NP [place]   In 5 months Croitoru, [place], MD [place] at [facility]               Requested Prescriptions  Pending Prescriptions Disp Refills   carvedilol (COREG) 25 MG tablet [Pharmacy Med Name: Carvedilol 25 MG Oral Tablet] 180 tablet 3    Sig: TAKE 1 TABLET BY MOUTH TWICE  DAILY     Cardiovascular: Beta Blockers 3 Failed - 03/06/2023  7:56 AM      Failed - Cr in normal range and within 360 days    Creat  Date Value Ref Range Status  10/19/2016 1.32 0.60 -  1.35 mg/dL Final   Creatinine, Ser  Date Value Ref Range Status  02/16/2023 1.51 (H) 0.76 - 1.27 mg/dL Final   Creatinine, Urine  Date Value Ref Range Status  10/21/2014 240.9 mg/dL Final    Comment:    No reference range established. Performed at [facility]          Failed - Last BP in normal range    BP Readings from Last 1 Encounters:  02/16/23 (!) 151/96         Passed - AST in normal range and within 360 days    AST  Date Value Ref Range Status  02/16/2023 19 0 - 40 IU/L Final         Passed - ALT in normal range and within 360 days    ALT  Date Value Ref Range Status  02/16/2023 21 0 - 44 IU/L Final         Passed - Last Heart Rate in normal range    Pulse Readings from Last 1 Encounters:  02/16/23 74         Passed - Valid encounter within last 6 months    Recent Outpatient Visits           2 weeks ago Primary hypertension   [place], NP   3 months ago Primary hypertension   [place]  [name], NP   4 months ago Chronic systolic congestive heart failure [facility])   [place], Jarome Matin, RPH-CPP   6 months ago Essential hypertension, malignant   [place], [place], [place]   1 year ago Hyperglycemia   [place], [place]       Future Appointments             In 1 month Monge, Helane Gunther, NP [place] at [facility]   In 2 months [name], NP [place]   In 5 months Croitoru, [place], MD [place] at [facility]            Signed Prescriptions Disp Refills   isosorbide-hydrALAZINE (BIDIL) 20-37.5 MG tablet 270 tablet 1    Sig: TAKE 1 TABLET BY MOUTH 3 TIMES  DAILY     Cardiovascular:  Vasodilators Failed - 03/06/2023  7:56 AM       Failed - ANA Screen, Ifa, Serum in normal range and within 360 days    No results found for: "ANA", "ANATITER", "LABANTI"       Failed - Last BP in normal range    BP Readings from Last 1 Encounters:  02/16/23 (!) 151/96         Passed - HCT in normal range and within 360 days    Hematocrit  Date Value Ref Range Status  02/16/2023 45.5 37.5 - 51.0 % Final         Passed - HGB in normal range and within 360 days    Hemoglobin  Date Value Ref Range Status  02/16/2023 15.4 13.0 - 17.7 g/dL Final         Passed - RBC in normal range and within 360 days    RBC  Date Value Ref Range Status  02/16/2023 5.34 4.14 - 5.80 x10E6/uL Final  07/11/2021 4.62 4.22 - 5.81 MIL/uL Final         Passed - WBC in normal range and within 360 days    WBC  Date Value Ref Range Status  02/16/2023 4.7 3.4 - 10.8 x10E3/uL Final  07/11/2021 9.4 4.0 - 10.5 K/uL Final         Passed - PLT in normal range and within 360 days    Platelets  Date Value Ref Range Status  02/16/2023 262 150 - 450 x10E3/uL Final         Passed - Valid encounter within last 12 months    Recent Outpatient Visits           2 weeks ago Primary hypertension   [place], Vernia Buff, NP   3 months ago Primary hypertension   [place], [place], NP   4 months ago Chronic systolic congestive heart failure [facility])   [place], Jarome Matin, RPH-CPP   6 months ago Essential hypertension, malignant   [place], [place], [place]   1 year ago Hyperglycemia   [place], [place]       Future Appointments             In 1 month Monge, Helane Gunther, NP [place] at  [facility]   In 2 months Raul Del, Zelda W, NP [place]   In 5 months Croitoru, [place],  MD [place] at [facility]             spironolactone (ALDACTONE) 25 MG tablet 90 tablet 1    Sig: TAKE 1 TABLET BY MOUTH DAILY     Cardiovascular: Diuretics - Aldosterone Antagonist Failed - 03/06/2023  7:56 AM      Failed - Cr in normal range and within 180 days    Creat  Date Value Ref Range Status  10/19/2016 1.32 0.60 - 1.35 mg/dL Final   Creatinine, Ser  Date Value Ref Range Status  02/16/2023 1.51 (H) 0.76 - 1.27 mg/dL Final   Creatinine, Urine  Date Value Ref Range Status  10/21/2014 240.9 mg/dL Final    Comment:    No reference range established. Performed at [facility]          Failed - Last BP in normal range    BP Readings from Last 1 Encounters:  02/16/23 (!) 151/96         Passed - K in normal range and within 180 days    Potassium  Date Value Ref Range Status  02/16/2023 4.5 3.5 - 5.2 mmol/L Final         Passed - Na in normal range and within 180 days    Sodium  Date Value Ref Range Status  02/16/2023 141 134 - 144 mmol/L Final         Passed - eGFR is 30 or above and within 180 days    GFR calc Af Amer  Date Value Ref Range Status  08/12/2020 53 (L) >60 mL/min Final   GFR, Estimated  Date Value Ref Range Status  07/11/2021 52 (L) >60 mL/min Final    Comment:    (NOTE) Calculated using the CKD-EPI Creatinine Equation (2021)    GFR  Date Value Ref Range Status  08/21/2019 65.48 >60.00 mL/min Final   eGFR  Date Value Ref Range Status  02/16/2023 59 (L) >59 mL/min/1.73 Final         Passed - Valid encounter within last 6 months    Recent Outpatient Visits           2 weeks ago Primary hypertension   [place], Vernia Buff, NP   3 months ago Primary hypertension   [place], [place] W, NP   4 months ago Chronic systolic congestive heart failure [facility])   [place], Jarome Matin, RPH-CPP   6 months ago Essential hypertension, malignant   [place], [place], [place]   1 year ago Hyperglycemia   [place], [place]       Future Appointments             In 1 month Monge, Helane Gunther, NP [place] at [facility]   In 2 months [name], NP [place]   In 5 months Croitoru, [place], MD [facility]             dapagliflozin propanediol (FARXIGA) 5 MG TABS tablet 90 tablet 1    Sig: TAKE 1 TABLET BY MOUTH DAILY  BEFORE BREAKFAST  Endocrinology:  Diabetes - SGLT2 Inhibitors Failed - 03/06/2023  7:56 AM      Failed - Cr in normal range and within 360 days    Creat  Date Value Ref Range Status  10/19/2016 1.32 0.60 - 1.35 mg/dL Final   Creatinine, Ser  Date Value Ref Range Status  02/16/2023 1.51 (H) 0.76 - 1.27 mg/dL Final   Creatinine, Urine  Date Value Ref Range Status  10/21/2014 240.9 mg/dL Final    Comment:    No reference range established. Performed at [facility]          Failed - eGFR in normal range and within 360 days    GFR calc Af Amer  Date Value Ref Range Status  08/12/2020 53 (L) >60 mL/min Final   GFR, Estimated  Date Value Ref Range Status  07/11/2021 52 (L) >60 mL/min Final    Comment:    (NOTE) Calculated using the CKD-EPI Creatinine Equation (2021)    GFR  Date Value Ref Range Status  08/21/2019 65.48 >60.00 mL/min Final   eGFR  Date Value Ref Range Status  02/16/2023 59 (L) >59 mL/min/1.73 Final         Passed - HBA1C is between 0 and 7.9 and within 180 days    Hgb A1c MFr Bld  Date Value Ref Range Status  02/16/2023 6.1 (H) 4.8 - 5.6 % Final    Comment:             Prediabetes: 5.7 - 6.4          Diabetes: >6.4          Glycemic control for adults with diabetes: <7.0          Passed - Valid encounter within last 6  months    Recent Outpatient Visits           2 weeks ago Primary hypertension   [place], Vernia Buff, NP   3 months ago Primary hypertension   [place], NP   4 months ago Chronic systolic congestive heart failure [facility])   [place], Jarome Matin, RPH-CPP   6 months ago Essential hypertension, malignant   [place], [place], [place]   1 year ago Hyperglycemia   [place], [place]       Future Appointments             In 1 month Monge, Helane Gunther, NP [place] at [facility]   In 2 months [name], NP [place]   In 5 months Croitoru, [place], MD [place] at [facility]

## 2023-03-06 NOTE — Telephone Encounter (Signed)
Requested Prescriptions  Pending Prescriptions Disp Refills   furosemide (LASIX) 40 MG tablet [Pharmacy Med Name: Furosemide 40 MG Oral Tablet] 90 tablet 0    Sig: TAKE 1 TABLET BY MOUTH DAILY     Cardiovascular:  Diuretics - Loop Failed - 03/06/2023  7:56 AM      Failed - Cr in normal range and within 180 days    Creat  Date Value Ref Range Status  10/19/2016 1.32 0.60 - 1.35 mg/dL Final   Creatinine, Ser  Date Value Ref Range Status  02/16/2023 1.51 (H) 0.76 - 1.27 mg/dL Final   Creatinine, Urine  Date Value Ref Range Status  10/21/2014 240.9 mg/dL Final    Comment:    No reference range established. Performed at [facility]          Failed - Mg Level in normal range and within 180 days    Magnesium  Date Value Ref Range Status  08/12/2020 2.3 1.7 - 2.4 mg/dL Final    Comment:    Performed at [facility] [place] [address] [place], [place] [address]         Failed - Last BP in normal range    BP Readings from Last 1 Encounters:  02/16/23 (!) 151/96         Passed - K in normal range and within 180 days    Potassium  Date Value Ref Range Status  02/16/2023 4.5 3.5 - 5.2 mmol/L Final         Passed - Ca in normal range and within 180 days    Calcium  Date Value Ref Range Status  02/16/2023 9.5 8.7 - 10.2 mg/dL Final   Calcium, Ion  Date Value Ref Range Status  08/10/2020 1.23 1.15 - 1.40 mmol/L Final         Passed - Na in normal range and within 180 days    Sodium  Date Value Ref Range Status  02/16/2023 141 134 - 144 mmol/L Final         Passed - Cl in normal range and within 180 days    Chloride  Date Value Ref Range Status  02/16/2023 104 96 - 106 mmol/L Final         Passed - Valid encounter within last 6 months    Recent Outpatient Visits           2 weeks ago Primary hypertension   [place] Vernia Buff, NP   3 months ago Primary hypertension   [place], [place] W, NP   4 months ago Chronic systolic congestive heart failure [facility])   [place], Jarome Matin, RPH-CPP   6 months ago Essential hypertension, malignant   [place], [place], [place]   1 year ago Hyperglycemia   [place], [place]       Future Appointments             In 1 month Monge, Helane Gunther, NP [place] at [facility]   In 2 months [name], NP [place]   In 5 months Croitoru, [place], MD [place] at [facility]

## 2023-03-07 MED ORDER — CARVEDILOL 25 MG PO TABS: 37.5000 mg | ORAL_TABLET | Freq: Two times a day (BID) | ORAL | 2 refills | Status: DC

## 2023-03-15 ENCOUNTER — Encounter: Payer: Self-pay | Admitting: Emergency Medicine

## 2023-04-01 ENCOUNTER — Other Ambulatory Visit: Payer: Self-pay | Admitting: Nurse Practitioner

## 2023-04-01 DIAGNOSIS — I10 Essential (primary) hypertension: Secondary | ICD-10-CM

## 2023-04-10 ENCOUNTER — Encounter: Payer: Self-pay | Admitting: Nurse Practitioner

## 2023-04-10 ENCOUNTER — Ambulatory Visit: Payer: No Typology Code available for payment source | Attending: Nurse Practitioner | Admitting: Nurse Practitioner

## 2023-04-10 ENCOUNTER — Other Ambulatory Visit: Payer: Self-pay

## 2023-04-10 VITALS — BP 138/80 | HR 75 | Ht 73.0 in | Wt 337.0 lb

## 2023-04-10 DIAGNOSIS — N182 Chronic kidney disease, stage 2 (mild): Secondary | ICD-10-CM

## 2023-04-10 DIAGNOSIS — I130 Hypertensive heart and chronic kidney disease with heart failure and stage 1 through stage 4 chronic kidney disease, or unspecified chronic kidney disease: Secondary | ICD-10-CM

## 2023-04-10 DIAGNOSIS — I428 Other cardiomyopathies: Secondary | ICD-10-CM

## 2023-04-10 DIAGNOSIS — I5042 Chronic combined systolic (congestive) and diastolic (congestive) heart failure: Secondary | ICD-10-CM

## 2023-04-10 DIAGNOSIS — N183 Chronic kidney disease, stage 3 unspecified: Secondary | ICD-10-CM

## 2023-04-10 DIAGNOSIS — I77810 Thoracic aortic ectasia: Secondary | ICD-10-CM

## 2023-04-10 DIAGNOSIS — E782 Mixed hyperlipidemia: Secondary | ICD-10-CM

## 2023-04-10 DIAGNOSIS — I5040 Unspecified combined systolic (congestive) and diastolic (congestive) heart failure: Secondary | ICD-10-CM

## 2023-04-10 DIAGNOSIS — I351 Nonrheumatic aortic (valve) insufficiency: Secondary | ICD-10-CM

## 2023-04-10 DIAGNOSIS — R7303 Prediabetes: Secondary | ICD-10-CM

## 2023-04-10 NOTE — Patient Instructions (Addendum)
Medication Instructions:  Stop Valsartan as directed. Please take Enresto 46-51 mg twice daily  *If you need a refill on your cardiac medications before your next appointment, please call your pharmacy*   Lab Work: Your physician recommends that you return for lab work in 2 weeks Lipid panel & CMET  If you have labs (blood work) drawn today and your tests are completely normal, you will receive your results only by: MyChart Message (if you have MyChart) OR A paper copy in the mail If you have any lab test that is abnormal or we need to change your treatment, we will call you to review the results.   Testing/Procedures: NONE ordered at this time of appointment     Follow-Up: At [facility], you and your health needs are our priority.  As part of our continuing mission to provide you with exceptional heart care, we have created designated Provider Care Teams.  These Care Teams include your primary Cardiologist (physician) and Advanced Practice Providers (APPs -  Physician Assistants and Nurse Practitioners) who all work together to provide you with the care you need, when you need it.  We recommend signing up for the patient portal called "MyChart".  Sign up information is provided on this After Visit Summary.  MyChart is used to connect with patients for Virtual Visits (Telemedicine).  Patients are able to view lab/test results, encounter notes, upcoming appointments, etc.  Non-urgent messages can be sent to your provider as well.   To learn more about what you can do with MyChart, go to [email or web address].    Your next appointment:   Keep Follow up   Provider:   Thurmon Fair, MD     Other Instructions

## 2023-04-10 NOTE — Progress Notes (Signed)
Office Visit    Patient Name: Steve Carrillo Date of Encounter: 04/10/2023  Primary Care Provider:  Claiborne Rigg, NP Primary Cardiologist:  Thurmon Fair, MD  Chief Complaint    42 year old male with a history of malignant hypertension, chronic combined systolic and diastolic heart failure, NICM, ascending aorta dilation, hyperlipidemia, CKD, prediabetes, mild OSA not on CPAP, and obesity who presents for follow-up related to heart failure and hypertension.   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 Vessels are very large due to hypertensive cardiomyopathy.    RIGHT/LEFT HEART CATH AND CORONARY ANGIOGRAPHY N/A 08/10/2020   Procedure: RIGHT/LEFT HEART CATH AND CORONARY ANGIOGRAPHY;  Surgeon: Swaziland, Peter M, MD;  Location: [facility] INVASIVE CV LAB;  Service: Cardiovascular;  Laterality: N/A;    Allergies  No Known Allergies   Labs/Other Studies Reviewed    The following studies were reviewed today: R/LHC 2021:  LV end diastolic pressure is moderately elevated. Hemodynamic findings consistent with mild pulmonary hypertension.   1. Very large coronary arteries- normal. 2. Moderately elevated LV filling pressures 3. Mild pulmonary HTN.  4. Reduced cardiac output. Index 2.23   Plan: optimize medical therapy for CHF   Echo 02/2023: IMPRESSIONS     1. Left ventricular ejection fraction, by estimation, is 30 to 35%. The  left ventricle has moderately decreased  function. The left ventricle  demonstrates global hypokinesis. The left ventricular internal cavity size  was mildly to moderately dilated.  There is moderate concentric left ventricular hypertrophy. Left  ventricular diastolic parameters are consistent with Grade I diastolic  dysfunction (impaired relaxation).   2. Right ventricular systolic function is normal. The right ventricular  size is normal.   3. Left atrial size was moderately dilated.   4. The mitral valve is normal in structure. Trivial mitral valve  regurgitation. No evidence of mitral stenosis.   5. The aortic valve is normal in structure. Aortic valve regurgitation is  mild. No aortic stenosis is present.   6. Pulmonic valve regurgitation is moderate.   7. Aortic dilatation noted. There is moderate dilatation of the ascending  aorta, measuring 45 mm.   8. The inferior vena cava is normal in size with greater than 50%  respiratory variability, suggesting right atrial pressure of 3 mmHg.   IMPRESSIONS     1. Left ventricular ejection fraction, by estimation, is 30 to 35%. The  left ventricle has moderately decreased function. The left ventricle  demonstrates global hypokinesis. The left ventricular internal cavity size  was mildly to moderately dilated.  There is moderate concentric left ventricular hypertrophy. Left  ventricular diastolic parameters are consistent with Grade I diastolic  dysfunction (impaired relaxation).   2. Right ventricular systolic function is normal. The right ventricular  size is normal.   3. Left atrial size was moderately dilated.   4. The mitral valve is normal in structure. Trivial mitral valve  regurgitation. No evidence of mitral stenosis.  5. The aortic valve is normal in structure. Aortic valve regurgitation is  mild. No aortic stenosis is present.   6. Pulmonic valve regurgitation is moderate.   7. Aortic dilatation noted. There is moderate dilatation of the ascending  aorta,  measuring 45 mm.   8. The inferior vena cava is normal in size with greater than 50%  respiratory variability, suggesting right atrial pressure of 3 mmHg.   Recent Labs: 02/16/2023: ALT 21; BUN 18; Creatinine, Ser 1.51; Hemoglobin 15.4; Platelets 262; Potassium 4.5; Sodium 141  Recent Lipid Panel    Component Value Date/Time   CHOL 185 02/16/2023 1106   TRIG 212 (H) 02/16/2023 1106   HDL 44 02/16/2023 1106   CHOLHDL 4.2 02/16/2023 1106   CHOLHDL 5.5 08/06/2020 1410   VLDL 15 08/06/2020 1410   LDLCALC 104 (H) 02/16/2023 1106   LDLDIRECT 184.0 08/21/2019 0848    History of Present Illness    42 year old male with the above past medical history including malignant hypertension, chronic combined systolic and diastolic heart failure, NICM, ascending aorta dilation, hyperlipidemia, CKD, prediabetes, mild OSA not on CPAP, and obesity.  EF in 2015 was 35 to 40%.  Cardiac catheterization in 2015 showed no evidence of coronary artery disease.  Renal ultrasound in 2015 showed no evidence of renal artery stenosis.  Repeat echocardiogram in 2017 showed EF 35 to 40%, unchanged since 2015.  Sleep study revealed mild obstructive sleep apnea, CPAP was not recommended.  Repeat cardiac catheterization in 2021 showed no evidence of CAD.  He was last seen in the office on 02/06/2023 and was stable overall from a cardiac standpoint.  BP was uncontrolled.  He was restarted on Entresto, but apparently never started taking this.  Repeat echocardiogram in 02/2023 showed EF 30 to 35%, moderately decreased LV function, LV global hypokinesis, moderate concentric LVH, G1 DD, normal RV systolic function, mild AI, moderate pulmonic valve regurgitation, moderate dilation of the ascending aorta, measuring 45 mm.  He presents today for follow-up.  Since his last visit he has done well from a cardiac standpoint.  BP has been well-controlled.  He notes that he has been taking his Entresto only once daily and has continued taking  valsartan.  He has an appointment to see a nephrologist later today.  He denies any chest pain, dyspnea, edema, PND, orthopnea, weight gain.  Overall, he reports feeling well.  Home Medications    Current Outpatient Medications  Medication Sig Dispense Refill   atorvastatin (LIPITOR) 40 MG tablet Take 1 tablet (40 mg total) by mouth daily. 90 tablet 1   carvedilol (COREG) 25 MG tablet Take 1.5 tablets (37.5 mg total) by mouth 2 (two) times daily. 270 tablet 2   dapagliflozin propanediol (FARXIGA) 5 MG TABS tablet TAKE 1 TABLET BY MOUTH DAILY  BEFORE BREAKFAST 90 tablet 1   furosemide (LASIX) 40 MG tablet TAKE 1 TABLET BY MOUTH DAILY 90 tablet 0   isosorbide-hydrALAZINE (BIDIL) 20-37.5 MG tablet TAKE 1 TABLET BY MOUTH 3 TIMES  DAILY 270 tablet 1   methocarbamol (ROBAXIN) 500 MG tablet Take 2 tablets (1,000 mg total) by mouth every 8 (eight) hours as needed for muscle spasms. 90 tablet 0   sacubitril-valsartan (ENTRESTO) 49-51 MG Take 1 tablet by mouth 2 (two) times daily. 180 tablet 3   spironolactone (ALDACTONE) 25 MG tablet TAKE 1 TABLET BY MOUTH DAILY 90 tablet 1   No current facility-administered medications for this visit.     Review of Systems    He  denies chest pain, palpitations, dyspnea, pnd, orthopnea, n, v, dizziness, syncope, edema, weight gain, or early satiety. All other systems reviewed and are otherwise negative except as noted above.   Physical Exam    VS:  BP 138/80 (BP Location: Left Arm, Patient Position: Sitting, Cuff Size: Large)   Pulse 75   Ht 6\' 1"  (1.854 m)   Wt (!) 337 lb (152.9 kg)   SpO2 98%   BMI 44.46 kg/m   GEN: Well nourished, well developed, in no acute distress. HEENT: normal. Neck: Supple, no JVD, carotid bruits, or masses. Cardiac: RRR, no murmurs, rubs, or gallops. No clubbing, cyanosis, edema.  Radials/DP/PT 2+ and equal bilaterally.  Respiratory:  Respirations regular and unlabored, clear to auscultation bilaterally. GI: Soft, nontender,  nondistended, BS + x 4. MS: no deformity or atrophy. Skin: warm and dry, no rash. Neuro:  Strength and sensation are intact. Psych: Normal affect.  Accessory Clinical Findings    ECG personally reviewed by me today - No EKG in office today.    Lab Results  Component Value Date   WBC 4.7 02/16/2023   HGB 15.4 02/16/2023   HCT 45.5 02/16/2023   MCV 85 02/16/2023   PLT 262 02/16/2023   Lab Results  Component Value Date   CREATININE 1.51 (H) 02/16/2023   BUN 18 02/16/2023   NA 141 02/16/2023   K 4.5 02/16/2023   CL 104 02/16/2023   CO2 17 (L) 02/16/2023   Lab Results  Component Value Date   ALT 21 02/16/2023   AST 19 02/16/2023   ALKPHOS 98 02/16/2023   BILITOT 0.3 02/16/2023   Lab Results  Component Value Date   CHOL 185 02/16/2023   HDL 44 02/16/2023   LDLCALC 104 (H) 02/16/2023   LDLDIRECT 184.0 08/21/2019   TRIG 212 (H) 02/16/2023   CHOLHDL 4.2 02/16/2023    Lab Results  Component Value Date   HGBA1C 6.1 (H) 02/16/2023    Assessment & Plan   1. Malignant hypertension: BP well controlled.  He is only taking Entresto once daily and has been taking valsartan in addition to this.  I advised him to stop valsartan immediately and to take Entresto twice daily.  Will check CMET in 2 weeks.  Otherwise, continue current antihypertensive regimen.   2. Chronic combined systolic and diastolic heart failure/NICM: Most recent echo in 02/2023 showed EF 30 to 35%, moderately decreased LV function, LV global hypokinesis, moderate concentric LVH, G1 DD, normal RV systolic function, mild AI, moderate pulmonic valve regurgitation, moderate dilation of the ascending aorta, measuring 45 mm. Euvolemic and well compensated on exam.  Continue carvedilol, Entresto, isosorbide dinitrate-hydralazine, Farxiga, spironolactone, and Lasix.   3. Valvular heart disease: Recent echo showed mild AI, moderate pulmonic valve regurgitation.  Asymptomatic.  Will likely repeat echo in 02/21/2023 for  ongoing monitoring of aortic dilation.  4. Dilation of ascending aorta: Measured 45 mm on most recent echo in 02/2023. Consider repeat echo in 02/2023.   5. Hyperlipidemia: LDL was 104 in 02/2023.  He notes he was not fasting for these labs.  Will update fasting lipid panel, CMET.  Continue Lipitor.   6. CKD stage III: Creatinine was 1.51 in 02/2023.  He is scheduled to see nephrology later today.  7. Prediabetes: A1c was 6.1 in 02/2023.  Monitored and managed per PCP.  8. Obesity: Encouraged ongoing lifestyle modifications with diet and exercise.  9. Disposition: Follow-up as scheduled with Dr. Royann Shivers in 08/2023.      Irving Burton  Gerlean Ren, NP 04/10/2023, 9:49 AM

## 2023-05-22 ENCOUNTER — Ambulatory Visit: Payer: Self-pay | Attending: Nurse Practitioner | Admitting: Nurse Practitioner

## 2023-05-22 ENCOUNTER — Encounter: Payer: Self-pay | Admitting: Nurse Practitioner

## 2023-05-22 VITALS — BP 134/76 | HR 77 | Ht 73.0 in | Wt 336.2 lb

## 2023-05-22 DIAGNOSIS — R7303 Prediabetes: Secondary | ICD-10-CM

## 2023-05-22 DIAGNOSIS — I10 Essential (primary) hypertension: Secondary | ICD-10-CM

## 2023-05-22 NOTE — Progress Notes (Signed)
Assessment & Plan:  Steve Carrillo was seen today for hypertension.  Diagnoses and all orders for this visit:  Primary hypertension -     CMP14+EGFR Continue all antihypertensives as prescribed.  Reminded to bring in blood pressure log for follow  up appointment.  RECOMMENDATIONS: DASH/Mediterranean Diets are healthier choices for HTN.    Prediabetes -     CMP14+EGFR -     Hemoglobin A1c Continue blood sugar control as discussed in office today, low carbohydrate diet, and regular physical exercise as tolerated, 150 minutes per week (30 min each day, 5 days per week, or 50 min 3 days per week).     Patient has been counseled on age-appropriate routine health concerns for screening and prevention. These are reviewed and up-to-date. Referrals have been placed accordingly. Immunizations are up-to-date or declined.    Subjective:   Chief Complaint  Patient presents with   Hypertension   HPI Steve Carrillo 42 y.o. male presents to office today for follow up to HTN  Past medical history including malignant hypertension, chronic combined systolic and diastolic heart failure, NICM, ascending aorta dilation, hyperlipidemia, CKD, prediabetes, mild OSA not on CPAP, and obesity.   HTN Blood pressure is well controlled. He was started on valsartan as the monthly dose of entresto was over $300. Cardiology started him back on entresto last month. I will ned to get a clearer picture of his insurance situation to see if he can even afford this. He is also prescribed bidil 20-375.5 mg TID, furosemide 40 mg daily, carvedilol 37.5 mg BID and spironolactone 25 mg daily.  BP Readings from Last 3 Encounters:  05/22/23 134/76  04/10/23 138/80  02/16/23 (!) 151/96     Prediabetes Well controlled. Currently taking farxiga 5 mg daily.  Lab Results  Component Value Date   HGBA1C 6.1 (H) 02/16/2023    Review of Systems  Constitutional:  Negative for fever, malaise/fatigue and weight loss.  HENT:  Negative.  Negative for nosebleeds.   Eyes: Negative.  Negative for blurred vision, double vision and photophobia.  Respiratory: Negative.  Negative for cough and shortness of breath.   Cardiovascular: Negative.  Negative for chest pain, palpitations and leg swelling.  Gastrointestinal: Negative.  Negative for heartburn, nausea and vomiting.  Musculoskeletal: Negative.  Negative for myalgias.  Neurological: Negative.  Negative for dizziness, focal weakness, seizures and headaches.  Psychiatric/Behavioral: Negative.  Negative for suicidal ideas.     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 Vessels are very large due to hypertensive cardiomyopathy.    RIGHT/LEFT HEART CATH AND CORONARY ANGIOGRAPHY N/A 08/10/2020   Procedure: RIGHT/LEFT HEART CATH AND CORONARY ANGIOGRAPHY;  Surgeon: Swaziland, Peter M, MD;  Location: [facility] INVASIVE CV LAB;  Service: Cardiovascular;  Laterality: N/A;    Family History  Problem Relation Age of Onset   Diabetes Mellitus II Mother    Hypertension Mother    Hypertension Brother    Diabetes Mellitus II Maternal Grandmother  Social History Reviewed with no changes to be made today.   Outpatient Medications Prior to Visit  Medication Sig Dispense Refill   atorvastatin (LIPITOR) 40 MG tablet Take 1 tablet (40 mg total) by mouth daily. 90 tablet 1   carvedilol (COREG) 25 MG tablet Take 1.5 tablets (37.5 mg total) by mouth 2 (two) times daily. 270 tablet 2   dapagliflozin  propanediol (FARXIGA) 5 MG TABS tablet TAKE 1 TABLET BY MOUTH DAILY  BEFORE BREAKFAST 90 tablet 1   furosemide (LASIX) 40 MG tablet TAKE 1 TABLET BY MOUTH DAILY 90 tablet 0   isosorbide-hydrALAZINE (BIDIL) 20-37.5 MG tablet TAKE 1 TABLET BY MOUTH 3 TIMES  DAILY 270 tablet 1   methocarbamol (ROBAXIN) 500 MG tablet Take 2 tablets (1,000 mg total) by mouth every 8 (eight) hours as needed for muscle spasms. 90 tablet 0   sacubitril-valsartan (ENTRESTO) 49-51 MG Take 1 tablet by mouth 2 (two) times daily. 180 tablet 3   spironolactone (ALDACTONE) 25 MG tablet TAKE 1 TABLET BY MOUTH DAILY 90 tablet 1   No facility-administered medications prior to visit.    No Known Allergies     Objective:    BP 134/76 (BP Location: Left Arm, Patient Position: Sitting, Cuff Size: Large)   Pulse 77   Ht 6\' 1"  (1.854 m)   Wt (!) 336 lb 3.2 oz (152.5 kg)   SpO2 100%   BMI 44.36 kg/m  Wt Readings from Last 3 Encounters:  05/22/23 (!) 336 lb 3.2 oz (152.5 kg)  04/10/23 (!) 337 lb (152.9 kg)  02/16/23 (!) 337 lb 9.6 oz (153.1 kg)    Physical Exam Vitals and nursing note reviewed.  Constitutional:      Appearance: He is well-developed.  HENT:     Head: Normocephalic and atraumatic.  Cardiovascular:     Rate and Rhythm: Normal rate and regular rhythm.     Heart sounds: Normal heart sounds. No murmur heard.    No friction rub. No gallop.  Pulmonary:     Effort: Pulmonary effort is normal. No tachypnea or respiratory distress.     Breath sounds: Normal breath sounds. No decreased breath sounds, wheezing, rhonchi or rales.  Chest:     Chest wall: No tenderness.  Abdominal:     General: Bowel sounds are normal.     Palpations: Abdomen is soft.  Musculoskeletal:        General: Normal range of motion.     Cervical back: Normal range of motion.  Skin:    General: Skin is warm and dry.  Neurological:     Mental Status: He is alert and oriented to person, place, and time.     Coordination:  Coordination normal.  Psychiatric:        Behavior: Behavior normal. Behavior is cooperative.        Thought Content: Thought content normal.        Judgment: Judgment normal.          Patient has been counseled extensively about nutrition and exercise as well as the importance of adherence with medications and regular follow-up. The patient was given clear instructions to go to ER or return to medical center if symptoms don't improve, worsen or new problems develop. The patient verbalized understanding.   Follow-up: Return in about 3 months (around 08/31/2023) for HTN.   Claiborne Rigg, FNP-BC [facility] [place], [place] [phone]   05/22/2023, 9:51 PM

## 2023-05-23 LAB — CMP14+EGFR
ALT: 64 IU/L — ABNORMAL HIGH (ref 0–44)
AST: 64 IU/L — ABNORMAL HIGH (ref 0–40)
Albumin: 4.1 g/dL (ref 4.1–5.1)
Alkaline Phosphatase: 94 IU/L (ref 44–121)
BUN/Creatinine Ratio: 9 (ref 9–20)
BUN: 14 mg/dL (ref 6–24)
Bilirubin Total: 0.4 mg/dL (ref 0.0–1.2)
CO2: 22 mmol/L (ref 20–29)
Calcium: 9.5 mg/dL (ref 8.7–10.2)
Chloride: 106 mmol/L (ref 96–106)
Creatinine, Ser: 1.54 mg/dL — ABNORMAL HIGH (ref 0.76–1.27)
Globulin, Total: 2.4 g/dL (ref 1.5–4.5)
Glucose: 99 mg/dL (ref 70–99)
Potassium: 4.2 mmol/L (ref 3.5–5.2)
Sodium: 142 mmol/L (ref 134–144)
Total Protein: 6.5 g/dL (ref 6.0–8.5)
eGFR: 58 mL/min/{1.73_m2} — ABNORMAL LOW (ref >59)

## 2023-05-23 LAB — HEMOGLOBIN A1C
Est. average glucose Bld gHb Est-mCnc: 126 mg/dL
Hgb A1c MFr Bld: 6 % — ABNORMAL HIGH (ref 4.8–5.6)

## 2023-06-20 ENCOUNTER — Encounter (INDEPENDENT_AMBULATORY_CARE_PROVIDER_SITE_OTHER): Payer: Self-pay | Admitting: Internal Medicine

## 2023-07-02 ENCOUNTER — Telehealth: Payer: Self-pay | Admitting: Nurse Practitioner

## 2023-07-02 ENCOUNTER — Other Ambulatory Visit: Payer: Self-pay

## 2023-07-02 DIAGNOSIS — I10 Essential (primary) hypertension: Secondary | ICD-10-CM

## 2023-07-02 DIAGNOSIS — I5022 Chronic systolic (congestive) heart failure: Secondary | ICD-10-CM

## 2023-07-02 DIAGNOSIS — M62838 Other muscle spasm: Secondary | ICD-10-CM

## 2023-07-02 DIAGNOSIS — E785 Hyperlipidemia, unspecified: Secondary | ICD-10-CM

## 2023-07-02 DIAGNOSIS — E876 Hypokalemia: Secondary | ICD-10-CM

## 2023-07-02 MED ORDER — ISOSORB DINITRATE-HYDRALAZINE 20-37.5 MG PO TABS
1.0000 | ORAL_TABLET | Freq: Three times a day (TID) | ORAL | 1 refills | Status: DC
Start: 2023-07-02 — End: 2023-07-02

## 2023-07-02 MED ORDER — DAPAGLIFLOZIN PROPANEDIOL 5 MG PO TABS
5.0000 mg | ORAL_TABLET | Freq: Every day | ORAL | 1 refills | Status: DC
Start: 2023-07-02 — End: 2024-02-15
  Filled 2023-07-02: qty 30, 30d supply, fill #0
  Filled 2023-10-09: qty 30, 30d supply, fill #1
  Filled 2023-11-16: qty 30, 30d supply, fill #2
  Filled 2023-12-19: qty 30, 30d supply, fill #3
  Filled 2024-01-14: qty 30, 30d supply, fill #4

## 2023-07-02 MED ORDER — DAPAGLIFLOZIN PROPANEDIOL 5 MG PO TABS
5.0000 mg | ORAL_TABLET | Freq: Every day | ORAL | 1 refills | Status: DC
Start: 2023-07-02 — End: 2023-07-02

## 2023-07-02 MED ORDER — CARVEDILOL 25 MG PO TABS
37.5000 mg | ORAL_TABLET | Freq: Two times a day (BID) | ORAL | 1 refills | Status: DC
Start: 2023-07-02 — End: 2024-02-15
  Filled 2023-07-02: qty 270, 90d supply, fill #0
  Filled 2023-10-09: qty 270, 90d supply, fill #1

## 2023-07-02 MED ORDER — ATORVASTATIN CALCIUM 40 MG PO TABS
40.0000 mg | ORAL_TABLET | Freq: Every day | ORAL | 1 refills | Status: DC
Start: 2023-07-02 — End: 2024-02-15
  Filled 2023-07-02: qty 90, 90d supply, fill #0
  Filled 2023-10-09: qty 90, 90d supply, fill #1

## 2023-07-02 MED ORDER — CARVEDILOL 25 MG PO TABS
37.5000 mg | ORAL_TABLET | Freq: Two times a day (BID) | ORAL | 1 refills | Status: DC
Start: 2023-07-02 — End: 2023-07-02

## 2023-07-02 MED ORDER — SACUBITRIL-VALSARTAN 49-51 MG PO TABS: 1.0000 | ORAL_TABLET | Freq: Two times a day (BID) | ORAL | 1 refills | Status: DC

## 2023-07-02 MED ORDER — FUROSEMIDE 40 MG PO TABS
40.0000 mg | ORAL_TABLET | Freq: Every day | ORAL | 1 refills | Status: DC
Start: 2023-07-02 — End: 2023-07-02

## 2023-07-02 MED ORDER — FUROSEMIDE 40 MG PO TABS
40.0000 mg | ORAL_TABLET | Freq: Every day | ORAL | 1 refills | Status: DC
Start: 2023-07-02 — End: 2024-01-04
  Filled 2023-07-02: qty 90, 90d supply, fill #0
  Filled 2023-10-09: qty 90, 90d supply, fill #1

## 2023-07-02 MED ORDER — ISOSORB DINITRATE-HYDRALAZINE 20-37.5 MG PO TABS
1.0000 | ORAL_TABLET | Freq: Three times a day (TID) | ORAL | 1 refills | Status: DC
  Filled 2023-07-02: qty 90, 30d supply, fill #0
  Filled 2023-10-09 – 2023-12-19 (×2): qty 90, 30d supply, fill #1
  Filled 2024-01-14: qty 90, 30d supply, fill #2
  Filled 2024-04-01: qty 90, 30d supply, fill #3
  Filled 2024-05-13 – 2024-06-29 (×2): qty 90, 30d supply, fill #4

## 2023-07-02 MED ORDER — SACUBITRIL-VALSARTAN 49-51 MG PO TABS
1.0000 | ORAL_TABLET | Freq: Two times a day (BID) | ORAL | 1 refills | Status: DC
  Filled 2023-07-02: qty 60, 30d supply, fill #0

## 2023-07-02 MED ORDER — SPIRONOLACTONE 25 MG PO TABS
25.0000 mg | ORAL_TABLET | Freq: Every day | ORAL | 1 refills | Status: DC
Start: 2023-07-02 — End: 2023-07-02

## 2023-07-02 MED ORDER — SPIRONOLACTONE 25 MG PO TABS
25.0000 mg | ORAL_TABLET | Freq: Every day | ORAL | 1 refills | Status: DC
  Filled 2023-07-02: qty 90, 90d supply, fill #0
  Filled 2023-10-09: qty 90, 90d supply, fill #1

## 2023-07-02 MED ORDER — METHOCARBAMOL 500 MG PO TABS: 1000.0000 mg | ORAL_TABLET | Freq: Three times a day (TID) | ORAL | 0 refills | Status: AC | PRN

## 2023-07-02 MED ORDER — ATORVASTATIN CALCIUM 40 MG PO TABS
40.0000 mg | ORAL_TABLET | Freq: Every day | ORAL | 1 refills | Status: DC
Start: 2023-07-02 — End: 2023-07-02

## 2023-07-02 NOTE — Telephone Encounter (Signed)
Directed patient to [facility]. They may have samples and will need to assist him in MAP for Entresto.

## 2023-07-02 NOTE — Telephone Encounter (Signed)
Pt is needing help getting his medication:  sacubitril-valsartan (ENTRESTO) 49-51 MG   Per pt he does not have any insurance and cannot afford to pay for his medication and is wanting to see if his PCP could help him. Pt would like a call back to discuss.

## 2023-07-03 ENCOUNTER — Other Ambulatory Visit: Payer: Self-pay

## 2023-07-03 ENCOUNTER — Telehealth: Payer: Self-pay | Admitting: Nurse Practitioner

## 2023-07-03 NOTE — Telephone Encounter (Signed)
Copied from CRM [id number]. Topic: General - Other >> Jul 02, 2023  1:38 PM Phill Myron wrote: Attn: Franky Macho Mr. Pottorf states he is returning your call

## 2023-07-03 NOTE — Telephone Encounter (Signed)
Patient was contacted yesterday at 5pm. I instructed him to come to our pharmacy to pick-up his Entresto.

## 2023-08-10 ENCOUNTER — Ambulatory Visit: Payer: 59 | Attending: Cardiovascular Disease | Admitting: Cardiovascular Disease

## 2023-08-10 ENCOUNTER — Encounter: Payer: Self-pay | Admitting: Cardiovascular Disease

## 2023-08-10 ENCOUNTER — Other Ambulatory Visit: Payer: Self-pay

## 2023-08-10 VITALS — BP 138/92 | HR 81 | Ht 73.0 in | Wt 346.6 lb

## 2023-08-10 DIAGNOSIS — I130 Hypertensive heart and chronic kidney disease with heart failure and stage 1 through stage 4 chronic kidney disease, or unspecified chronic kidney disease: Secondary | ICD-10-CM | POA: Diagnosis not present

## 2023-08-10 DIAGNOSIS — N1831 Chronic kidney disease, stage 3a: Secondary | ICD-10-CM | POA: Diagnosis not present

## 2023-08-10 DIAGNOSIS — I110 Hypertensive heart disease with heart failure: Secondary | ICD-10-CM

## 2023-08-10 DIAGNOSIS — I5042 Chronic combined systolic (congestive) and diastolic (congestive) heart failure: Secondary | ICD-10-CM

## 2023-08-10 DIAGNOSIS — E782 Mixed hyperlipidemia: Secondary | ICD-10-CM

## 2023-08-10 DIAGNOSIS — N183 Chronic kidney disease, stage 3 unspecified: Secondary | ICD-10-CM

## 2023-08-10 DIAGNOSIS — I5040 Unspecified combined systolic (congestive) and diastolic (congestive) heart failure: Secondary | ICD-10-CM

## 2023-08-10 MED ORDER — SACUBITRIL-VALSARTAN 49-51 MG PO TABS
1.0000 | ORAL_TABLET | Freq: Two times a day (BID) | ORAL | 3 refills | Status: DC
  Filled 2023-08-10 – 2023-08-27 (×2): qty 180, 90d supply, fill #0
  Filled 2023-11-16: qty 180, 90d supply, fill #1
  Filled 2024-01-21 – 2024-01-28 (×3): qty 180, 90d supply, fill #2
  Filled 2024-04-01 – 2024-04-18 (×2): qty 180, 90d supply, fill #3

## 2023-08-10 NOTE — Patient Instructions (Signed)
Medication Instructions:  No changes *If you need a refill on your cardiac medications before your next appointment, please call your pharmacy*  Follow-Up: At [facility], you and your health needs are our priority.  As part of our continuing mission to provide you with exceptional heart care, we have created designated Provider Care Teams.  These Care Teams include your primary Cardiologist (physician) and Advanced Practice Providers (APPs -  Physician Assistants and Nurse Practitioners) who all work together to provide you with the care you need, when you need it.  We recommend signing up for the patient portal called "MyChart".  Sign up information is provided on this After Visit Summary.  MyChart is used to connect with patients for Virtual Visits (Telemedicine).  Patients are able to view lab/test results, encounter notes, upcoming appointments, etc.  Non-urgent messages can be sent to your provider as well.   To learn more about what you can do with MyChart, go to [email or web address].    Your next appointment:   6 months with APP  1 yr with Dr Royann Shivers

## 2023-08-10 NOTE — Progress Notes (Signed)
.    Cardiology Office Note    Date:  08/10/2023   ID:  Steve Carrillo, DOB January 25, 1981, MRN [id number]  PCP:  Claiborne Rigg, NP  Cardiologist:   Thurmon Fair, MD   Chief Complaint  Patient presents with   Congestive Heart Failure    History of Present Illness:  Steve Carrillo is a 42 y.o. male with severe systemic hypertension, hypertensive heart disease (left ventricular hypertrophy and moderately depressed left ventricular systolic function) with compensated heart failure, mild-moderate mitral insufficiency.  He did not have any coronary artery disease by cardiac catheterization 2021.  With improved blood pressure control there is been improvement in his left ventricular systolic function and his clinical status, his EF has increased to 30-35% and he has NYHA functional class I status.  He reports that at home his blood pressure has been well-controlled in the 130/70 range, but he ran out of his Sherryll Burger about 3 days ago.  His blood pressure on presentation was high at 146/104, but improved to 130/92 after he relaxed for about 10 minutes.  It takes a lot to keep the blood pressure under control: Carvedilol 37.5 mg twice daily, BiDil 3 times daily, Entresto 49/51 mg daily, spironolactone 25 mg daily.  He is also on [place] and statin.  The patient specifically denies any chest pain at rest exertion, dyspnea at rest or with exertion, orthopnea, paroxysmal nocturnal dyspnea, syncope, palpitations, focal neurological deficits, intermittent claudication, lower extremity edema, unexplained weight gain, cough, hemoptysis or wheezing.   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   His echocardiogram in 2017 showed an ejection fraction of 35-40%, unchanged since 2015.  Catheterization did not show any evidence of coronary disease in 2015 or 2021.  Sleep study showed very  mild obstructive sleep apnea and CPAP was not recommended.  His echocardiogram on 02/27/2023 showed LVEF 30-35%.   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 Vessels are very large due to hypertensive cardiomyopathy.    RIGHT/LEFT HEART CATH AND CORONARY ANGIOGRAPHY N/A 08/10/2020   Procedure: RIGHT/LEFT HEART CATH AND CORONARY ANGIOGRAPHY;  Surgeon: Swaziland, Peter M, MD;  Location: [facility] INVASIVE CV LAB;  Service: Cardiovascular;  Laterality: N/A;    Current Medications: Outpatient Medications Prior to Visit  Medication Sig Dispense Refill   atorvastatin (LIPITOR) 40 MG tablet Take 1 tablet (40 mg total) by mouth daily. 90 tablet 1   carvedilol (COREG) 25 MG tablet Take 1.5 tablets (37.5 mg total) by mouth 2 (two) times daily. 270 tablet 1   dapagliflozin propanediol (FARXIGA) 5 MG TABS tablet Take 1 tablet (5 mg total) by mouth daily before breakfast. 90 tablet 1   furosemide (LASIX) 40 MG tablet Take 1 tablet (40 mg total) by mouth daily. 90 tablet 1   isosorbide-hydrALAZINE (BIDIL) 20-37.5 MG tablet Take 1 tablet by mouth 3 (three) times daily.  270 tablet 1   methocarbamol (ROBAXIN) 500 MG tablet Take 2 tablets (1,000 mg total) by mouth every 8 (eight) hours as needed for muscle spasms. 90 tablet 0   spironolactone (ALDACTONE) 25 MG tablet Take 1 tablet (25 mg total) by mouth daily. 90 tablet 1   sacubitril-valsartan (ENTRESTO) 49-51 MG Take 1 tablet by mouth 2 (two) times daily. (Patient not taking:  Reported on 08/10/2023) 180 tablet 1   No facility-administered medications prior to visit.     Allergies:   Patient has no known allergies.   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    Social Determinants of Health   Financial Resource Strain: Not on file  Food Insecurity: Not on file  Transportation Needs: Not on file  Physical Activity: Not on file  Stress: Not on file  Social Connections: Not on file     Family History:  The patient's family history includes Diabetes Mellitus II in his maternal grandmother and mother; Hypertension in his brother and mother.   ROS:   Please see the history of present illness.    ROS all other systems are reviewed and are negative   PHYSICAL EXAM:   VS:  BP (!) 138/92   Pulse 81   Ht 6\' 1"  (1.854 m)   Wt (!) 346 lb 9.6 oz (157.2 kg)   SpO2 96%   BMI 45.73 kg/m      General: Alert, oriented x3, no distress, morbidly obese Head: no evidence of trauma, PERRL, EOMI, no exophtalmos or lid lag, no myxedema, no xanthelasma; normal ears, nose and oropharynx Neck: normal jugular venous pulsations and no hepatojugular reflux; brisk carotid pulses without delay and no carotid bruits Chest: clear to auscultation,  no signs of consolidation by percussion or palpation, normal fremitus, symmetrical and full respiratory excursions Cardiovascular: normal position and quality of the apical impulse, regular rhythm, normal first and second heart sounds, no murmurs, rubs or gallops Abdomen: no tenderness or distention, no masses by palpation, no abnormal pulsatility or arterial bruits, normal bowel sounds, no hepatosplenomegaly Extremities: no clubbing, cyanosis or edema; 2+ radial, ulnar and brachial pulses bilaterally; 2+ right femoral, posterior tibial and dorsalis pedis pulses; 2+ left femoral, posterior tibial and dorsalis pedis pulses; no subclavian or femoral bruits Neurological: grossly nonfocal Psych: Normal mood and affect     Wt Readings from Last 3 Encounters:  08/10/23 (!) 346 lb 9.6 oz (157.2 kg)  05/22/23 (!) 336 lb 3.2 oz (152.5 kg)  04/10/23 (!) 337 lb (152.9 kg)      Studies/Labs Reviewed:   EKG:  EKG is ordered today.  Similar to previous tracings that shows normal sinus rhythm, left atrial enlargement, left ventricular hypertrophy with voltage masked by obesity and with typical repolarization abnormalities.  QTc is shorter than the past, only borderline prolonged at 466 ms.  Recent Labs: 02/16/2023: Hemoglobin 15.4; Platelets 262 05/22/2023: ALT 64; BUN 14; Creatinine, Ser 1.54; Potassium 4.2; Sodium 142   Lipid Panel    Component Value Date/Time   CHOL 185 02/16/2023 1106   TRIG 212 (H) 02/16/2023 1106   HDL 44 02/16/2023 1106   CHOLHDL 4.2 02/16/2023 1106   CHOLHDL 5.5 08/06/2020 1410   VLDL 15 08/06/2020 1410   LDLCALC 104 (H) 02/16/2023 1106   LDLDIRECT 184.0 08/21/2019 0848    ASSESSMENT:    1. Chronic combined systolic and diastolic heart failure (HCC)   2. Hypertensive heart disease with heart failure (HCC)   3. 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 Stage 3a chronic kidney  disease (HCC)   5. Mixed hyperlipidemia   6. Morbid obesity (HCC)      PLAN:  In order of problems listed above:  CHF: NYHA functional class I.  Clinically euvolemic.   Most likely has cardiomyopathy due to malignant hypertension which is not well compensated.  Reviewed the importance of avoiding beta-blocker rebound.  Blood pressure slightly high today, but he has been off Entresto for a few days.  Should be back in normal range once we restart that. Malignant hypertension: Blood pressure control is poor.  Medications adjusted today. CKD: Also due to severe hypertension HLP: Severe hypercholesterolemia and mild hypertriglyceridemia at baseline, markedly improved after we restarted a statin.  Hemoglobin A1c 6% consistent with "prediabetes". Morbid obesity: Continue to try to lose weight aggressively.  Discussed diet and exercise.  He has recently started going to the gym several days a week.  Reviewed the importance of aerobic versus anaerobic exercise.  He only had mild evidence of apnea/hypopnea on his sleep study and CPAP was not recommended.      Medication Adjustments/Labs and Tests Ordered: Current medicines are reviewed at length with the patient today.  Concerns regarding medicines are outlined above.  Medication changes, Labs and Tests ordered today are listed in the Patient Instructions below. Patient Instructions  Medication Instructions:  No changes *If you need a refill on your cardiac medications before your next appointment, please call your pharmacy*  Follow-Up: At [facility], you and your health needs are our priority.  As part of our continuing mission to provide you with exceptional heart care, we have created designated Provider Care Teams.  These Care Teams include your primary Cardiologist (physician) and Advanced Practice Providers (APPs -  Physician Assistants and Nurse Practitioners) who all work together to provide you with the care you need, when you  need it.  We recommend signing up for the patient portal called "MyChart".  Sign up information is provided on this After Visit Summary.  MyChart is used to connect with patients for Virtual Visits (Telemedicine).  Patients are able to view lab/test results, encounter notes, upcoming appointments, etc.  Non-urgent messages can be sent to your provider as well.   To learn more about what you can do with MyChart, go to [email or web address].    Your next appointment:   6 months with APP  1 yr with Dr Royann Shivers    Signed, Thurmon Fair, MD  08/10/2023 1:09 PM  [facility] [address] [place], [place], [place]  [address] Phone: [phone]; Fax: [phone]

## 2023-08-27 ENCOUNTER — Other Ambulatory Visit: Payer: Self-pay

## 2023-08-28 ENCOUNTER — Other Ambulatory Visit: Payer: Self-pay

## 2023-09-28 ENCOUNTER — Ambulatory Visit: Payer: 59 | Admitting: Family Medicine

## 2023-09-28 ENCOUNTER — Encounter: Payer: Self-pay | Admitting: Family Medicine

## 2023-09-28 VITALS — BP 120/76 | HR 78 | Temp 98.6°F | Ht 73.0 in | Wt 344.2 lb

## 2023-09-28 DIAGNOSIS — I10 Essential (primary) hypertension: Secondary | ICD-10-CM

## 2023-09-28 DIAGNOSIS — Z6841 Body Mass Index (BMI) 40.0 and over, adult: Secondary | ICD-10-CM

## 2023-09-28 DIAGNOSIS — R7303 Prediabetes: Secondary | ICD-10-CM | POA: Diagnosis not present

## 2023-09-28 DIAGNOSIS — G4733 Obstructive sleep apnea (adult) (pediatric): Secondary | ICD-10-CM | POA: Diagnosis not present

## 2023-09-28 DIAGNOSIS — I5022 Chronic systolic (congestive) heart failure: Secondary | ICD-10-CM

## 2023-09-28 DIAGNOSIS — E785 Hyperlipidemia, unspecified: Secondary | ICD-10-CM

## 2023-09-28 DIAGNOSIS — Z23 Encounter for immunization: Secondary | ICD-10-CM | POA: Diagnosis not present

## 2023-09-28 LAB — COMPREHENSIVE METABOLIC PANEL
ALT: 21 U/L (ref 0–53)
AST: 21 U/L (ref 0–37)
Albumin: 4.3 g/dL (ref 3.5–5.2)
Alkaline Phosphatase: 74 U/L (ref 39–117)
BUN: 17 mg/dL (ref 6–23)
CO2: 26 meq/L (ref 19–32)
Calcium: 9.4 mg/dL (ref 8.4–10.5)
Chloride: 105 meq/L (ref 96–112)
Creatinine, Ser: 1.61 mg/dL — ABNORMAL HIGH (ref 0.40–1.50)
GFR: 52.67 mL/min — ABNORMAL LOW (ref >60.00)
Glucose, Bld: 97 mg/dL (ref 70–99)
Potassium: 3.9 meq/L (ref 3.5–5.1)
Sodium: 139 meq/L (ref 135–145)
Total Bilirubin: 0.7 mg/dL (ref 0.2–1.2)
Total Protein: 7.2 g/dL (ref 6.0–8.3)

## 2023-09-28 LAB — TSH: TSH: 1.13 u[IU]/mL (ref 0.35–5.50)

## 2023-09-28 LAB — LIPID PANEL
Cholesterol: 141 mg/dL (ref 0–200)
HDL: 34.7 mg/dL — ABNORMAL LOW (ref >39.00)
LDL Cholesterol: 73 mg/dL (ref 0–99)
NonHDL: 106.69
Total CHOL/HDL Ratio: 4
Triglycerides: 166 mg/dL — ABNORMAL HIGH (ref 0.0–149.0)
VLDL: 33.2 mg/dL (ref 0.0–40.0)

## 2023-09-28 LAB — CBC WITH DIFFERENTIAL/PLATELET
Basophils Absolute: 0.1 10*3/uL (ref 0.0–0.1)
Basophils Relative: 1.2 % (ref 0.0–3.0)
Eosinophils Absolute: 0.2 10*3/uL (ref 0.0–0.7)
Eosinophils Relative: 3.5 % (ref 0.0–5.0)
HCT: 45.3 % (ref 39.0–52.0)
Hemoglobin: 14.5 g/dL (ref 13.0–17.0)
Lymphocytes Relative: 37.6 % (ref 12.0–46.0)
Lymphs Abs: 2.1 10*3/uL (ref 0.7–4.0)
MCHC: 32 g/dL (ref 30.0–36.0)
MCV: 90.6 fL (ref 78.0–100.0)
Monocytes Absolute: 0.6 10*3/uL (ref 0.1–1.0)
Monocytes Relative: 10.5 % (ref 3.0–12.0)
Neutro Abs: 2.6 10*3/uL (ref 1.4–7.7)
Neutrophils Relative %: 47.2 % (ref 43.0–77.0)
Platelets: 236 10*3/uL (ref 150.0–400.0)
RBC: 5 Mil/uL (ref 4.22–5.81)
RDW: 14.3 % (ref 11.5–15.5)
WBC: 5.6 10*3/uL (ref 4.0–10.5)

## 2023-09-28 LAB — HEMOGLOBIN A1C: Hgb A1c MFr Bld: 6.3 % (ref 4.6–6.5)

## 2023-09-28 LAB — T4, FREE: Free T4: 0.83 ng/dL (ref 0.60–1.60)

## 2023-09-28 NOTE — Patient Instructions (Signed)
Please go downstairs for labs before you leave.   We will be in touch with your results.

## 2023-09-28 NOTE — Progress Notes (Unsigned)
New Patient Office Visit  Subjective    Patient ID: Steve Carrillo, male    DOB: 1981-06-22  Age: 42 y.o. MRN: [id number]  CC:  Chief Complaint  Patient presents with   Establish Care    Establish Care, no concerns. Requesting physical/look at over all health    HPI Steve Carrillo presents to establish care Under the care of ID for HIV  Cardiologist- manages HTN and CHF  Hx of cath   HLD- taking statin   Hx of prediabetes   Works in a warehouse and with [employer]   No kids, married      09/28/2023    3:27 PM 05/22/2023    9:21 AM 11/10/2022    9:10 AM 08/10/2022    9:11 AM 03/03/2021    9:04 AM  Depression screen PHQ 2/9  Decreased Interest 0 0 0 2 2  Down, Depressed, Hopeless 0 0 2 0 0  PHQ - 2 Score 0 0 2 2 2   Altered sleeping  0 1 0 0  Tired, decreased energy  0 1 2 0  Change in appetite  0 1 1 0  Feeling bad or failure about yourself   0 0 0 0  Trouble concentrating  0 0 0 0  Moving slowly or fidgety/restless  0 0 0 0  Suicidal thoughts  0 0 0 0  PHQ-9 Score  0 5 5 2       Outpatient Encounter Medications as of 09/28/2023  Medication Sig   atorvastatin (LIPITOR) 40 MG tablet Take 1 tablet (40 mg total) by mouth daily.   carvedilol (COREG) 25 MG tablet Take 1.5 tablets (37.5 mg total) by mouth 2 (two) times daily.   dapagliflozin propanediol (FARXIGA) 5 MG TABS tablet Take 1 tablet (5 mg total) by mouth daily before breakfast.   furosemide (LASIX) 40 MG tablet Take 1 tablet (40 mg total) by mouth daily.   isosorbide-hydrALAZINE (BIDIL) 20-37.5 MG tablet Take 1 tablet by mouth 3 (three) times daily.   methocarbamol (ROBAXIN) 500 MG tablet Take 2 tablets (1,000 mg total) by mouth every 8 (eight) hours as needed for muscle spasms.   sacubitril-valsartan (ENTRESTO) 49-51 MG Take 1 tablet by mouth 2 (two) times daily.   spironolactone (ALDACTONE) 25 MG tablet Take 1 tablet (25 mg total) by mouth daily.   No facility-administered encounter medications on file as  of 09/28/2023.    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 Vessels are very large due to hypertensive cardiomyopathy.    RIGHT/LEFT HEART CATH AND CORONARY ANGIOGRAPHY N/A 08/10/2020   Procedure: RIGHT/LEFT HEART CATH AND CORONARY ANGIOGRAPHY;  Surgeon: Swaziland, Peter M, MD;  Location: [facility] INVASIVE CV LAB;  Service: Cardiovascular;  Laterality: N/A;    Family History  Problem Relation Age of Onset   Diabetes Mellitus II Mother    Hypertension Mother    Hypertension Brother    Diabetes Mellitus II Maternal Grandmother  Social History   Socioeconomic History   Marital status: Married    Spouse name: Not on file   Number of children: Not on file   Years of education: Not on file   Highest education level: Not on file  Occupational History   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    Social Determinants of Health   Financial Resource Strain: Not on file  Food Insecurity: Not on file   Transportation Needs: Not on file  Physical Activity: Not on file  Stress: Not on file  Social Connections: Unknown (08/20/2023)   Received from [facility]   Social Network    Social Network: Not on file  Intimate Partner Violence: Unknown (08/20/2023)   Received from [facility]   HITS    Physically Hurt: Not on file    Insult or Talk Down To: Not on file    Threaten Physical Harm: Not on file    Scream or Curse: Not on file    Review of Systems  Constitutional:  Negative for chills, fever, malaise/fatigue and weight loss.  Respiratory:  Negative for cough and shortness of breath.   Cardiovascular:  Negative for chest pain, palpitations and leg swelling.  Gastrointestinal:  Negative for abdominal pain, constipation, diarrhea, nausea and vomiting.  Genitourinary:  Negative for dysuria, frequency and urgency.  Musculoskeletal:  Positive for back pain.  Neurological:  Negative for dizziness, focal weakness and headaches.  Psychiatric/Behavioral:  Negative for depression. The patient is not nervous/anxious.         Objective    BP 120/76   Pulse 78   Temp 98.6 F (37 C) (Oral)   Ht 6\' 1"  (1.854 m)   Wt (!) 344 lb 3.2 oz (156.1 kg)   SpO2 95%   BMI 45.41 kg/m   Physical Exam Constitutional:      General: He is not in acute distress.    Appearance: He is obese. He is not ill-appearing.  Eyes:     Extraocular Movements: Extraocular movements intact.     Conjunctiva/sclera: Conjunctivae normal.  Cardiovascular:     Rate and Rhythm: Normal rate and regular rhythm.  Pulmonary:     Effort: Pulmonary effort is normal.     Breath sounds: Normal breath sounds.  Musculoskeletal:     Cervical back: Normal range of motion and neck supple.     Right lower leg: No edema.     Left lower leg: No edema.  Skin:    General: Skin is warm and dry.  Neurological:     General: No focal deficit present.     Mental Status: He is alert and oriented to person, place, and time.      Motor: No weakness.     Coordination: Coordination normal.  Psychiatric:        Mood and Affect: Mood normal.  Behavior: Behavior normal.        Thought Content: Thought content normal.         Assessment & Plan:   Problem List Items Addressed This Visit       Cardiovascular and Mediastinum   Chronic systolic congestive heart failure (HCC)    Follow up with cardiology. Aware of need to monitor weight gain and fluid retention.       Essential hypertension, malignant    Under the care of cardiology. Encourage good medication compliance. BP in goal today. Check renal function.       Relevant Orders   CBC with Differential/Platelet (Completed)   Comprehensive metabolic panel (Completed)     Respiratory   OSA (obstructive sleep apnea)    Hx of mild OSA and no CPAP. May need to re-evaluate         Other   Hyperlipidemia    Continue statin therapy       Relevant Orders   Lipid panel (Completed)   Morbid obesity (HCC)    Will need to further discuss at 4 wk follow up. Consider referral to [facility]. ?GLP-1      Relevant Orders   CBC with Differential/Platelet (Completed)   Comprehensive metabolic panel (Completed)   Hemoglobin A1c (Completed)   Lipid panel (Completed)   TSH (Completed)   T4, free (Completed)   Prediabetes - Primary    Check A1c. He is on [place] for HF      Relevant Orders   CBC with Differential/Platelet (Completed)   Comprehensive metabolic panel (Completed)   Hemoglobin A1c (Completed)   TSH (Completed)   T4, free (Completed)   Other Visit Diagnoses     Need for influenza vaccination       Relevant Orders   Flu vaccine trivalent PF, 6mos and older(Flulaval,Afluria,Fluarix,Fluzone) (Completed)       Return in about 4 weeks (around 10/26/2023).   Hetty Blend, NP-C

## 2023-10-01 DIAGNOSIS — I5022 Chronic systolic (congestive) heart failure: Secondary | ICD-10-CM | POA: Insufficient documentation

## 2023-10-01 NOTE — Assessment & Plan Note (Signed)
Continue statin therapy.

## 2023-10-01 NOTE — Assessment & Plan Note (Signed)
Check A1c. He is on [place] for HF

## 2023-10-01 NOTE — Assessment & Plan Note (Signed)
Follow up with cardiology. Aware of need to monitor weight gain and fluid retention.

## 2023-10-01 NOTE — Assessment & Plan Note (Signed)
Under the care of cardiology. Encourage good medication compliance. BP in goal today. Check renal function.

## 2023-10-01 NOTE — Assessment & Plan Note (Signed)
Will need to further discuss at 4 wk follow up. Consider referral to [facility]. ?GLP-1

## 2023-10-01 NOTE — Assessment & Plan Note (Signed)
Hx of mild OSA and no CPAP. May need to re-evaluate

## 2023-10-09 ENCOUNTER — Other Ambulatory Visit: Payer: Self-pay

## 2023-10-10 ENCOUNTER — Other Ambulatory Visit: Payer: Self-pay

## 2023-10-19 ENCOUNTER — Other Ambulatory Visit: Payer: Self-pay

## 2023-11-09 ENCOUNTER — Ambulatory Visit: Payer: 59 | Admitting: Family Medicine

## 2023-11-13 ENCOUNTER — Other Ambulatory Visit: Payer: Self-pay | Admitting: Family Medicine

## 2023-11-13 DIAGNOSIS — I5022 Chronic systolic (congestive) heart failure: Secondary | ICD-10-CM

## 2023-11-13 NOTE — Telephone Encounter (Signed)
Medication Refill -  Most Recent Primary Care Visit:  Provider: Avanell Shackleton  Department: [facility]  Visit Type: NEW PATIENT  Date: 09/28/2023  Medication: dapagliflozin propanediol (FARXIGA) 5 MG TAB   Has the patient contacted their pharmacy? No (Agent: If no, request that the patient contact the pharmacy for the refill. If patient does not wish to contact the pharmacy document the reason why and proceed with request.) (Agent: If yes, when and what did the pharmacy advise?)  Is this the correct pharmacy for this prescription? Yes If no, delete pharmacy and type the correct one.  This is the patient's preferred pharmacy:  [facility] [address], [address] [place] [address] Phone: [phone] Fax: [phone]   Has the prescription been filled recently? Yes  Is the patient out of the medication? Yes  Has the patient been seen for an appointment in the last year OR does the patient have an upcoming appointment? Yes  Can we respond through MyChart? Yes  Agent: Please be advised that Rx refills may take up to 3 business days. We ask that you follow-up with your pharmacy.

## 2023-11-14 NOTE — Telephone Encounter (Signed)
Pt no longer under prescribers care  Requested Prescriptions  Refused Prescriptions Disp Refills   dapagliflozin propanediol (FARXIGA) 5 MG TABS tablet 90 tablet 1    Sig: Take 1 tablet (5 mg total) by mouth daily before breakfast.     There is no refill protocol information for this order

## 2023-11-16 ENCOUNTER — Other Ambulatory Visit: Payer: Self-pay

## 2023-11-19 ENCOUNTER — Other Ambulatory Visit: Payer: Self-pay

## 2023-11-29 ENCOUNTER — Other Ambulatory Visit: Payer: Self-pay

## 2023-11-29 ENCOUNTER — Encounter (HOSPITAL_BASED_OUTPATIENT_CLINIC_OR_DEPARTMENT_OTHER): Payer: Self-pay

## 2023-11-29 ENCOUNTER — Emergency Department (HOSPITAL_BASED_OUTPATIENT_CLINIC_OR_DEPARTMENT_OTHER)
Admission: EM | Admit: 2023-11-29 | Discharge: 2023-11-29 | Disposition: A | Discharge status: Home / Self Care | Payer: 59 | Attending: Emergency Medicine | Admitting: Emergency Medicine

## 2023-11-29 DIAGNOSIS — R0981 Nasal congestion: Secondary | ICD-10-CM | POA: Diagnosis not present

## 2023-11-29 DIAGNOSIS — M545 Low back pain, unspecified: Secondary | ICD-10-CM | POA: Diagnosis present

## 2023-11-29 DIAGNOSIS — Z1152 Encounter for screening for COVID-19: Secondary | ICD-10-CM | POA: Diagnosis not present

## 2023-11-29 DIAGNOSIS — S39012A Strain of muscle, fascia and tendon of lower back, initial encounter: Secondary | ICD-10-CM

## 2023-11-29 LAB — URINALYSIS, ROUTINE W REFLEX MICROSCOPIC
Bilirubin Urine: NEGATIVE
Glucose, UA: >=500 mg/dL — AB
Hgb urine dipstick: NEGATIVE
Ketones, ur: NEGATIVE mg/dL
Leukocytes,Ua: NEGATIVE
Nitrite: NEGATIVE
Protein, ur: NEGATIVE mg/dL
Specific Gravity, Urine: 1.01 (ref 1.005–1.030)
pH: 5 (ref 5.0–8.0)

## 2023-11-29 LAB — RESP PANEL BY RT-PCR (RSV, FLU A&B, COVID)  RVPGX2
Influenza A by PCR: NEGATIVE
Influenza B by PCR: NEGATIVE
Resp Syncytial Virus by PCR: NEGATIVE
SARS Coronavirus 2 by RT PCR: NEGATIVE

## 2023-11-29 LAB — URINALYSIS, MICROSCOPIC (REFLEX)

## 2023-11-29 MED ORDER — NAPROXEN 375 MG PO TABS: 375.0000 mg | ORAL_TABLET | Freq: Two times a day (BID) | ORAL | 0 refills | Status: AC

## 2023-11-29 MED ORDER — CYCLOBENZAPRINE HCL 10 MG PO TABS: 5.0000 mg | ORAL_TABLET | Freq: Two times a day (BID) | ORAL | 0 refills | Status: AC | PRN

## 2023-11-29 NOTE — Discharge Instructions (Signed)
SEEK IMMEDIATE MEDICAL ATTENTION IF: New numbness, tingling, weakness, or problem with the use of your arms or legs.  Severe back pain not relieved with medications.  Change in bowel or bladder control.  Increasing pain in any areas of the body (such as chest or abdominal pain).  Shortness of breath, dizziness or fainting.  Nausea (feeling sick to your stomach), vomiting, fever, or sweats.  

## 2023-11-29 NOTE — ED Provider Notes (Signed)
[facility] Provider Note   CSN: [id number] Arrival date & time: 11/29/23  1414     History  Chief Complaint  Patient presents with   Generalized Body Aches   Nasal Congestion   Back Pain    Steve Carrillo is a 42 y.o. male.  Who presents emergency department with chief complaint of back pain.  Patient reports that he has been having lower back pain that is worse with movement stretching changing position and bending.  He denies chills body aches or other viral symptoms.  This is completely opposite of the triage note.  He denies weakness, lower extremity edema, or sensory deficits.  Patient does a lot of heavy lifting at work.   Back Pain      Home Medications Prior to Admission medications   Medication Sig Start Date End Date Taking? Authorizing Provider  atorvastatin (LIPITOR) 40 MG tablet Take 1 tablet (40 mg total) by mouth daily. 07/02/23   Hoy Register, MD  carvedilol (COREG) 25 MG tablet Take 1.5 tablets (37.5 mg total) by mouth 2 (two) times daily. 07/02/23   Hoy Register, MD  dapagliflozin propanediol (FARXIGA) 5 MG TABS tablet Take 1 tablet (5 mg total) by mouth daily before breakfast. 07/02/23   Hoy Register, MD  furosemide (LASIX) 40 MG tablet Take 1 tablet (40 mg total) by mouth daily. 07/02/23   Hoy Register, MD  isosorbide-hydrALAZINE (BIDIL) 20-37.5 MG tablet Take 1 tablet by mouth 3 (three) times daily. 07/02/23   Hoy Register, MD  methocarbamol (ROBAXIN) 500 MG tablet Take 2 tablets (1,000 mg total) by mouth every 8 (eight) hours as needed for muscle spasms. 07/02/23   Hoy Register, MD  sacubitril-valsartan (ENTRESTO) 49-51 MG Take 1 tablet by mouth 2 (two) times daily. 08/10/23   Croitoru, Mihai, MD  spironolactone (ALDACTONE) 25 MG tablet Take 1 tablet (25 mg total) by mouth daily. 07/02/23   Hoy Register, MD      Allergies    Patient has no known allergies.    Review of Systems   Review of  Systems  Musculoskeletal:  Positive for back pain.    Physical Exam Updated Vital Signs BP 119/86   Pulse 79   Temp 98.9 F (37.2 C) (Oral)   Resp 18   Ht 6\' 1"  (1.854 m)   Wt (!) 149.7 kg   SpO2 97%   BMI 43.54 kg/m  Physical Exam Vitals and nursing note reviewed.  Constitutional:      General: He is not in acute distress.    Appearance: He is well-developed. He is not diaphoretic.  HENT:     Head: Normocephalic and atraumatic.  Eyes:     General: No scleral icterus.    Conjunctiva/sclera: Conjunctivae normal.  Cardiovascular:     Rate and Rhythm: Normal rate and regular rhythm.     Heart sounds: Normal heart sounds.  Pulmonary:     Effort: Pulmonary effort is normal. No respiratory distress.     Breath sounds: Normal breath sounds.  Abdominal:     Palpations: Abdomen is soft.     Tenderness: There is no abdominal tenderness.  Musculoskeletal:     Cervical back: Normal range of motion and neck supple.     Comments: Patient appears to be in mild to moderate pain, antalgic gait noted. Lumbosacral spine area reveals no local tenderness or mass. Painful and reduced LS ROM noted. Straight leg raise is negative  DTR's, motor strength and sensation  normal, including heel and toe gait.  Peripheral pulses are palpable.   Skin:    General: Skin is warm and dry.  Neurological:     Mental Status: He is alert.  Psychiatric:        Behavior: Behavior normal.     ED Results / Procedures / Treatments   Labs (all labs ordered are listed, but only abnormal results are displayed) Labs Reviewed  URINALYSIS, ROUTINE W REFLEX MICROSCOPIC - Abnormal; Notable for the following components:      Result Value   Glucose, UA >=500 (*)    All other components within normal limits  URINALYSIS, MICROSCOPIC (REFLEX) - Abnormal; Notable for the following components:   Bacteria, UA RARE (*)    All other components within normal limits  RESP PANEL BY RT-PCR (RSV, FLU A&B, COVID)  [id number]     EKG None  Radiology No results found.  Procedures Procedures    Medications Ordered in ED Medications - No data to display  ED Course/ Medical Decision Making/ A&P                                 Medical Decision Making Amount and/or Complexity of Data Reviewed Labs: ordered.    Patient with back pain.  No neurological deficits and normal neuro exam.  Patient can walk but states is painful.  No loss of bowel or bladder control.  No concern for cauda equina.  No fever, night sweats, weight loss, h/o cancer, IVDU.  RICE protocol and pain medicine indicated and discussed with patient.          Final Clinical Impression(s) / ED Diagnoses Final diagnoses:  None    Rx / DC Orders ED Discharge Orders     None         Arthor Captain, PA-C 11/29/23 1832    Rondel Baton, MD 11/30/23 1224

## 2023-11-29 NOTE — ED Triage Notes (Signed)
The patient is having chills, body aches and back pain.

## 2023-12-07 ENCOUNTER — Ambulatory Visit: Payer: 59 | Admitting: Family Medicine

## 2023-12-14 ENCOUNTER — Inpatient Hospital Stay: Payer: 59 | Admitting: Family Medicine

## 2023-12-20 ENCOUNTER — Other Ambulatory Visit: Payer: Self-pay

## 2023-12-28 ENCOUNTER — Ambulatory Visit: Payer: 59 | Admitting: Family Medicine

## 2023-12-28 ENCOUNTER — Encounter: Payer: Self-pay | Admitting: Family Medicine

## 2023-12-28 VITALS — BP 130/86 | HR 81 | Temp 97.6°F | Ht 73.0 in | Wt 337.0 lb

## 2023-12-28 DIAGNOSIS — I5022 Chronic systolic (congestive) heart failure: Secondary | ICD-10-CM

## 2023-12-28 DIAGNOSIS — I10 Essential (primary) hypertension: Secondary | ICD-10-CM

## 2023-12-28 DIAGNOSIS — E785 Hyperlipidemia, unspecified: Secondary | ICD-10-CM

## 2023-12-28 DIAGNOSIS — R7303 Prediabetes: Secondary | ICD-10-CM | POA: Diagnosis not present

## 2023-12-28 LAB — COMPREHENSIVE METABOLIC PANEL
ALT: 19 U/L (ref 0–53)
AST: 16 U/L (ref 0–37)
Albumin: 4.3 g/dL (ref 3.5–5.2)
Alkaline Phosphatase: 71 U/L (ref 39–117)
BUN: 18 mg/dL (ref 6–23)
CO2: 27 meq/L (ref 19–32)
Calcium: 9.2 mg/dL (ref 8.4–10.5)
Chloride: 106 meq/L (ref 96–112)
Creatinine, Ser: 1.68 mg/dL — ABNORMAL HIGH (ref 0.40–1.50)
GFR: 49.96 mL/min — ABNORMAL LOW (ref >60.00)
Glucose, Bld: 112 mg/dL — ABNORMAL HIGH (ref 70–99)
Potassium: 4 meq/L (ref 3.5–5.1)
Sodium: 140 meq/L (ref 135–145)
Total Bilirubin: 0.6 mg/dL (ref 0.2–1.2)
Total Protein: 7.2 g/dL (ref 6.0–8.3)

## 2023-12-28 LAB — CBC WITH DIFFERENTIAL/PLATELET
Basophils Absolute: 0.1 10*3/uL (ref 0.0–0.1)
Basophils Relative: 1.4 % (ref 0.0–3.0)
Eosinophils Absolute: 0.1 10*3/uL (ref 0.0–0.7)
Eosinophils Relative: 2.8 % (ref 0.0–5.0)
HCT: 46.6 % (ref 39.0–52.0)
Hemoglobin: 15.3 g/dL (ref 13.0–17.0)
Lymphocytes Relative: 33 % (ref 12.0–46.0)
Lymphs Abs: 1.4 10*3/uL (ref 0.7–4.0)
MCHC: 32.8 g/dL (ref 30.0–36.0)
MCV: 90.3 fL (ref 78.0–100.0)
Monocytes Absolute: 0.4 10*3/uL (ref 0.1–1.0)
Monocytes Relative: 9.9 % (ref 3.0–12.0)
Neutro Abs: 2.3 10*3/uL (ref 1.4–7.7)
Neutrophils Relative %: 52.9 % (ref 43.0–77.0)
Platelets: 231 10*3/uL (ref 150.0–400.0)
RBC: 5.16 Mil/uL (ref 4.22–5.81)
RDW: 14.4 % (ref 11.5–15.5)
WBC: 4.3 10*3/uL (ref 4.0–10.5)

## 2023-12-28 LAB — LIPID PANEL
Cholesterol: 142 mg/dL (ref 0–200)
HDL: 35.3 mg/dL — ABNORMAL LOW (ref >39.00)
LDL Cholesterol: 87 mg/dL (ref 0–99)
NonHDL: 107.13
Total CHOL/HDL Ratio: 4
Triglycerides: 101 mg/dL (ref 0.0–149.0)
VLDL: 20.2 mg/dL (ref 0.0–40.0)

## 2023-12-28 LAB — HEMOGLOBIN A1C: Hgb A1c MFr Bld: 6.3 % (ref 4.6–6.5)

## 2023-12-28 NOTE — Patient Instructions (Signed)
Please go downstairs for labs.   We will be in touch with your results and with recommendations.

## 2023-12-28 NOTE — Progress Notes (Unsigned)
Subjective:     Patient ID: Steve Carrillo, male    DOB: 1981/01/28, 43 y.o.   MRN: [id number]  Chief Complaint  Patient presents with   Medical Management of Chronic Issues    HPI   History of Present Illness         Here to follow up on chronic health conditions.   Prediabetes- A1c 6.3% 3 months ago. Family hx of DM in mother and MGM.   Reports taking medications daily.   Does not check BP at home.   Former tobacco user, quit in 2015.  Smokes marijuana occasionally   Going to [employer] loss clinic next week.  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 Vessels are very large due to hypertensive cardiomyopathy.    RIGHT/LEFT HEART CATH AND CORONARY ANGIOGRAPHY N/A 08/10/2020   Procedure: RIGHT/LEFT HEART CATH AND CORONARY ANGIOGRAPHY;  Surgeon: Swaziland, Peter M, MD;  Location: [facility] INVASIVE CV LAB;  Service: Cardiovascular;  Laterality: N/A;    Family History  Problem Relation Age of Onset   Diabetes Mellitus II Mother    Hypertension Mother    Hypertension Brother    Diabetes Mellitus II Maternal Grandmother     Social History   Socioeconomic History   Marital status: Married    Spouse name: Not on file   Number of children: Not on file   Years of education: Not on file   Highest education level:  12th grade  Occupational History   Occupation: Mining engineer: [employer]  Tobacco Use   Smoking status: Former    Current packs/day: 0.00    Types: Cigarettes    Start date: 09/14/1997    Quit date: 09/14/2014    Years since quitting: 9.3   Smokeless tobacco: Never  Vaping Use   Vaping status: Never Used  Substance and Sexual Activity   Alcohol use: Not Currently    Comment: socially   Drug use: Yes    Types: Marijuana    Comment: 07/10/21   Sexual activity: Yes  Other Topics Concern   Not on file  Social History Narrative   Lives with girlfriend.    Social Drivers of Corporate investment banker Strain: Low Risk  (12/24/2023)   Overall Financial Resource Strain (CARDIA)    Difficulty of Paying Living Expenses: Not hard at all  Food Insecurity: Food Insecurity Present (12/24/2023)   Hunger Vital Sign    Worried About Running Out of Food in the Last Year: Never true    Ran Out of Food in the Last Year: Sometimes true  Transportation Needs: No Transportation Needs (12/24/2023)   PRAPARE - Administrator, Civil Service (Medical): No    Lack of Transportation (Non-Medical):  No  Physical Activity: Unknown (12/24/2023)   Exercise Vital Sign    Days of Exercise per Week: 0 days    Minutes of Exercise per Session: Not on file  Stress: No Stress Concern Present (12/24/2023)   [employer] of Occupational Health - Occupational Stress Questionnaire    Feeling of Stress : Not at all  Social Connections: Moderately Integrated (12/24/2023)   Social Connection and Isolation Panel [NHANES]    Frequency of Communication with Friends and Family: More than three times a week    Frequency of Social Gatherings with Friends and Family: Patient declined    Attends Religious Services: 1 to 4 times per year    Active Member of [employer] or Organizations: No    Attends Engineer, structural: Not on file    Marital Status: Married  Intimate Partner Violence:  Unknown (08/20/2023)   Received from [facility]   HITS    Physically Hurt: Not on file    Insult or Talk Down To: Not on file    Threaten Physical Harm: Not on file    Scream or Curse: Not on file    Outpatient Medications Prior to Visit  Medication Sig Dispense Refill   atorvastatin (LIPITOR) 40 MG tablet Take 1 tablet (40 mg total) by mouth daily. 90 tablet 1   carvedilol (COREG) 25 MG tablet Take 1.5 tablets (37.5 mg total) by mouth 2 (two) times daily. 270 tablet 1   cyclobenzaprine (FLEXERIL) 10 MG tablet Take 0.5-1 tablets (5-10 mg total) by mouth 2 (two) times daily as needed for muscle spasms. 20 tablet 0   dapagliflozin propanediol (FARXIGA) 5 MG TABS tablet Take 1 tablet (5 mg total) by mouth daily before breakfast. 90 tablet 1   furosemide (LASIX) 40 MG tablet Take 1 tablet (40 mg total) by mouth daily. 90 tablet 1   isosorbide-hydrALAZINE (BIDIL) 20-37.5 MG tablet Take 1 tablet by mouth 3 (three) times daily. 270 tablet 1   methocarbamol (ROBAXIN) 500 MG tablet Take 2 tablets (1,000 mg total) by mouth every 8 (eight) hours as needed for muscle spasms. 90 tablet 0   naproxen (NAPROSYN) 375 MG tablet Take 1 tablet (375 mg total) by mouth 2 (two) times daily with a meal. 20 tablet 0   sacubitril-valsartan (ENTRESTO) 49-51 MG Take 1 tablet by mouth 2 (two) times daily. 180 tablet 3   spironolactone (ALDACTONE) 25 MG tablet Take 1 tablet (25 mg total) by mouth daily. 90 tablet 1   No facility-administered medications prior to visit.    No Known Allergies  Review of Systems  Constitutional:  Negative for chills, fever, malaise/fatigue and weight loss.  Eyes:  Negative for blurred vision and double vision.  Respiratory:  Negative for shortness of breath.   Cardiovascular:  Negative for chest pain, palpitations and leg swelling.  Gastrointestinal:  Negative for abdominal pain, constipation, diarrhea, nausea and vomiting.  Genitourinary:  Negative for dysuria, frequency and  urgency.  Neurological:  Negative for dizziness and focal weakness.  Endo/Heme/Allergies:  Negative for polydipsia.       Objective:    Physical Exam Constitutional:      General: He is not in acute distress.    Appearance: He is not ill-appearing.  Eyes:     Extraocular Movements: Extraocular movements intact.     Conjunctiva/sclera: Conjunctivae normal.  Cardiovascular:     Rate and Rhythm: Normal rate.  Pulmonary:     Effort: Pulmonary effort is normal.  Musculoskeletal:  Cervical back: Normal range of motion and neck supple.  Skin:    General: Skin is warm and dry.  Neurological:     General: No focal deficit present.     Mental Status: He is alert and oriented to person, place, and time.  Psychiatric:        Mood and Affect: Mood normal.        Behavior: Behavior normal.        Thought Content: Thought content normal.      BP 130/86 (BP Location: Left Arm, Patient Position: Sitting, Cuff Size: Large)   Pulse 81   Temp 97.6 F (36.4 C) (Temporal)   Ht 6\' 1"  (1.854 m)   Wt (!) 337 lb (152.9 kg)   SpO2 95%   BMI 44.46 kg/m  Wt Readings from Last 3 Encounters:  12/28/23 (!) 337 lb (152.9 kg)  11/29/23 (!) 330 lb (149.7 kg)  09/28/23 (!) 344 lb 3.2 oz (156.1 kg)       Assessment & Plan:   Problem List Items Addressed This Visit     Chronic systolic congestive heart failure (HCC)   Essential hypertension, malignant   Hyperlipidemia   Relevant Orders   Lipid panel (Completed)   Morbid obesity (HCC)   Relevant Orders   CBC with Differential/Platelet (Completed)   Comprehensive metabolic panel (Completed)   Hemoglobin A1c (Completed)   Lipid panel (Completed)   Prediabetes - Primary   Relevant Orders   CBC with Differential/Platelet (Completed)   Comprehensive metabolic panel (Completed)   Hemoglobin A1c (Completed)   Here for a fasting follow up on chronic health conditions.  He has an appt to see [facility].  Under the care of  cardiology for CHF and malignant HTN.  Check labs today and follow up.    I am having Aceyn J. Deist maintain his methocarbamol, atorvastatin, carvedilol, dapagliflozin propanediol, furosemide, isosorbide-hydrALAZINE, spironolactone, sacubitril-valsartan, naproxen, and cyclobenzaprine.  No orders of the defined types were placed in this encounter.

## 2023-12-31 ENCOUNTER — Encounter: Payer: Self-pay | Admitting: Family Medicine

## 2024-01-01 ENCOUNTER — Other Ambulatory Visit: Payer: Self-pay | Admitting: Family Medicine

## 2024-01-01 DIAGNOSIS — E876 Hypokalemia: Secondary | ICD-10-CM

## 2024-01-01 DIAGNOSIS — I10 Essential (primary) hypertension: Secondary | ICD-10-CM

## 2024-01-01 DIAGNOSIS — I5022 Chronic systolic (congestive) heart failure: Secondary | ICD-10-CM

## 2024-01-04 ENCOUNTER — Other Ambulatory Visit: Payer: Self-pay | Admitting: Family Medicine

## 2024-01-04 ENCOUNTER — Other Ambulatory Visit: Payer: Self-pay

## 2024-01-04 DIAGNOSIS — I10 Essential (primary) hypertension: Secondary | ICD-10-CM

## 2024-01-04 DIAGNOSIS — I5022 Chronic systolic (congestive) heart failure: Secondary | ICD-10-CM

## 2024-01-04 DIAGNOSIS — E876 Hypokalemia: Secondary | ICD-10-CM

## 2024-01-04 NOTE — Telephone Encounter (Signed)
1st fill w you, ok to refill?

## 2024-01-04 NOTE — Telephone Encounter (Signed)
Last Fill: Lasix: 07/02/23     Aldactone: 07/02/23  Last OV: 12/28/23 Next OV: None Scheduled  Routing to provider for review/authorization.

## 2024-01-04 NOTE — Telephone Encounter (Signed)
Copied from CRM [id number]. Topic: Clinical - Medication Refill >> Jan 04, 2024 12:33 PM Corin V wrote: Most Recent Primary Care Visit:  Provider: Avanell Shackleton  Department: [facility]  Visit Type: OFFICE VISIT  Date: 12/28/2023  Medication: furosemide (LASIX) 40 MG tablet spironolactone (ALDACTONE) 25 MG tablet  Has the patient contacted their pharmacy? Yes- no refills left (Agent: If no, request that the patient contact the pharmacy for the refill. If patient does not wish to contact the pharmacy document the reason why and proceed with request.) (Agent: If yes, when and what did the pharmacy advise?)  Is this the correct pharmacy for this prescription? Yes If no, delete pharmacy and type the correct one.  This is the patient's preferred pharmacy:  [facility] [address], [address] [place] [address] Phone: [phone] Fax: [phone]   Has the prescription been filled recently? No  Is the patient out of the medication? No  Has the patient been seen for an appointment in the last year OR does the patient have an upcoming appointment? Yes  Can we respond through MyChart? Yes  Agent: Please be advised that Rx refills may take up to 3 business days. We ask that you follow-up with your pharmacy.

## 2024-01-07 ENCOUNTER — Other Ambulatory Visit: Payer: Self-pay

## 2024-01-07 MED ORDER — SPIRONOLACTONE 25 MG PO TABS
25.0000 mg | ORAL_TABLET | Freq: Every day | ORAL | 1 refills | Status: DC
  Filled 2024-01-07: qty 90, 90d supply, fill #0
  Filled 2024-04-01: qty 90, 90d supply, fill #1

## 2024-01-07 MED ORDER — FUROSEMIDE 40 MG PO TABS
40.0000 mg | ORAL_TABLET | Freq: Every day | ORAL | 1 refills | Status: DC
  Filled 2024-01-07: qty 90, 90d supply, fill #0
  Filled 2024-04-01: qty 90, 90d supply, fill #1

## 2024-01-08 ENCOUNTER — Other Ambulatory Visit: Payer: Self-pay

## 2024-01-08 NOTE — Telephone Encounter (Signed)
Sent pt mychart message this was only temp refill.  We will no longer send this in for him and he needs to schedule w cardiology

## 2024-01-14 ENCOUNTER — Other Ambulatory Visit: Payer: Self-pay | Admitting: Family Medicine

## 2024-01-14 ENCOUNTER — Other Ambulatory Visit: Payer: Self-pay

## 2024-01-14 DIAGNOSIS — E785 Hyperlipidemia, unspecified: Secondary | ICD-10-CM

## 2024-01-14 DIAGNOSIS — I10 Essential (primary) hypertension: Secondary | ICD-10-CM

## 2024-01-14 DIAGNOSIS — I5022 Chronic systolic (congestive) heart failure: Secondary | ICD-10-CM

## 2024-01-16 ENCOUNTER — Other Ambulatory Visit: Payer: Self-pay

## 2024-01-18 ENCOUNTER — Other Ambulatory Visit: Payer: Self-pay

## 2024-01-21 ENCOUNTER — Other Ambulatory Visit: Payer: Self-pay

## 2024-01-23 ENCOUNTER — Other Ambulatory Visit: Payer: Self-pay

## 2024-01-25 ENCOUNTER — Other Ambulatory Visit: Payer: Self-pay

## 2024-01-29 ENCOUNTER — Other Ambulatory Visit: Payer: Self-pay

## 2024-02-11 ENCOUNTER — Other Ambulatory Visit: Payer: Self-pay | Admitting: Family Medicine

## 2024-02-11 DIAGNOSIS — I5022 Chronic systolic (congestive) heart failure: Secondary | ICD-10-CM

## 2024-02-11 DIAGNOSIS — I10 Essential (primary) hypertension: Secondary | ICD-10-CM

## 2024-02-15 ENCOUNTER — Other Ambulatory Visit: Payer: Self-pay

## 2024-02-15 ENCOUNTER — Other Ambulatory Visit: Payer: Self-pay | Admitting: Family Medicine

## 2024-02-15 ENCOUNTER — Encounter: Payer: Self-pay | Admitting: Physician Assistant

## 2024-02-15 ENCOUNTER — Ambulatory Visit: Payer: 59 | Attending: Physician Assistant | Admitting: Physician Assistant

## 2024-02-15 VITALS — BP 116/86 | HR 69 | Ht 73.0 in | Wt 327.0 lb

## 2024-02-15 DIAGNOSIS — N1831 Chronic kidney disease, stage 3a: Secondary | ICD-10-CM | POA: Diagnosis not present

## 2024-02-15 DIAGNOSIS — I10 Essential (primary) hypertension: Secondary | ICD-10-CM

## 2024-02-15 DIAGNOSIS — I429 Cardiomyopathy, unspecified: Secondary | ICD-10-CM | POA: Diagnosis not present

## 2024-02-15 DIAGNOSIS — E785 Hyperlipidemia, unspecified: Secondary | ICD-10-CM | POA: Diagnosis not present

## 2024-02-15 DIAGNOSIS — I5022 Chronic systolic (congestive) heart failure: Secondary | ICD-10-CM | POA: Diagnosis not present

## 2024-02-15 DIAGNOSIS — I509 Heart failure, unspecified: Secondary | ICD-10-CM

## 2024-02-15 MED ORDER — ATORVASTATIN CALCIUM 40 MG PO TABS
40.0000 mg | ORAL_TABLET | Freq: Every day | ORAL | 3 refills | Status: DC
  Filled 2024-02-15: qty 90, 90d supply, fill #0
  Filled 2024-05-13: qty 90, 90d supply, fill #1
  Filled 2024-08-08: qty 30, 30d supply, fill #1
  Filled 2024-09-09: qty 30, 30d supply, fill #2
  Filled 2024-10-07: qty 30, 30d supply, fill #3

## 2024-02-15 MED ORDER — CARVEDILOL 25 MG PO TABS
37.5000 mg | ORAL_TABLET | Freq: Two times a day (BID) | ORAL | 1 refills | Status: DC
Start: 2024-02-15 — End: 2024-06-24
  Filled 2024-02-15: qty 270, 90d supply, fill #0
  Filled 2024-04-18 – 2024-04-28 (×2): qty 270, 90d supply, fill #1

## 2024-02-15 MED ORDER — DAPAGLIFLOZIN PROPANEDIOL 5 MG PO TABS
5.0000 mg | ORAL_TABLET | Freq: Every day | ORAL | 3 refills | Status: DC
  Filled 2024-02-15: qty 90, 90d supply, fill #0
  Filled 2024-05-07 – 2024-05-13 (×2): qty 90, 90d supply, fill #1
  Filled 2024-06-04 – 2024-07-20 (×4): qty 30, 30d supply, fill #1
  Filled 2024-07-22: qty 60, 60d supply, fill #1
  Filled 2024-08-08 – 2024-09-04 (×5): qty 90, 90d supply, fill #1
  Filled 2024-09-09 – 2024-10-05 (×2): qty 30, 30d supply, fill #1
  Filled 2024-11-02: qty 30, 30d supply, fill #2

## 2024-02-15 NOTE — Progress Notes (Signed)
 Cardiology Office Note:  .   Date:  02/17/2024  ID:  [name], DOB Apr 27, 1981, MRN [id number] PCP: Avanell Shackleton, NP-C  [facility] Providers Cardiologist:  Thurmon Fair, MD     History of Present Illness: .   Steve Carrillo is a 43 y.o. male with PMH of hypertension, LVH, NICM and mild to moderate MR.  Echocardiogram in 2017 showed EF 35 to 40%, unchanged when compared to 2015.  Cardiac catheterization did not show any evidence of coronary artery disease in 2050 or 2021.  Sleep study showed a very mild obstructive sleep apnea and a CPAP was not recommended.  Repeat echocardiogram in March 2024 showed EF 30 to 35%.  According to Dr. Erin Hearing note from September 2024, with improved blood pressure control, there has been improvement in his LV systolic function.  Patient denies any recent chest pain or shortness of breath.  He has been compliant with his medication.  His blood pressure is very well-controlled.  He has no lower extremity edema, orthopnea or PND.  His lung is clear.  Overall, he is doing very well from the cardiac perspective.  Recent blood work obtained by PCP in January showed very well-controlled cholesterol.  He can follow-up with Dr. Royann Shivers in 6 months.  ROS:   He denies chest pain, palpitations, dyspnea, pnd, orthopnea, n, v, dizziness, syncope, edema, weight gain, or early satiety. All other systems reviewed and are otherwise negative except as noted above.    Studies Reviewed: Marland Kitchen   EKG Interpretation Date/Time:  Friday February 15 2024 10:59:43 EDT Ventricular Rate:  69 PR Interval:  202 QRS Duration:  106 QT Interval:  434 QTC Calculation: 465 R Axis:   -12  Text Interpretation: Normal sinus rhythm Nonspecific T wave abnormality Confirmed by Azalee Course [phone]) on 02/15/2024 11:25:07 AM    Cardiac Studies & Procedures   ______________________________________________________________________________________________ CARDIAC CATHETERIZATION  CARDIAC  CATHETERIZATION 08/10/2020  Narrative  LV end diastolic pressure is moderately elevated.  Hemodynamic findings consistent with mild pulmonary hypertension.  1. Very large coronary arteries- normal. 2. Moderately elevated LV filling pressures 3. Mild pulmonary HTN. 4. Reduced cardiac output. Index 2.23  Plan: optimize medical therapy for CHF  Findings Coronary Findings Diagnostic  Dominance: Right  Left Main Vessel was injected. Vessel is normal in caliber. Vessel is angiographically normal.  Left Anterior Descending Vessel was injected. Vessel is very large. Vessel is angiographically normal.  Ramus Intermedius Vessel was injected. Vessel is very large. Vessel is angiographically normal.  Left Circumflex Vessel was injected. Vessel is very large. Vessel is angiographically normal.  Right Coronary Artery Vessel was injected. Vessel is very large. Vessel is angiographically normal.  Intervention  No interventions have been documented.     ECHOCARDIOGRAM  ECHOCARDIOGRAM COMPLETE 02/27/2023  Narrative ECHOCARDIOGRAM REPORT    Patient Name:   Steve Carrillo Date of Exam: 02/27/2023 Medical Rec #:  [id number]      Height:       73.0 in Accession #:    [id number]     Weight:       337.6 lb Date of Birth:  25-Feb-1981     BSA:          2.688 m Patient Age:    41 years       BP:           151/96 mmHg Patient Gender: M              HR:  72 bpm. Exam Location:  [address]  Procedure: 2D Echo, 3D Echo, Cardiac Doppler, Color Doppler and Strain Analysis  Indications:    I50.43 Acute Combined Systolic and Diastolic Heart Failure  History:        Patient has prior history of Echocardiogram examinations, most recent 08/07/2020. Cardiomyopathy; Risk Factors:Hypertension, Dyslipidemia and Prediabetes.  Sonographer:    Daphine Deutscher RDCS Referring Phys: [id number] JESSE M CLEAVER  IMPRESSIONS   1. Left ventricular ejection fraction, by estimation, is 30  to 35%. The left ventricle has moderately decreased function. The left ventricle demonstrates global hypokinesis. The left ventricular internal cavity size was mildly to moderately dilated. There is moderate concentric left ventricular hypertrophy. Left ventricular diastolic parameters are consistent with Grade I diastolic dysfunction (impaired relaxation). 2. Right ventricular systolic function is normal. The right ventricular size is normal. 3. Left atrial size was moderately dilated. 4. The mitral valve is normal in structure. Trivial mitral valve regurgitation. No evidence of mitral stenosis. 5. The aortic valve is normal in structure. Aortic valve regurgitation is mild. No aortic stenosis is present. 6. Pulmonic valve regurgitation is moderate. 7. Aortic dilatation noted. There is moderate dilatation of the ascending aorta, measuring 45 mm. 8. The inferior vena cava is normal in size with greater than 50% respiratory variability, suggesting right atrial pressure of 3 mmHg.  FINDINGS Left Ventricle: Left ventricular ejection fraction, by estimation, is 30 to 35%. The left ventricle has moderately decreased function. The left ventricle demonstrates global hypokinesis. The left ventricular internal cavity size was mildly to moderately dilated. There is moderate concentric left ventricular hypertrophy. Left ventricular diastolic parameters are consistent with Grade I diastolic dysfunction (impaired relaxation).  Right Ventricle: The right ventricular size is normal. No increase in right ventricular wall thickness. Right ventricular systolic function is normal.  Left Atrium: Left atrial size was moderately dilated.  Right Atrium: Right atrial size was normal in size.  Pericardium: There is no evidence of pericardial effusion.  Mitral Valve: The mitral valve is normal in structure. Trivial mitral valve regurgitation. No evidence of mitral valve stenosis.  Tricuspid Valve: The tricuspid valve  is normal in structure. Tricuspid valve regurgitation is trivial. No evidence of tricuspid stenosis.  Aortic Valve: The aortic valve is normal in structure. Aortic valve regurgitation is mild. Aortic regurgitation PHT measures 384 msec. No aortic stenosis is present.  Pulmonic Valve: The pulmonic valve was normal in structure. Pulmonic valve regurgitation is moderate. No evidence of pulmonic stenosis.  Aorta: Aortic dilatation noted. There is moderate dilatation of the ascending aorta, measuring 45 mm.  Venous: The inferior vena cava is normal in size with greater than 50% respiratory variability, suggesting right atrial pressure of 3 mmHg.  IAS/Shunts: No atrial level shunt detected by color flow Doppler.   LEFT VENTRICLE PLAX 2D LVIDd:         6.80 cm   Diastology LVIDs:         5.30 cm   LV e' medial:    6.26 cm/s LV PW:         1.80 cm   LV E/e' medial:  7.9 LV IVS:        1.40 cm   LV e' lateral:   6.04 cm/s LVOT diam:     2.30 cm   LV E/e' lateral: 8.2 LV SV:         78 LV SV Index:   29        2D Longitudinal Strain LVOT Area:  4.15 cm  2D Strain GLS (A2C):   -13.7 % 2D Strain GLS (A3C):   -15.3 % 2D Strain GLS (A4C):   -10.4 % 2D Strain GLS Avg:     -13.1 %  3D Volume EF: 3D EF:        45 % LV EDV:       400 ml LV ESV:       220 ml LV SV:        180 ml  RIGHT VENTRICLE             IVC RV Basal diam:  4.40 cm     IVC diam: 1.00 cm RV S prime:     13.17 cm/s TAPSE (M-mode): 2.1 cm  LEFT ATRIUM              Index        RIGHT ATRIUM           Index LA diam:        4.70 cm  1.75 cm/m   RA Area:     19.20 cm LA Vol (A2C):   108.0 ml 40.18 ml/m  RA Volume:   59.30 ml  22.06 ml/m LA Vol (A4C):   108.0 ml 40.18 ml/m LA Biplane Vol: 109.0 ml 40.55 ml/m AORTIC VALVE LVOT Vmax:   93.13 cm/s LVOT Vmean:  66.867 cm/s LVOT VTI:    0.187 m AI PHT:      384 msec  AORTA Ao Root diam: 4.50 cm Ao Asc diam:  4.50 cm  MITRAL VALVE MV Area (PHT): 2.69 cm     SHUNTS MV Decel Time: 282 msec    Systemic VTI:  0.19 m MV E velocity: 49.30 cm/s  Systemic Diam: 2.30 cm MV A velocity: 66.00 cm/s MV E/A ratio:  0.75  Arvilla Meres MD Electronically signed by Arvilla Meres MD Signature Date/Time: 02/28/2023/12:55:49 PM    Final          ______________________________________________________________________________________________      Risk Assessment/Calculations:            Physical Exam:   VS:  BP 116/86 (BP Location: Left Arm, Patient Position: Sitting, Cuff Size: Large)   Pulse 69   Ht 6\' 1"  (1.854 m)   Wt (!) 327 lb (148.3 kg)   SpO2 93%   BMI 43.14 kg/m    Wt Readings from Last 3 Encounters:  02/15/24 (!) 327 lb (148.3 kg)  12/28/23 (!) 337 lb (152.9 kg)  11/29/23 (!) 330 lb (149.7 kg)    GEN: Well nourished, well developed in no acute distress NECK: No JVD; No carotid bruits CARDIAC: RRR, no murmurs, rubs, gallops RESPIRATORY:  Clear to auscultation without rales, wheezing or rhonchi  ABDOMEN: Soft, non-tender, non-distended EXTREMITIES:  No edema; No deformity   ASSESSMENT AND PLAN: .    Heart Failure with Reduced Ejection Fraction (HFrEF) Chronic HFrEF with improved ejection fraction to 35% due to medication. No angina, dyspnea, or edema. Compliant with medications. Blood pressure controlled. - Continue Lipitor, Carvedilol, Farxiga, Lasix, Bidil, and Entresto.  Hypertension Blood pressure well controlled with current regimen.  Hyperlipidemia Cholesterol levels well controlled with current regimen. - Refill atorvastatin.  Chronic Kidney Disease (CKD) CKD with stable creatinine at 1.68.        Dispo: Follow-up in 6 months  Signed, Azalee Course, [place]

## 2024-02-15 NOTE — Patient Instructions (Signed)
 Medication Instructions:  NO CHANGES *If you need a refill on your cardiac medications before your next appointment, please call your pharmacy*   Lab Work: NO LABS If you have labs (blood work) drawn today and your tests are completely normal, you will receive your results only by: MyChart Message (if you have MyChart) OR A paper copy in the mail If you have any lab test that is abnormal or we need to change your treatment, we will call you to review the results.   Testing/Procedures: NO TESTING   Follow-Up: At [facility], you and your health needs are our priority.  As part of our continuing mission to provide you with exceptional heart care, we have created designated Provider Care Teams.  These Care Teams include your primary Cardiologist (physician) and Advanced Practice Providers (APPs -  Physician Assistants and Nurse Practitioners) who all work together to provide you with the care you need, when you need it.  Your next appointment:   6 month(s)  Provider:   Thurmon Fair, MD   Other Instructions

## 2024-02-18 ENCOUNTER — Other Ambulatory Visit: Payer: Self-pay

## 2024-03-12 LAB — LAB REPORT - SCANNED
Albumin, Urine POC: 431.5
Albumin/Creatinine Ratio, Urine, POC: 191
Creatinine, POC: 225.8 mg/dL
EGFR: 50

## 2024-04-01 ENCOUNTER — Other Ambulatory Visit: Payer: Self-pay

## 2024-04-07 ENCOUNTER — Other Ambulatory Visit: Payer: Self-pay

## 2024-04-16 ENCOUNTER — Telehealth: Payer: Self-pay | Admitting: Family Medicine

## 2024-04-16 NOTE — Telephone Encounter (Signed)
 Both medications are managed by cardiology, patient needs to contact prescribing provider. Closing encounter.

## 2024-04-16 NOTE — Telephone Encounter (Signed)
 Copied from CRM [id number]. Topic: Clinical - Medication Refill >> Apr 16, 2024  1:09 PM Marissa P wrote: Medication:  carvedilol  (COREG ) 25 MG tablet And sacubitril -valsartan  (ENTRESTO ) 49-51 MG   Has the patient contacted their pharmacy? No (Agent: If no, request that the patient contact the pharmacy for the refill. If patient does not wish to contact the pharmacy document the reason why and proceed with request.) (Agent: If yes, when and what did the pharmacy advise?)  This is the patient's preferred pharmacy:  [facility] [address], [address] [place] [address] Phone: [phone] Fax: [phone]  Is this the correct pharmacy for this prescription? Yes If no, delete pharmacy and type the correct one.   Has the prescription been filled recently? Yes  Is the patient out of the medication? Yes  Has the patient been seen for an appointment in the last year OR does the patient have an upcoming appointment? No  Can we respond through MyChart? Yes  Agent: Please be advised that Rx refills may take up to 3 business days. We ask that you follow-up with your pharmacy.

## 2024-04-18 ENCOUNTER — Other Ambulatory Visit: Payer: Self-pay

## 2024-04-21 ENCOUNTER — Other Ambulatory Visit: Payer: Self-pay

## 2024-04-30 ENCOUNTER — Other Ambulatory Visit: Payer: Self-pay

## 2024-05-07 ENCOUNTER — Other Ambulatory Visit: Payer: Self-pay

## 2024-05-13 ENCOUNTER — Other Ambulatory Visit: Payer: Self-pay

## 2024-05-16 ENCOUNTER — Other Ambulatory Visit: Payer: Self-pay

## 2024-05-17 ENCOUNTER — Other Ambulatory Visit: Payer: Self-pay

## 2024-05-21 ENCOUNTER — Other Ambulatory Visit: Payer: Self-pay

## 2024-05-22 ENCOUNTER — Other Ambulatory Visit: Payer: Self-pay

## 2024-05-23 ENCOUNTER — Other Ambulatory Visit: Payer: Self-pay

## 2024-05-26 ENCOUNTER — Other Ambulatory Visit: Payer: Self-pay

## 2024-05-27 ENCOUNTER — Other Ambulatory Visit: Payer: Self-pay

## 2024-06-04 ENCOUNTER — Other Ambulatory Visit: Payer: Self-pay

## 2024-06-05 ENCOUNTER — Other Ambulatory Visit: Payer: Self-pay

## 2024-06-09 ENCOUNTER — Other Ambulatory Visit: Payer: Self-pay

## 2024-06-11 ENCOUNTER — Other Ambulatory Visit: Payer: Self-pay

## 2024-06-12 ENCOUNTER — Other Ambulatory Visit: Payer: Self-pay

## 2024-06-17 ENCOUNTER — Other Ambulatory Visit: Payer: Self-pay

## 2024-06-17 ENCOUNTER — Telehealth: Payer: Self-pay | Admitting: Pharmacy Technician

## 2024-06-17 NOTE — Telephone Encounter (Signed)
   Patient would not benefit from metformin since using farxiga  for heart failure per note: Chronic HFrEF with improved ejection fraction to 35% due to medication. No angina, dyspnea, or edema. Compliant with medications. Blood pressure controlled. - Continue Lipitor, Carvedilol , Farxiga , Lasix , Bidil , and Entresto .

## 2024-06-19 ENCOUNTER — Other Ambulatory Visit: Payer: Self-pay

## 2024-06-23 ENCOUNTER — Other Ambulatory Visit: Payer: Self-pay

## 2024-06-24 ENCOUNTER — Other Ambulatory Visit: Payer: Self-pay

## 2024-06-24 MED ORDER — SPIRONOLACTONE 50 MG PO TABS
50.0000 mg | ORAL_TABLET | Freq: Every day | ORAL | 0 refills | Status: DC
  Filled 2024-06-24: qty 30, 30d supply, fill #0
  Filled 2024-08-08: qty 30, 30d supply, fill #1
  Filled 2024-09-09: qty 30, 30d supply, fill #2

## 2024-06-24 MED ORDER — ATORVASTATIN CALCIUM 40 MG PO TABS
40.0000 mg | ORAL_TABLET | Freq: Every day | ORAL | 0 refills | Status: DC
  Filled 2024-06-24: qty 30, 30d supply, fill #0

## 2024-06-24 MED ORDER — FUROSEMIDE 40 MG PO TABS
40.0000 mg | ORAL_TABLET | Freq: Every day | ORAL | 0 refills | Status: DC
  Filled 2024-06-24: qty 30, 30d supply, fill #0
  Filled 2024-08-08: qty 30, 30d supply, fill #1
  Filled 2024-08-31: qty 30, 30d supply, fill #2

## 2024-06-24 MED ORDER — VITAMIN D (ERGOCALCIFEROL) 1.25 MG (50000 UNIT) PO CAPS
50000.0000 [IU] | ORAL_CAPSULE | ORAL | 5 refills | Status: AC
  Filled 2024-06-24: qty 4, 28d supply, fill #0
  Filled 2024-07-20: qty 4, 28d supply, fill #1
  Filled 2024-08-20: qty 4, 28d supply, fill #2
  Filled 2024-10-07: qty 4, 28d supply, fill #3
  Filled 2024-11-23 – 2024-12-08 (×2): qty 4, 28d supply, fill #4

## 2024-06-24 MED ORDER — CARVEDILOL 25 MG PO TABS
25.0000 mg | ORAL_TABLET | Freq: Every day | ORAL | 0 refills | Status: DC
  Filled 2024-06-29: qty 30, 30d supply, fill #0
  Filled 2024-08-02: qty 30, 30d supply, fill #1
  Filled 2024-08-31: qty 30, 30d supply, fill #2

## 2024-06-24 MED ORDER — SACUBITRIL-VALSARTAN 49-51 MG PO TABS
1.0000 | ORAL_TABLET | Freq: Every day | ORAL | 0 refills | Status: DC
  Filled 2024-07-20: qty 30, 30d supply, fill #0
  Filled 2024-08-20: qty 30, 30d supply, fill #1
  Filled 2024-08-31 – 2024-09-12 (×4): qty 30, 30d supply, fill #2

## 2024-06-26 ENCOUNTER — Other Ambulatory Visit: Payer: Self-pay

## 2024-06-27 ENCOUNTER — Other Ambulatory Visit: Payer: Self-pay

## 2024-06-28 ENCOUNTER — Other Ambulatory Visit: Payer: Self-pay

## 2024-06-29 ENCOUNTER — Other Ambulatory Visit: Payer: Self-pay | Admitting: Family Medicine

## 2024-06-29 DIAGNOSIS — I10 Essential (primary) hypertension: Secondary | ICD-10-CM

## 2024-06-29 DIAGNOSIS — I5022 Chronic systolic (congestive) heart failure: Secondary | ICD-10-CM

## 2024-06-29 DIAGNOSIS — T502X5A Adverse effect of carbonic-anhydrase inhibitors, benzothiadiazides and other diuretics, initial encounter: Secondary | ICD-10-CM

## 2024-06-30 ENCOUNTER — Other Ambulatory Visit: Payer: Self-pay

## 2024-06-30 MED ORDER — SPIRONOLACTONE 25 MG PO TABS
25.0000 mg | ORAL_TABLET | Freq: Every day | ORAL | 0 refills | Status: DC
  Filled 2024-06-30: qty 30, 30d supply, fill #0

## 2024-07-04 ENCOUNTER — Other Ambulatory Visit: Payer: Self-pay

## 2024-07-07 ENCOUNTER — Other Ambulatory Visit: Payer: Self-pay

## 2024-07-08 ENCOUNTER — Other Ambulatory Visit: Payer: Self-pay

## 2024-07-14 ENCOUNTER — Other Ambulatory Visit: Payer: Self-pay

## 2024-07-21 ENCOUNTER — Other Ambulatory Visit: Payer: Self-pay

## 2024-07-22 ENCOUNTER — Other Ambulatory Visit: Payer: Self-pay

## 2024-07-23 ENCOUNTER — Other Ambulatory Visit (HOSPITAL_BASED_OUTPATIENT_CLINIC_OR_DEPARTMENT_OTHER): Payer: Self-pay

## 2024-07-23 MED ORDER — DAPAGLIFLOZIN PROPANEDIOL 5 MG PO TABS
5.0000 mg | ORAL_TABLET | Freq: Every day | ORAL | 3 refills | Status: DC
  Filled 2024-07-23 – 2024-08-08 (×4): qty 30, 30d supply, fill #0
  Filled 2024-08-31 – 2024-09-09 (×2): qty 30, 30d supply, fill #1
  Filled 2024-10-07 – 2024-11-02 (×2): qty 30, 30d supply, fill #2

## 2024-07-24 ENCOUNTER — Other Ambulatory Visit: Payer: Self-pay

## 2024-07-24 ENCOUNTER — Other Ambulatory Visit (HOSPITAL_BASED_OUTPATIENT_CLINIC_OR_DEPARTMENT_OTHER): Payer: Self-pay

## 2024-07-25 ENCOUNTER — Other Ambulatory Visit: Payer: Self-pay

## 2024-07-31 ENCOUNTER — Other Ambulatory Visit: Payer: Self-pay | Admitting: Family Medicine

## 2024-07-31 ENCOUNTER — Other Ambulatory Visit: Payer: Self-pay

## 2024-07-31 DIAGNOSIS — E876 Hypokalemia: Secondary | ICD-10-CM

## 2024-07-31 DIAGNOSIS — I10 Essential (primary) hypertension: Secondary | ICD-10-CM

## 2024-07-31 DIAGNOSIS — I5022 Chronic systolic (congestive) heart failure: Secondary | ICD-10-CM

## 2024-08-03 ENCOUNTER — Other Ambulatory Visit: Payer: Self-pay

## 2024-08-08 ENCOUNTER — Encounter (HOSPITAL_BASED_OUTPATIENT_CLINIC_OR_DEPARTMENT_OTHER): Payer: Self-pay | Admitting: Urology

## 2024-08-08 ENCOUNTER — Emergency Department (HOSPITAL_BASED_OUTPATIENT_CLINIC_OR_DEPARTMENT_OTHER)
Admission: EM | Admit: 2024-08-08 | Discharge: 2024-08-08 | Disposition: A | Discharge status: Home / Self Care | Attending: Emergency Medicine | Admitting: Emergency Medicine

## 2024-08-08 ENCOUNTER — Other Ambulatory Visit: Payer: Self-pay

## 2024-08-08 DIAGNOSIS — N182 Chronic kidney disease, stage 2 (mild): Secondary | ICD-10-CM | POA: Diagnosis not present

## 2024-08-08 DIAGNOSIS — I5040 Unspecified combined systolic (congestive) and diastolic (congestive) heart failure: Secondary | ICD-10-CM | POA: Diagnosis not present

## 2024-08-08 DIAGNOSIS — I130 Hypertensive heart and chronic kidney disease with heart failure and stage 1 through stage 4 chronic kidney disease, or unspecified chronic kidney disease: Secondary | ICD-10-CM | POA: Insufficient documentation

## 2024-08-08 DIAGNOSIS — Z79899 Other long term (current) drug therapy: Secondary | ICD-10-CM | POA: Diagnosis not present

## 2024-08-08 DIAGNOSIS — U071 COVID-19: Secondary | ICD-10-CM | POA: Diagnosis not present

## 2024-08-08 DIAGNOSIS — M791 Myalgia, unspecified site: Secondary | ICD-10-CM | POA: Diagnosis present

## 2024-08-08 LAB — RESP PANEL BY RT-PCR (RSV, FLU A&B, COVID)  RVPGX2
Influenza A by PCR: NEGATIVE
Influenza B by PCR: NEGATIVE
Resp Syncytial Virus by PCR: NEGATIVE
SARS Coronavirus 2 by RT PCR: POSITIVE — AB

## 2024-08-08 MED ORDER — ACETAMINOPHEN 325 MG PO TABS
650.0000 mg | ORAL_TABLET | Freq: Once | ORAL | Status: AC | PRN
  Administered 2024-08-08: 650 mg via ORAL
  Filled 2024-08-08: qty 2

## 2024-08-08 NOTE — Discharge Instructions (Signed)
 Encourage you to continue Tylenol  every 6 hours to manage your fever, and stressed the importance of fluid intake over the next several days.  Should your shortness of breath or chest pain begin to worsen, please follow back up.  To the emergency department.  However your lung sounds are clear, and there is no concerns at this time for a developing pneumonia.  Can also continue to use any over-the-counter cough and cold remedies as needed, just be careful that they do not contain excess Tylenol /acetaminophen  as this can lead to a Tylenol  overdose.

## 2024-08-08 NOTE — ED Triage Notes (Signed)
 Pt states fatigue, headache, cough, congestion, runny nose that started yesterday  Unknown fever

## 2024-08-08 NOTE — ED Provider Notes (Signed)
 Steve Carrillo   CSN: [id number] Arrival date & time: 08/08/24  1515     Patient presents with: Flu like symptoms    Steve Carrillo is a 43 y.o. male whose primary concern presenting to the ED today with a bodyaches, fatigue, and sweating that began yesterday, unknown if he had a fever prior to his arrival to the ED today however he did have febrile like symptoms.  Further endorses headache, cough, nasal congestion as well as rhinorrhea.  He has a previous medical history of of prolonged QT syndrome, combined systolic and diastolic heart failure, primary hypertension, stage II CKD.   HPI     Prior to Admission medications   Medication Sig Start Date End Date Taking? Authorizing Provider  atorvastatin  (LIPITOR) 40 MG tablet Take 1 tablet (40 mg total) by mouth daily. 02/15/24   Meng, Hao, Steve Carrillo  atorvastatin  (LIPITOR) 40 MG tablet Take 1 tablet (40 mg total) by mouth daily. 06/23/24     carvedilol  (COREG ) 25 MG tablet Take 1 tablet (25 mg total) by mouth daily. 06/23/24     cyclobenzaprine  (FLEXERIL ) 10 MG tablet Take 0.5-1 tablets (5-10 mg total) by mouth 2 (two) times daily as needed for muscle spasms. 11/29/23   Harris, Abigail, Steve Carrillo-C  dapagliflozin  propanediol (FARXIGA ) 5 MG TABS tablet Take 1 tablet (5 mg total) by mouth daily before breakfast. 02/15/24   Meng, Hao, Steve Carrillo  dapagliflozin  propanediol (FARXIGA ) 5 MG TABS tablet Take 1 tablet (5 mg total) by mouth daily. 07/23/24     furosemide  (LASIX ) 40 MG tablet Take 1 tablet (40 mg total) by mouth daily. 06/23/24     isosorbide -hydrALAZINE  (BIDIL ) 20-37.5 MG tablet Take 1 tablet by mouth 3 (three) times daily. 07/02/23   Newlin, Enobong, Steve Carrillo  methocarbamol  (ROBAXIN ) 500 MG tablet Take 2 tablets (1,000 mg total) by mouth every 8 (eight) hours as needed for muscle spasms. 07/02/23   Newlin, Enobong, Steve Carrillo  naproxen  (NAPROSYN ) 375 MG tablet Take 1 tablet (375 mg total) by mouth 2 (two) times daily  with a meal. 11/29/23   Harris, Abigail, Steve Carrillo-C  sacubitril -valsartan  (ENTRESTO ) 49-51 MG Take 1 tablet by mouth daily. 06/23/24     spironolactone  (ALDACTONE ) 25 MG tablet Take 1 tablet (25 mg total) by mouth daily. Needs appointment for further refills. 06/30/24   Henson, Vickie L, NP-C  spironolactone  (ALDACTONE ) 50 MG tablet Take 1 tablet (50 mg total) by mouth daily. 06/23/24     Vitamin D , Ergocalciferol , (DRISDOL ) 1.25 MG (50000 UNIT) CAPS capsule Take 1 capsule (50,000 Units total) by mouth once a week. 06/23/24       Allergies: Patient has no known allergies.    Review of Systems  Constitutional:  Positive for activity change, appetite change, diaphoresis and fatigue.  HENT:  Positive for sore throat.   Musculoskeletal:  Positive for arthralgias.  Neurological:  Positive for headaches.    Updated Vital Signs BP (!) 150/128   Pulse (!) 109   Temp (!) 101.4 F (38.6 C) (Oral)   Resp 18   Ht 6' 1 (1.854 m)   Wt (!) 148.3 kg   SpO2 93%   BMI 43.13 kg/m   Physical Exam Vitals and nursing Carrillo reviewed.  Constitutional:      General: He is not in acute distress.    Appearance: Normal appearance.  HENT:     Head: Normocephalic and atraumatic.     Right Ear: Tympanic membrane, ear canal and  external ear normal.     Left Ear: Tympanic membrane, ear canal and external ear normal.     Nose: Nose normal.     Mouth/Throat:     Mouth: Mucous membranes are moist.     Pharynx: Oropharynx is clear.  Eyes:     Extraocular Movements: Extraocular movements intact.     Conjunctiva/sclera: Conjunctivae normal.     Pupils: Pupils are equal, round, and reactive to light.  Cardiovascular:     Rate and Rhythm: Normal rate and regular rhythm.     Pulses: Normal pulses.     Heart sounds: Normal heart sounds. No murmur heard.    No friction rub. No gallop.  Pulmonary:     Effort: Pulmonary effort is normal.     Breath sounds: Normal breath sounds and air entry.  Abdominal:     General:  Abdomen is flat. Bowel sounds are normal.     Palpations: Abdomen is soft.  Musculoskeletal:        General: Normal range of motion.     Cervical back: Normal range of motion and neck supple.     Right lower leg: No edema.     Left lower leg: No edema.  Skin:    General: Skin is warm and dry.     Capillary Refill: Capillary refill takes less than 2 seconds.  Neurological:     General: No focal deficit present.     Mental Status: He is alert. Mental status is at baseline.  Psychiatric:        Mood and Affect: Mood normal.     (all labs ordered are listed, but only abnormal results are displayed) Labs Reviewed  RESP PANEL BY RT-PCR (RSV, FLU A&B, COVID)  RVPGX2 - Abnormal; Notable for the following components:      Result Value   SARS Coronavirus 2 by RT PCR POSITIVE (*)    All other components within normal limits    EKG: None  Radiology: No results found.   Procedures   Medications Ordered in the ED  acetaminophen  (TYLENOL ) tablet 650 mg (650 mg Oral Given 08/08/24 1527)                                    Medical Decision Making Risk OTC drugs.   Given this patient's presenting complaint and history of present illness, obtained respiratory panel swab to assess for flu/COVID/RSV.  This did show positive for SARS-CoV-2, and as such believe symptoms are secondary to COVID infection.  Discussed with patient treatment options including Paxlovid, after shared decision making with the patient at this time he declines Paxlovid and will continue to manage his symptoms with symptomatic management, use over-the-counter cough and cold remedies as needed, stressed need for continued use of Tylenol  and/or ibuprofen  for fever management and increased oral fluid intake to prevent dehydration.  He understands and agrees with this, has no further concerns at this time.  As his vital signs are stable, and he has no concerning signs or symptoms, of lung sounds are clear and equal bilaterally  with no signs of respiratory distress, I find he is stable for discharge.  There is an increased blood pressure, he has a history of essential hypertension, discussed his need to follow-up with primary care regarding this.     Final diagnoses:  COVID    ED Discharge Orders     None  Steve Dorn BROCKS, Steve Carrillo 08/08/24 1815    Steve Fonda MATSU, Steve Carrillo 08/11/24 [phone]

## 2024-08-12 ENCOUNTER — Other Ambulatory Visit: Payer: Self-pay

## 2024-08-13 ENCOUNTER — Other Ambulatory Visit: Payer: Self-pay

## 2024-08-13 ENCOUNTER — Encounter: Payer: Self-pay | Admitting: Cardiovascular Disease

## 2024-08-20 ENCOUNTER — Other Ambulatory Visit: Payer: Self-pay

## 2024-08-20 ENCOUNTER — Other Ambulatory Visit: Payer: Self-pay | Admitting: Family Medicine

## 2024-08-20 DIAGNOSIS — I5022 Chronic systolic (congestive) heart failure: Secondary | ICD-10-CM

## 2024-08-20 DIAGNOSIS — I10 Essential (primary) hypertension: Secondary | ICD-10-CM

## 2024-08-20 DIAGNOSIS — E876 Hypokalemia: Secondary | ICD-10-CM

## 2024-08-21 ENCOUNTER — Other Ambulatory Visit: Payer: Self-pay

## 2024-08-21 MED ORDER — SPIRONOLACTONE 25 MG PO TABS
25.0000 mg | ORAL_TABLET | Freq: Every day | ORAL | 0 refills | Status: AC
  Filled 2024-08-21: qty 30, 30d supply, fill #0

## 2024-08-22 ENCOUNTER — Other Ambulatory Visit: Payer: Self-pay

## 2024-08-28 ENCOUNTER — Other Ambulatory Visit: Payer: Self-pay

## 2024-08-28 ENCOUNTER — Other Ambulatory Visit: Payer: Self-pay | Admitting: Family Medicine

## 2024-08-28 DIAGNOSIS — I10 Essential (primary) hypertension: Secondary | ICD-10-CM

## 2024-08-28 DIAGNOSIS — I5022 Chronic systolic (congestive) heart failure: Secondary | ICD-10-CM

## 2024-08-31 ENCOUNTER — Other Ambulatory Visit: Payer: Self-pay | Admitting: Family Medicine

## 2024-08-31 DIAGNOSIS — I10 Essential (primary) hypertension: Secondary | ICD-10-CM

## 2024-08-31 DIAGNOSIS — E876 Hypokalemia: Secondary | ICD-10-CM

## 2024-08-31 DIAGNOSIS — I5022 Chronic systolic (congestive) heart failure: Secondary | ICD-10-CM

## 2024-09-01 ENCOUNTER — Other Ambulatory Visit: Payer: Self-pay

## 2024-09-02 ENCOUNTER — Other Ambulatory Visit: Payer: Self-pay | Admitting: Family Medicine

## 2024-09-02 ENCOUNTER — Other Ambulatory Visit: Payer: Self-pay

## 2024-09-02 DIAGNOSIS — I10 Essential (primary) hypertension: Secondary | ICD-10-CM

## 2024-09-02 DIAGNOSIS — I5022 Chronic systolic (congestive) heart failure: Secondary | ICD-10-CM

## 2024-09-03 ENCOUNTER — Other Ambulatory Visit: Payer: Self-pay

## 2024-09-04 ENCOUNTER — Other Ambulatory Visit: Payer: Self-pay | Admitting: Family Medicine

## 2024-09-04 ENCOUNTER — Other Ambulatory Visit: Payer: Self-pay

## 2024-09-04 DIAGNOSIS — I10 Essential (primary) hypertension: Secondary | ICD-10-CM

## 2024-09-04 DIAGNOSIS — I5022 Chronic systolic (congestive) heart failure: Secondary | ICD-10-CM

## 2024-09-05 ENCOUNTER — Other Ambulatory Visit: Payer: Self-pay

## 2024-09-05 ENCOUNTER — Other Ambulatory Visit: Payer: Self-pay | Admitting: Family Medicine

## 2024-09-05 DIAGNOSIS — I5022 Chronic systolic (congestive) heart failure: Secondary | ICD-10-CM

## 2024-09-05 DIAGNOSIS — I10 Essential (primary) hypertension: Secondary | ICD-10-CM

## 2024-09-09 ENCOUNTER — Other Ambulatory Visit: Payer: Self-pay

## 2024-09-11 ENCOUNTER — Other Ambulatory Visit: Payer: Self-pay | Admitting: Family Medicine

## 2024-09-11 DIAGNOSIS — I10 Essential (primary) hypertension: Secondary | ICD-10-CM

## 2024-09-11 DIAGNOSIS — I5022 Chronic systolic (congestive) heart failure: Secondary | ICD-10-CM

## 2024-09-12 ENCOUNTER — Other Ambulatory Visit: Payer: Self-pay

## 2024-09-17 ENCOUNTER — Other Ambulatory Visit: Payer: Self-pay

## 2024-09-17 MED ORDER — SPIRONOLACTONE 50 MG PO TABS
50.0000 mg | ORAL_TABLET | Freq: Every day | ORAL | 0 refills | Status: AC
  Filled 2024-10-07: qty 30, 30d supply, fill #0
  Filled 2024-11-23 – 2024-12-08 (×2): qty 30, 30d supply, fill #1

## 2024-09-30 ENCOUNTER — Other Ambulatory Visit: Payer: Self-pay | Admitting: Family Medicine

## 2024-09-30 DIAGNOSIS — I10 Essential (primary) hypertension: Secondary | ICD-10-CM

## 2024-09-30 DIAGNOSIS — I5022 Chronic systolic (congestive) heart failure: Secondary | ICD-10-CM

## 2024-10-03 ENCOUNTER — Other Ambulatory Visit: Payer: Self-pay

## 2024-10-05 ENCOUNTER — Other Ambulatory Visit: Payer: Self-pay | Admitting: Family Medicine

## 2024-10-05 ENCOUNTER — Other Ambulatory Visit: Payer: Self-pay

## 2024-10-05 DIAGNOSIS — I5022 Chronic systolic (congestive) heart failure: Secondary | ICD-10-CM

## 2024-10-05 DIAGNOSIS — I10 Essential (primary) hypertension: Secondary | ICD-10-CM

## 2024-10-05 DIAGNOSIS — E876 Hypokalemia: Secondary | ICD-10-CM

## 2024-10-06 ENCOUNTER — Other Ambulatory Visit: Payer: Self-pay

## 2024-10-07 ENCOUNTER — Other Ambulatory Visit: Payer: Self-pay | Admitting: Family Medicine

## 2024-10-07 ENCOUNTER — Other Ambulatory Visit: Payer: Self-pay

## 2024-10-07 DIAGNOSIS — I10 Essential (primary) hypertension: Secondary | ICD-10-CM

## 2024-10-07 DIAGNOSIS — I5022 Chronic systolic (congestive) heart failure: Secondary | ICD-10-CM

## 2024-10-07 DIAGNOSIS — E876 Hypokalemia: Secondary | ICD-10-CM

## 2024-10-08 ENCOUNTER — Other Ambulatory Visit: Payer: Self-pay

## 2024-10-13 ENCOUNTER — Other Ambulatory Visit: Payer: Self-pay | Admitting: Family Medicine

## 2024-10-13 DIAGNOSIS — I5022 Chronic systolic (congestive) heart failure: Secondary | ICD-10-CM

## 2024-10-13 DIAGNOSIS — I10 Essential (primary) hypertension: Secondary | ICD-10-CM

## 2024-10-20 ENCOUNTER — Other Ambulatory Visit: Payer: Self-pay

## 2024-10-21 ENCOUNTER — Other Ambulatory Visit: Payer: Self-pay | Admitting: Family Medicine

## 2024-10-21 ENCOUNTER — Other Ambulatory Visit: Payer: Self-pay

## 2024-10-21 DIAGNOSIS — I10 Essential (primary) hypertension: Secondary | ICD-10-CM

## 2024-10-21 DIAGNOSIS — I5022 Chronic systolic (congestive) heart failure: Secondary | ICD-10-CM

## 2024-10-27 ENCOUNTER — Other Ambulatory Visit: Payer: Self-pay

## 2024-11-02 ENCOUNTER — Other Ambulatory Visit: Payer: Self-pay | Admitting: Family Medicine

## 2024-11-02 DIAGNOSIS — I5022 Chronic systolic (congestive) heart failure: Secondary | ICD-10-CM

## 2024-11-02 DIAGNOSIS — E876 Hypokalemia: Secondary | ICD-10-CM

## 2024-11-02 DIAGNOSIS — I10 Essential (primary) hypertension: Secondary | ICD-10-CM

## 2024-11-03 ENCOUNTER — Other Ambulatory Visit: Payer: Self-pay

## 2024-11-03 ENCOUNTER — Other Ambulatory Visit: Payer: Self-pay | Admitting: Family Medicine

## 2024-11-11 ENCOUNTER — Telehealth: Payer: Self-pay

## 2024-11-11 ENCOUNTER — Encounter: Payer: Self-pay | Admitting: Cardiovascular Disease

## 2024-11-11 DIAGNOSIS — I10 Essential (primary) hypertension: Secondary | ICD-10-CM

## 2024-11-11 DIAGNOSIS — I5022 Chronic systolic (congestive) heart failure: Secondary | ICD-10-CM

## 2024-11-11 DIAGNOSIS — E785 Hyperlipidemia, unspecified: Secondary | ICD-10-CM

## 2024-11-11 NOTE — Telephone Encounter (Signed)
 Copied from CRM #[id number]. Topic: Clinical - Medication Question >> Nov 11, 2024  9:18 AM Burnard DEL wrote: Reason for CRM: Patient is scheduled for CPE on 11/19/2024.He would like to know if he could have a refill on his medications until he comes in for that appointment.He stated that he is completely out.   Carvedilol  25 mg Oral Daily  Furosemide  40 mg Oral Daily  Sacubitril -Valsartan    [facility] - [facility]  Phone: [phone] Fax: [phone]

## 2024-11-11 NOTE — Telephone Encounter (Signed)
 Called pt and advised he needs to reach out to prescribing provider for these as cardiology should be managing these medications. Pt verbalized understanding

## 2024-11-12 ENCOUNTER — Telehealth: Payer: Self-pay | Admitting: Pharmacy Technician

## 2024-11-12 ENCOUNTER — Other Ambulatory Visit (HOSPITAL_COMMUNITY): Payer: Self-pay

## 2024-11-12 ENCOUNTER — Other Ambulatory Visit: Payer: Self-pay

## 2024-11-12 MED ORDER — FUROSEMIDE 40 MG PO TABS
40.0000 mg | ORAL_TABLET | Freq: Every day | ORAL | 0 refills | Status: AC
  Filled 2024-11-12 (×3): qty 30, 30d supply, fill #0
  Filled 2024-11-12: qty 90, 90d supply, fill #0
  Filled 2024-12-08: qty 30, 30d supply, fill #1

## 2024-11-12 MED ORDER — DAPAGLIFLOZIN PROPANEDIOL 5 MG PO TABS
5.0000 mg | ORAL_TABLET | Freq: Every day | ORAL | 0 refills | Status: AC
  Filled 2024-11-12: qty 30, 30d supply, fill #0
  Filled 2024-11-12: qty 90, 90d supply, fill #0
  Filled 2024-11-12 (×2): qty 30, 30d supply, fill #0
  Filled 2024-11-23: qty 90, 90d supply, fill #0
  Filled 2024-11-24 – 2024-12-17 (×3): qty 30, 30d supply, fill #0

## 2024-11-12 MED ORDER — ISOSORB DINITRATE-HYDRALAZINE 20-37.5 MG PO TABS
1.0000 | ORAL_TABLET | Freq: Three times a day (TID) | ORAL | 0 refills | Status: AC
  Filled 2024-11-12: qty 270, 90d supply, fill #0
  Filled 2024-11-12 (×3): qty 90, 30d supply, fill #0
  Filled 2024-12-08: qty 90, 30d supply, fill #1

## 2024-11-12 MED ORDER — CARVEDILOL 25 MG PO TABS
37.5000 mg | ORAL_TABLET | Freq: Two times a day (BID) | ORAL | 0 refills | Status: AC
  Filled 2024-11-12 (×2): qty 90, 30d supply, fill #0
  Filled 2024-11-12: qty 270, 90d supply, fill #0
  Filled 2024-11-12: qty 90, 30d supply, fill #0
  Filled 2024-12-08: qty 90, 30d supply, fill #1

## 2024-11-12 MED ORDER — ATORVASTATIN CALCIUM 40 MG PO TABS
40.0000 mg | ORAL_TABLET | Freq: Every day | ORAL | 0 refills | Status: AC
  Filled 2024-11-12 (×2): qty 30, 30d supply, fill #0
  Filled 2024-11-12: qty 90, 90d supply, fill #0
  Filled 2024-11-12: qty 30, 30d supply, fill #0
  Filled 2024-12-08: qty 30, 30d supply, fill #1

## 2024-11-12 MED ORDER — SACUBITRIL-VALSARTAN 49-51 MG PO TABS
1.0000 | ORAL_TABLET | Freq: Two times a day (BID) | ORAL | 0 refills | Status: DC
  Filled 2024-11-12 (×4): qty 60, 30d supply, fill #0

## 2024-11-12 NOTE — Telephone Encounter (Signed)
 144.46 for generic for 30 days  Insurance doesn't pay for brand    Would have to change to something else since no assistance for commercial

## 2024-11-13 ENCOUNTER — Other Ambulatory Visit: Payer: Self-pay

## 2024-11-13 MED ORDER — VALSARTAN 320 MG PO TABS
320.0000 mg | ORAL_TABLET | Freq: Every day | ORAL | 0 refills | Status: AC
  Filled 2024-11-13: qty 30, 30d supply, fill #0

## 2024-11-13 NOTE — Telephone Encounter (Signed)
 Rather than losartan , if we cannot do Entresto , please switch to Entresto  320 mg once daily.

## 2024-11-13 NOTE — Telephone Encounter (Signed)
 Sorry, I meant valsartan  320 mg daily

## 2024-11-17 ENCOUNTER — Other Ambulatory Visit: Payer: Self-pay

## 2024-11-19 ENCOUNTER — Encounter: Payer: Self-pay | Admitting: Family Medicine

## 2024-11-19 ENCOUNTER — Ambulatory Visit: Admitting: Family Medicine

## 2024-11-19 VITALS — BP 132/86 | HR 98 | Temp 97.6°F | Ht 73.0 in | Wt 348.0 lb

## 2024-11-19 DIAGNOSIS — R7303 Prediabetes: Secondary | ICD-10-CM

## 2024-11-19 DIAGNOSIS — G4733 Obstructive sleep apnea (adult) (pediatric): Secondary | ICD-10-CM | POA: Diagnosis not present

## 2024-11-19 DIAGNOSIS — I10 Essential (primary) hypertension: Secondary | ICD-10-CM

## 2024-11-19 DIAGNOSIS — Z0000 Encounter for general adult medical examination without abnormal findings: Secondary | ICD-10-CM

## 2024-11-19 DIAGNOSIS — Z0001 Encounter for general adult medical examination with abnormal findings: Secondary | ICD-10-CM

## 2024-11-19 DIAGNOSIS — I5022 Chronic systolic (congestive) heart failure: Secondary | ICD-10-CM

## 2024-11-19 DIAGNOSIS — E785 Hyperlipidemia, unspecified: Secondary | ICD-10-CM

## 2024-11-19 LAB — LIPID PANEL
Cholesterol: 147 mg/dL (ref 28–200)
HDL: 35.6 mg/dL — ABNORMAL LOW (ref >39.00)
LDL Cholesterol: 84 mg/dL (ref 10–99)
NonHDL: 111.02
Total CHOL/HDL Ratio: 4
Triglycerides: 136 mg/dL (ref 10.0–149.0)
VLDL: 27.2 mg/dL (ref 0.0–40.0)

## 2024-11-19 LAB — COMPREHENSIVE METABOLIC PANEL WITH GFR
ALT: 19 U/L (ref 3–53)
AST: 16 U/L (ref 5–37)
Albumin: 4 g/dL (ref 3.5–5.2)
Alkaline Phosphatase: 73 U/L (ref 39–117)
BUN: 20 mg/dL (ref 6–23)
CO2: 29 meq/L (ref 19–32)
Calcium: 9.1 mg/dL (ref 8.4–10.5)
Chloride: 106 meq/L (ref 96–112)
Creatinine, Ser: 1.67 mg/dL — ABNORMAL HIGH (ref 0.40–1.50)
GFR: 50 mL/min — ABNORMAL LOW (ref >60.00)
Glucose, Bld: 88 mg/dL (ref 70–99)
Potassium: 3.7 meq/L (ref 3.5–5.1)
Sodium: 141 meq/L (ref 135–145)
Total Bilirubin: 0.5 mg/dL (ref 0.2–1.2)
Total Protein: 7.1 g/dL (ref 6.0–8.3)

## 2024-11-19 LAB — CBC WITH DIFFERENTIAL/PLATELET
Basophils Absolute: 0.1 K/uL (ref 0.0–0.1)
Basophils Relative: 1.7 % (ref 0.0–3.0)
Eosinophils Absolute: 0.2 K/uL (ref 0.0–0.7)
Eosinophils Relative: 3.4 % (ref 0.0–5.0)
HCT: 43.2 % (ref 39.0–52.0)
Hemoglobin: 14.3 g/dL (ref 13.0–17.0)
Lymphocytes Relative: 32.4 % (ref 12.0–46.0)
Lymphs Abs: 1.5 K/uL (ref 0.7–4.0)
MCHC: 33.2 g/dL (ref 30.0–36.0)
MCV: 89.9 fl (ref 78.0–100.0)
Monocytes Absolute: 0.7 K/uL (ref 0.1–1.0)
Monocytes Relative: 13.7 % — ABNORMAL HIGH (ref 3.0–12.0)
Neutro Abs: 2.3 K/uL (ref 1.4–7.7)
Neutrophils Relative %: 48.8 % (ref 43.0–77.0)
Platelets: 246 K/uL (ref 150.0–400.0)
RBC: 4.81 Mil/uL (ref 4.22–5.81)
RDW: 14.2 % (ref 11.5–15.5)
WBC: 4.8 K/uL (ref 4.0–10.5)

## 2024-11-19 LAB — T4, FREE: Free T4: 0.82 ng/dL (ref 0.60–1.60)

## 2024-11-19 LAB — TSH: TSH: 1.54 u[IU]/mL (ref 0.35–5.50)

## 2024-11-19 LAB — HEMOGLOBIN A1C: Hgb A1c MFr Bld: 6 % (ref 4.6–6.5)

## 2024-11-19 NOTE — Progress Notes (Unsigned)
 Complete physical exam  Patient: Steve Carrillo   DOB: 1981-01-18   42 y.o. Male  MRN: [id number]  Subjective:    Chief Complaint  Patient presents with   Annual Exam   He is here for a complete physical exam.   Discussed the use of AI scribe software for clinical note transcription with the patient, who gave verbal consent to proceed.  History of Present Illness Steve Carrillo is a 43 year old male who presents for an annual physical exam and preventive healthcare visit.  Prediabetes - Last hemoglobin A1c in January was 6.3% - Consumes sweets and carbohydrates occasionally  Obesity and weight management - Morbid obesity - Weight gain attributed to low physical activity - Currently not working  Cardiovascular disease - Hypertension, congestive heart failure, and cardiomyopathy - Takes spironolactone , furosemide , and isosorbide  dinitrate with good adherence  Chronic kidney disease - Stage 2 chronic kidney disease - GFR 50 and creatinine 1.68 in April 2025  Sleep apnea - Diagnosed by sleep study in 2017 - Not using CPAP  Hyperlipidemia - January lipid panel: LDL 87, HDL 35 - Takes atorvastatin  nightly  Gastrointestinal symptoms - No changes in bowel habits - No blood in stool - No current gastrointestinal symptoms    Health Maintenance  Topic Date Due   Pneumococcal Vaccine (1 of 2 - PCV) Never done   Hepatitis B Vaccine (1 of 3 - 19+ 3-dose series) Never done   HPV Vaccine (1 - 3-dose SCDM series) Never done   COVID-19 Vaccine (3 - 2025-26 season) 12/05/2024*   Flu Shot  03/03/2025*   DTaP/Tdap/Td vaccine (2 - Td or Tdap) 10/04/2027   Hepatitis C Screening  Completed   HIV Screening  Completed   Meningitis B Vaccine  Aged Out  *Topic was postponed. The date shown is not the original due date.     Depression screening:    11/19/2024   10:46 AM 12/28/2023   10:43 AM 09/28/2023    3:27 PM  Depression screen PHQ 2/9  Decreased Interest 0 0 0   Down, Depressed, Hopeless 0 0 0  PHQ - 2 Score 0 0 0   Anxiety Screening:    05/22/2023    9:21 AM 11/10/2022    9:12 AM 08/10/2022    9:11 AM 03/03/2021    9:04 AM  GAD 7 : Generalized Anxiety Score  Nervous, Anxious, on Edge 0 0 0 0  Control/stop worrying 0 1 1 1   Worry too much - different things 0 1 1 1   Trouble relaxing 0 0 0 0  Restless 0 0 0 0  Easily annoyed or irritable 0 1 1 0  Afraid - awful might happen 0 0 0 0  Total GAD 7 Score 0 3 3 2      Patient Care Team: Lendia Boby CROME, NP-C as PCP - General (Family Medicine) Croitoru, Jerel, MD as PCP - Cardiology (Cardiology)   Show/hide medication list[1]  Review of Systems  Constitutional:  Negative for chills, fever, malaise/fatigue and weight loss.  HENT:  Negative for congestion, ear pain, sinus pain and sore throat.   Eyes:  Negative for blurred vision, double vision and pain.  Respiratory:  Negative for cough, shortness of breath and wheezing.   Cardiovascular:  Negative for chest pain, palpitations and leg swelling.  Gastrointestinal:  Negative for abdominal pain, constipation, diarrhea, nausea and vomiting.  Genitourinary:  Negative for dysuria, frequency and urgency.  Musculoskeletal:  Negative for back pain, joint pain  and myalgias.  Skin:  Negative for rash.  Neurological:  Negative for dizziness, tingling, focal weakness and headaches.  Psychiatric/Behavioral:  Negative for depression. The patient is not nervous/anxious.        Objective:    BP 132/86   Pulse 98   Temp 97.6 F (36.4 C) (Temporal)   Ht 6' 1 (1.854 m)   Wt (!) 348 lb (157.9 kg)   SpO2 95%   BMI 45.91 kg/m  BP Readings from Last 3 Encounters:  11/19/24 132/86  08/08/24 (!) 146/112  02/15/24 116/86   Wt Readings from Last 3 Encounters:  11/19/24 (!) 348 lb (157.9 kg)  08/08/24 (!) 326 lb 15.1 oz (148.3 kg)  02/15/24 (!) 327 lb (148.3 kg)    Physical Exam Constitutional:      General: He is not in acute distress.     Appearance: He is obese. He is not ill-appearing.  HENT:     Right Ear: Tympanic membrane, ear canal and external ear normal.     Left Ear: Tympanic membrane, ear canal and external ear normal.     Nose: Nose normal.     Mouth/Throat:     Mouth: Mucous membranes are moist.     Pharynx: Oropharynx is clear.  Eyes:     Extraocular Movements: Extraocular movements intact.     Conjunctiva/sclera: Conjunctivae normal.     Pupils: Pupils are equal, round, and reactive to light.  Neck:     Thyroid : No thyroid  mass, thyromegaly or thyroid  tenderness.  Cardiovascular:     Rate and Rhythm: Normal rate and regular rhythm.     Pulses: Normal pulses.     Heart sounds: Normal heart sounds.  Pulmonary:     Effort: Pulmonary effort is normal.     Breath sounds: Normal breath sounds.  Abdominal:     General: Bowel sounds are normal. There is no distension.     Palpations: Abdomen is soft.     Tenderness: There is no abdominal tenderness. There is no right CVA tenderness, left CVA tenderness, guarding or rebound.  Musculoskeletal:        General: Normal range of motion.     Cervical back: Normal range of motion and neck supple. No tenderness.     Right lower leg: No edema.     Left lower leg: No edema.  Lymphadenopathy:     Cervical: No cervical adenopathy.  Skin:    General: Skin is warm and dry.     Findings: No lesion or rash.  Neurological:     General: No focal deficit present.     Mental Status: He is alert and oriented to person, place, and time.     Cranial Nerves: No cranial nerve deficit.     Sensory: No sensory deficit.     Motor: No weakness.  Psychiatric:        Mood and Affect: Mood normal.        Behavior: Behavior normal.        Thought Content: Thought content normal.      Results for orders placed or performed in visit on 11/19/24  T4, free  Result Value Ref Range   Free T4 0.82 0.60 - 1.60 ng/dL  TSH  Result Value Ref Range   TSH 1.54 0.35 - 5.50 uIU/mL  Lipid  panel  Result Value Ref Range   Cholesterol 147 28 - 200 mg/dL   Triglycerides 863.9 89.9 - 149.0 mg/dL   HDL 64.39 (L) >60.99 mg/dL  VLDL 27.2 0.0 - 40.0 mg/dL   LDL Cholesterol 84 10 - 99 mg/dL   Total CHOL/HDL Ratio 4    NonHDL 111.02   Hemoglobin A1c  Result Value Ref Range   Hgb A1c MFr Bld 6.0 4.6 - 6.5 %  Comprehensive metabolic panel with GFR  Result Value Ref Range   Sodium 141 135 - 145 mEq/L   Potassium 3.7 3.5 - 5.1 mEq/L   Chloride 106 96 - 112 mEq/L   CO2 29 19 - 32 mEq/L   Glucose, Bld 88 70 - 99 mg/dL   BUN 20 6 - 23 mg/dL   Creatinine, Ser 8.32 (H) 0.40 - 1.50 mg/dL   Total Bilirubin 0.5 0.2 - 1.2 mg/dL   Alkaline Phosphatase 73 39 - 117 U/L   AST 16 5 - 37 U/L   ALT 19 3 - 53 U/L   Total Protein 7.1 6.0 - 8.3 g/dL   Albumin 4.0 3.5 - 5.2 g/dL   GFR 49.99 (L) >39.99 mL/min   Calcium  9.1 8.4 - 10.5 mg/dL  CBC with Differential/Platelet  Result Value Ref Range   WBC 4.8 4.0 - 10.5 K/uL   RBC 4.81 4.22 - 5.81 Mil/uL   Hemoglobin 14.3 13.0 - 17.0 g/dL   HCT 56.7 60.9 - 47.9 %   MCV 89.9 78.0 - 100.0 fl   MCHC 33.2 30.0 - 36.0 g/dL   RDW 85.7 88.4 - 84.4 %   Platelets 246.0 150.0 - 400.0 K/uL   Neutrophils Relative % 48.8 43.0 - 77.0 %   Lymphocytes Relative 32.4 12.0 - 46.0 %   Monocytes Relative 13.7 (H) 3.0 - 12.0 %   Eosinophils Relative 3.4 0.0 - 5.0 %   Basophils Relative 1.7 0.0 - 3.0 %   Neutro Abs 2.3 1.4 - 7.7 K/uL   Lymphs Abs 1.5 0.7 - 4.0 K/uL   Monocytes Absolute 0.7 0.1 - 1.0 K/uL   Eosinophils Absolute 0.2 0.0 - 0.7 K/uL   Basophils Absolute 0.1 0.0 - 0.1 K/uL      Assessment & Plan:    Routine Health Maintenance and Physical Exam Problem List Items Addressed This Visit     Chronic systolic congestive heart failure (HCC)   Essential hypertension, malignant   Relevant Orders   CBC with Differential/Platelet (Completed)   Comprehensive metabolic panel with GFR (Completed)   TSH (Completed)   T4, free (Completed)    Hyperlipidemia   Relevant Orders   Lipid panel (Completed)   Morbid obesity (HCC)   Relevant Orders   TSH (Completed)   T4, free (Completed)   OSA (obstructive sleep apnea)   Prediabetes   Relevant Orders   Hemoglobin A1c (Completed)   TSH (Completed)   T4, free (Completed)   Other Visit Diagnoses       Encounter for general adult medical examination with abnormal findings    -  Primary       Assessment and Plan Assessment & Plan Prediabetes Previous A1c of 6.3% in January. Current A1c will be checked to assess for progression to diabetes. If A1c is 6.5% or higher, it will confirm diabetes, which may influence insurance coverage for GLP-1 receptor agonists. - Ordered A1c test to screen for diabetes  Morbid obesity Contributing to multiple comorbidities including sleep apnea and heart failure. Discussed potential use of GLP-1 receptor agonists for weight loss, which may also improve blood pressure, cholesterol, and sleep apnea. Insurance coverage for GLP-1 receptor agonists may be influenced by diabetes status. - Will  consider GLP-1 receptor agonist therapy pending A1c results and insurance approval  Chronic systolic heart failure Managed with multiple medications. Overdue for cardiology follow-up, which is scheduled for February 4th. Discussed the importance of treating sleep apnea to prevent exacerbation of heart failure. - Ensure cardiology follow-up on February 4th  Essential hypertension Blood pressure control is crucial due to comorbidities including heart failure and kidney disease.  Hyperlipidemia Managed with atorvastatin . Cholesterol levels will not be checked today due to recent food intake, but will be monitored by cardiology. - Continue atorvastatin  therapy - Will defer cholesterol check to cardiology follow-up  Obstructive sleep apnea Diagnosed in 2017, currently untreated. Discussed potential complications of untreated sleep apnea, including heart failure,  dementia, and erectile dysfunction. Plan to review previous sleep study results and discuss treatment options with cardiology. - Will review previous sleep study results - Will discuss sleep apnea management with cardiology  Chronic kidney disease stage 2 GFR of 50 and creatinine of 1.68 in April 2025. Monitoring kidney function is essential due to comorbidities. - Continue monitoring kidney function  General Health Maintenance Discussed preventive healthcare measures including vaccinations and screenings. No current need for colonoscopy due to lack of symptoms and family history of colon cancer. No alcohol or recreational drug use. Regular dental and eye check-ups are encouraged. - Will consider pneumonia vaccine - Encouraged regular dental and eye check-ups  An annual physical exam No acute issues reported. Discussed general health and lifestyle, including weight management and medication adherence. - Continue current medication regimen - Encouraged healthy lifestyle choices     Return for pending labs.     Boby Mackintosh, NP-C      [1]  Outpatient Medications Prior to Visit  Medication Sig   atorvastatin  (LIPITOR) 40 MG tablet Take 1 tablet (40 mg total) by mouth daily. Please call [phone] to schedule appointment for future refills.   carvedilol  (COREG ) 25 MG tablet Take 1.5 tablets (37.5 mg total) by mouth 2 (two) times daily with a meal. Please call [phone] to schedule appointment for future refills.   cyclobenzaprine  (FLEXERIL ) 10 MG tablet Take 0.5-1 tablets (5-10 mg total) by mouth 2 (two) times daily as needed for muscle spasms.   furosemide  (LASIX ) 40 MG tablet Take 1 tablet (40 mg total) by mouth daily. Please call [phone] to schedule appointment for future refills.   isosorbide -hydrALAZINE  (BIDIL ) 20-37.5 MG tablet Take 1 tablet by mouth 3 (three) times daily. Please call [phone] to schedule appointment for future refills.   methocarbamol  (ROBAXIN )  500 MG tablet Take 2 tablets (1,000 mg total) by mouth every 8 (eight) hours as needed for muscle spasms.   naproxen  (NAPROSYN ) 375 MG tablet Take 1 tablet (375 mg total) by mouth 2 (two) times daily with a meal.   valsartan  (DIOVAN ) 320 MG tablet Take 1 tablet (320 mg total) by mouth daily.   Vitamin D , Ergocalciferol , (DRISDOL ) 1.25 MG (50000 UNIT) CAPS capsule Take 1 capsule (50,000 Units total) by mouth once a week.   dapagliflozin  propanediol (FARXIGA ) 5 MG TABS tablet Take 1 tablet (5 mg total) by mouth daily. Please call [phone] to schedule appointment for future refills. (Patient not taking: Reported on 11/19/2024)   spironolactone  (ALDACTONE ) 25 MG tablet Take 1 tablet (25 mg total) by mouth daily. Needs appointment for further refills.   spironolactone  (ALDACTONE ) 50 MG tablet Take 1 tablet (50 mg total) by mouth daily.   No facility-administered medications prior to visit.

## 2024-11-19 NOTE — Patient Instructions (Signed)

## 2024-11-23 ENCOUNTER — Other Ambulatory Visit: Payer: Self-pay | Admitting: Family Medicine

## 2024-11-23 ENCOUNTER — Ambulatory Visit: Payer: Self-pay | Admitting: Family Medicine

## 2024-11-23 DIAGNOSIS — I5022 Chronic systolic (congestive) heart failure: Secondary | ICD-10-CM

## 2024-11-23 DIAGNOSIS — I10 Essential (primary) hypertension: Secondary | ICD-10-CM

## 2024-11-23 DIAGNOSIS — T502X5A Adverse effect of carbonic-anhydrase inhibitors, benzothiadiazides and other diuretics, initial encounter: Secondary | ICD-10-CM

## 2024-11-23 MED ORDER — ZEPBOUND 2.5 MG/0.5ML ~~LOC~~ SOAJ
2.5000 mg | SUBCUTANEOUS | 1 refills | Status: AC
  Filled 2024-11-23 – 2024-12-08 (×2): qty 2, 28d supply, fill #0

## 2024-11-23 NOTE — Progress Notes (Signed)
 Sent in Zepbound  due to morbid obesity, sleep apnea, heart failure. If medication affordable, I would like for him to follow up in office 6 weeks after starting it.

## 2024-11-24 ENCOUNTER — Other Ambulatory Visit: Payer: Self-pay

## 2024-11-25 ENCOUNTER — Other Ambulatory Visit: Payer: Self-pay

## 2024-11-26 ENCOUNTER — Other Ambulatory Visit: Payer: Self-pay

## 2024-11-28 ENCOUNTER — Other Ambulatory Visit: Payer: Self-pay

## 2024-12-03 ENCOUNTER — Other Ambulatory Visit: Payer: Self-pay

## 2024-12-05 ENCOUNTER — Other Ambulatory Visit: Payer: Self-pay

## 2024-12-08 ENCOUNTER — Other Ambulatory Visit: Payer: Self-pay

## 2024-12-08 DIAGNOSIS — I10 Essential (primary) hypertension: Secondary | ICD-10-CM

## 2024-12-08 DIAGNOSIS — E876 Hypokalemia: Secondary | ICD-10-CM

## 2024-12-08 DIAGNOSIS — I5022 Chronic systolic (congestive) heart failure: Secondary | ICD-10-CM

## 2024-12-17 ENCOUNTER — Other Ambulatory Visit: Payer: Self-pay

## 2024-12-22 ENCOUNTER — Other Ambulatory Visit: Payer: Self-pay

## 2025-01-07 ENCOUNTER — Ambulatory Visit: Admitting: Physician Assistant
